# Patient Record
Sex: Female | Born: 1935 | Race: White | Hispanic: No | Marital: Married
Health system: Midwestern US, Community
[De-identification: ages and names within clinical notes are randomized; demographics above are authoritative.]

## PROBLEM LIST (undated history)

## (undated) DIAGNOSIS — T753XXA Motion sickness, initial encounter: Secondary | ICD-10-CM

## (undated) DIAGNOSIS — K219 Gastro-esophageal reflux disease without esophagitis: Secondary | ICD-10-CM

## (undated) DIAGNOSIS — F32A Depression, unspecified: Secondary | ICD-10-CM

## (undated) DIAGNOSIS — T4145XA Adverse effect of unspecified anesthetic, initial encounter: Secondary | ICD-10-CM

## (undated) DIAGNOSIS — I10 Essential (primary) hypertension: Secondary | ICD-10-CM

## (undated) DIAGNOSIS — Z8719 Personal history of other diseases of the digestive system: Secondary | ICD-10-CM

## (undated) DIAGNOSIS — K589 Irritable bowel syndrome without diarrhea: Secondary | ICD-10-CM

## (undated) DIAGNOSIS — D509 Iron deficiency anemia, unspecified: Secondary | ICD-10-CM

## (undated) DIAGNOSIS — M1990 Unspecified osteoarthritis, unspecified site: Secondary | ICD-10-CM

## (undated) DIAGNOSIS — T8859XA Other complications of anesthesia, initial encounter: Secondary | ICD-10-CM

## (undated) DIAGNOSIS — B029 Zoster without complications: Secondary | ICD-10-CM

## (undated) DIAGNOSIS — H409 Unspecified glaucoma: Secondary | ICD-10-CM

## (undated) DIAGNOSIS — M810 Age-related osteoporosis without current pathological fracture: Secondary | ICD-10-CM

## (undated) DIAGNOSIS — C801 Malignant (primary) neoplasm, unspecified: Secondary | ICD-10-CM

## (undated) DIAGNOSIS — E7800 Pure hypercholesterolemia, unspecified: Secondary | ICD-10-CM

## (undated) DIAGNOSIS — N6009 Solitary cyst of unspecified breast: Secondary | ICD-10-CM

## (undated) DIAGNOSIS — F329 Major depressive disorder, single episode, unspecified: Secondary | ICD-10-CM

## (undated) DIAGNOSIS — R519 Headache, unspecified: Secondary | ICD-10-CM

## (undated) DIAGNOSIS — Z923 Personal history of irradiation: Secondary | ICD-10-CM

## (undated) DIAGNOSIS — Z972 Presence of dental prosthetic device (complete) (partial): Secondary | ICD-10-CM

## (undated) DIAGNOSIS — M2550 Pain in unspecified joint: Secondary | ICD-10-CM

## (undated) DIAGNOSIS — R51 Headache: Secondary | ICD-10-CM

## (undated) HISTORY — PX: EXCISION VAGINAL CYST: SHX5825

## (undated) HISTORY — DX: Solitary cyst of unspecified breast: N60.09

## (undated) HISTORY — PX: CARDIAC CATHETERIZATION: SHX172

## (undated) HISTORY — DX: Zoster without complications: B02.9

## (undated) HISTORY — PX: HAND SURGERY: SHX662

## (undated) HISTORY — DX: Gastro-esophageal reflux disease without esophagitis: K21.9

## (undated) HISTORY — DX: Irritable bowel syndrome, unspecified: K58.9

## (undated) HISTORY — DX: Essential (primary) hypertension: I10

## (undated) HISTORY — PX: COLONOSCOPY: SHX174

## (undated) HISTORY — DX: Pain in unspecified joint: M25.50

## (undated) HISTORY — DX: Unspecified glaucoma: H40.9

## (undated) HISTORY — PX: CATARACT EXTRACTION W/ INTRAOCULAR LENS  IMPLANT, BILATERAL: SHX1307

## (undated) HISTORY — DX: Malignant (primary) neoplasm, unspecified: C80.1

---

## 1974-12-26 HISTORY — PX: ABDOMINAL HYSTERECTOMY: SHX81

## 2004-10-05 ENCOUNTER — Ambulatory Visit: Payer: Self-pay | Admitting: Family Medicine

## 2005-11-03 ENCOUNTER — Ambulatory Visit: Payer: Self-pay | Admitting: Family Medicine

## 2006-07-25 ENCOUNTER — Other Ambulatory Visit: Payer: Self-pay

## 2006-07-25 ENCOUNTER — Emergency Department: Payer: Self-pay | Admitting: Emergency Medicine

## 2006-11-06 ENCOUNTER — Ambulatory Visit: Payer: Self-pay | Admitting: Family Medicine

## 2007-11-13 ENCOUNTER — Ambulatory Visit: Payer: Self-pay | Admitting: Family Medicine

## 2008-12-03 ENCOUNTER — Ambulatory Visit: Payer: Self-pay | Admitting: Family Medicine

## 2008-12-15 ENCOUNTER — Ambulatory Visit: Payer: Self-pay | Admitting: Family Medicine

## 2009-01-15 ENCOUNTER — Ambulatory Visit: Payer: Self-pay | Admitting: Family Medicine

## 2009-07-31 ENCOUNTER — Ambulatory Visit: Payer: Self-pay | Admitting: Family Medicine

## 2009-11-10 ENCOUNTER — Ambulatory Visit: Payer: Self-pay | Admitting: Unknown Physician Specialty

## 2010-01-20 ENCOUNTER — Ambulatory Visit: Payer: Self-pay | Admitting: Family Medicine

## 2010-09-16 ENCOUNTER — Ambulatory Visit: Payer: Self-pay | Admitting: Family Medicine

## 2011-05-11 ENCOUNTER — Ambulatory Visit: Payer: Self-pay | Admitting: Family Medicine

## 2012-04-13 ENCOUNTER — Ambulatory Visit: Payer: Self-pay | Admitting: Family Medicine

## 2012-05-22 ENCOUNTER — Ambulatory Visit: Payer: Self-pay | Admitting: Family Medicine

## 2012-05-23 ENCOUNTER — Ambulatory Visit: Payer: Self-pay | Admitting: Family Medicine

## 2012-09-20 ENCOUNTER — Ambulatory Visit: Payer: Self-pay | Admitting: Family Medicine

## 2012-12-26 DIAGNOSIS — Z923 Personal history of irradiation: Secondary | ICD-10-CM

## 2012-12-26 DIAGNOSIS — C801 Malignant (primary) neoplasm, unspecified: Secondary | ICD-10-CM

## 2012-12-26 HISTORY — PX: BREAST LUMPECTOMY: SHX2

## 2012-12-26 HISTORY — PX: MM BREAST STEREO BX*R*R/S: HXRAD496

## 2012-12-26 HISTORY — DX: Personal history of irradiation: Z92.3

## 2012-12-26 HISTORY — DX: Malignant (primary) neoplasm, unspecified: C80.1

## 2012-12-26 HISTORY — PX: BREAST BIOPSY: SHX20

## 2013-01-23 ENCOUNTER — Ambulatory Visit: Payer: Self-pay | Admitting: Ophthalmology

## 2013-01-23 DIAGNOSIS — I10 Essential (primary) hypertension: Secondary | ICD-10-CM

## 2013-01-25 ENCOUNTER — Ambulatory Visit: Payer: Self-pay | Admitting: Ophthalmology

## 2013-03-18 ENCOUNTER — Ambulatory Visit: Payer: Self-pay | Admitting: Gastroenterology

## 2013-04-11 ENCOUNTER — Ambulatory Visit: Payer: Self-pay | Admitting: Cardiothoracic Surgery

## 2013-04-25 ENCOUNTER — Ambulatory Visit: Payer: Self-pay | Admitting: Cardiothoracic Surgery

## 2013-05-23 ENCOUNTER — Ambulatory Visit: Payer: Self-pay | Admitting: Family Medicine

## 2013-06-03 ENCOUNTER — Ambulatory Visit: Payer: Self-pay | Admitting: Family Medicine

## 2013-06-13 ENCOUNTER — Ambulatory Visit (INDEPENDENT_AMBULATORY_CARE_PROVIDER_SITE_OTHER): Payer: Medicare Other | Admitting: General Surgery

## 2013-06-13 ENCOUNTER — Other Ambulatory Visit: Payer: Self-pay

## 2013-06-13 ENCOUNTER — Encounter: Payer: Self-pay | Admitting: General Surgery

## 2013-06-13 VITALS — BP 122/62 | HR 62 | Resp 16 | Ht 63.0 in | Wt 164.0 lb

## 2013-06-13 DIAGNOSIS — N63 Unspecified lump in unspecified breast: Secondary | ICD-10-CM

## 2013-06-13 NOTE — Patient Instructions (Addendum)
The stereotactic procedure was reviewed with the patient. The potential for bleeding, infection and pain was reviewed. At this time, the benefits outweigh the risk, and the patient is amenable to proceed.  If pathology normal follow up in 6 months with right mammogram and office visit.  Patient has been scheduled for a right breast stereotactic biopsy at [facility] for 06-24-13 at 3 pm. She will check-in at the [facility] at 2:30 pm. This patient is aware of date, time, and instructions. Patient verbalizes understanding.

## 2013-06-13 NOTE — Progress Notes (Signed)
Patient ID: Abigail Shaw, female   DOB: [birth year], 78 y.o.   MRN: [id number]  Chief Complaint  Patient presents with  . Breast Problem    new patient category 4 mammogram    HPI Abigail Shaw is a 78 y.o. female who presents for a breast evaluation. The most recent mammogram was done May 23 2013 and June 03 2013 with added views .  Patient states she "feels a cyst" for many years and has not changed in the right breast. Patient does perform regular self breast checks and gets regular mammograms done. Her mother has a history of breast cancer age 74 passed at age 1. Left ankle tendonitis and wearing a support boots.   HPI  Past Medical History  Diagnosis Date  . Hypertension   . IBS (irritable bowel syndrome)   . Breast cyst   . GERD (gastroesophageal reflux disease)   . Glaucoma   . Joint pain     Past Surgical History  Procedure Laterality Date  . Abdominal hysterectomy  1976    Family History  Problem Relation Age of Onset  . Cancer Mother     age 77 breast  . Cancer Brother     prostate/lung/ liver /spleen    Social History History  Substance Use Topics  . Smoking status: Never Smoker   . Smokeless tobacco: Never Used  . Alcohol Use: No    No Known Allergies  Current Outpatient Prescriptions  Medication Sig Dispense Refill  . amLODipine (NORVASC) 2.5 MG tablet Take 2.5 mg by mouth daily.      Marland Kitchen FLUoxetine (PROZAC) 20 MG capsule Take 20 mg by mouth daily.      . fluticasone (VERAMYST) 27.5 MCG/SPRAY nasal spray Place 2 sprays into the nose daily.      . hydrochlorothiazide (MICROZIDE) 12.5 MG capsule Take 12.5 mg by mouth daily.      Marland Kitchen LORazepam (ATIVAN) 0.5 MG tablet Take 0.5 mg by mouth every 8 (eight) hours.      Marland Kitchen losartan (COZAAR) 50 MG tablet Take 50 mg by mouth daily.      . Multiple Vitamins-Minerals (BIOTECT PLUS PO) Take by mouth.      . RABEprazole (ACIPHEX) 20 MG tablet Take 20 mg by mouth daily.      . rosuvastatin (CRESTOR) 5 MG tablet  Take 5 mg by mouth daily.      Marland Kitchen tetrahydrozoline 0.05 % ophthalmic solution Place 1 drop into both eyes.       No current facility-administered medications for this visit.    Review of Systems Review of Systems  Constitutional: Negative.   Respiratory: Negative.   Cardiovascular: Negative.     Blood pressure 122/62, pulse 62, resp. rate 16, height 5\' 3"  (1.6 m), weight 164 lb (74.39 kg).  Physical Exam Physical Exam  Constitutional: She is oriented to person, place, and time. She appears well-developed and well-nourished.  Eyes: Conjunctivae are normal. No scleral icterus.  Neck: Neck supple. No thyromegaly present.  Cardiovascular: Normal rate and regular rhythm.   No murmur heard. Pulmonary/Chest: Effort normal and breath sounds normal. Right breast exhibits mass and tenderness. Right breast exhibits no inverted nipple, no nipple discharge and no skin change. Left breast exhibits tenderness. Left breast exhibits no inverted nipple, no mass, no nipple discharge and no skin change.  Lymphadenopathy:    She has no cervical adenopathy.    She has no axillary adenopathy.  Neurological: She is alert and oriented to person, place,  and time.  Skin: Skin is warm and dry.  mid upper outer quadrant right breast with 1 cm subtle thickening non focal tenderness bilateral breast  Data Reviewed Mammogram and ultrasound.  Ultrasound findings are vague.  Mammogram shows a more  apparent density in upper outer quadrant right breast.  Assessment   as noted above    Plan    Stereotatic right breast biopsy    Patient has been scheduled for a right breast stereotactic biopsy at [facility] for 06-24-13 at 3 pm. She will check-in at the [facility] at 2:30 pm. This patient is aware of date, time, and instructions. Patient verbalizes understanding.    Mansi Tokar G 06/13/2013, 7:29 PM

## 2013-06-24 ENCOUNTER — Other Ambulatory Visit: Payer: Self-pay | Admitting: General Surgery

## 2013-06-24 ENCOUNTER — Ambulatory Visit: Payer: Self-pay | Admitting: General Surgery

## 2013-06-24 DIAGNOSIS — R928 Other abnormal and inconclusive findings on diagnostic imaging of breast: Secondary | ICD-10-CM

## 2013-06-25 ENCOUNTER — Encounter: Payer: Self-pay | Admitting: General Surgery

## 2013-06-26 ENCOUNTER — Encounter: Payer: Self-pay | Admitting: General Surgery

## 2013-06-26 ENCOUNTER — Ambulatory Visit (INDEPENDENT_AMBULATORY_CARE_PROVIDER_SITE_OTHER): Payer: Medicare Other | Admitting: General Surgery

## 2013-06-26 VITALS — BP 120/74 | HR 78 | Resp 14 | Ht 63.0 in | Wt 167.0 lb

## 2013-06-26 DIAGNOSIS — C50411 Malignant neoplasm of upper-outer quadrant of right female breast: Secondary | ICD-10-CM

## 2013-06-26 DIAGNOSIS — C50419 Malignant neoplasm of upper-outer quadrant of unspecified female breast: Secondary | ICD-10-CM

## 2013-06-26 NOTE — Addendum Note (Signed)
Addended by: Kieth Brightly on: 06/26/2013 02:49 PM   Modules accepted: Orders

## 2013-06-26 NOTE — Progress Notes (Signed)
Patient ID: Abigail Shaw, female   DOB: [birth year], 78 y.o.   MRN: [id number] Pt had stereo biopsy of right breast density in UOQ 2 days ago. Path- invasive carcinoma with mucinous features. Pt here for discussion. Discussed in full roles of local treatment, mastectomy, node involvement and chemotharepy. Also advised on ER,PR and Her 2 neu. After discussion recommended lumpectomy with sentinel node biopsy. Procedure and risks explained. Possible axillary dissection also explained. Pt is agreeable.

## 2013-06-26 NOTE — Patient Instructions (Signed)
Surgery to be scheduled. Pt to come day before surgery for preop [place] to see if biopsy cavity is identifiable-if not will need wire loc with mammography

## 2013-06-27 ENCOUNTER — Encounter: Payer: Self-pay | Admitting: General Surgery

## 2013-06-27 ENCOUNTER — Ambulatory Visit: Payer: Self-pay | Admitting: General Surgery

## 2013-06-27 LAB — CBC WITH DIFFERENTIAL/PLATELET
Basophil #: 0 10*3/uL (ref 0.0–0.1)
Basophil %: 0.5 %
Eosinophil #: 0.1 10*3/uL (ref 0.0–0.7)
Eosinophil %: 1.7 %
HGB: 11.7 g/dL — ABNORMAL LOW (ref 12.0–16.0)
Lymphocyte #: 1.9 10*3/uL (ref 1.0–3.6)
MCH: 27 pg (ref 26.0–34.0)
MCHC: 34 g/dL (ref 32.0–36.0)
Monocyte %: 9 %
RBC: 4.31 10*6/uL (ref 3.80–5.20)
RDW: 15.7 % — ABNORMAL HIGH (ref 11.5–14.5)
WBC: 6.3 10*3/uL (ref 3.6–11.0)

## 2013-06-27 LAB — COMPREHENSIVE METABOLIC PANEL
Albumin: 3.5 g/dL (ref 3.4–5.0)
Alkaline Phosphatase: 84 U/L (ref 50–136)
Anion Gap: 6 — ABNORMAL LOW (ref 7–16)
Bilirubin,Total: 0.3 mg/dL (ref 0.2–1.0)
Calcium, Total: 9.1 mg/dL (ref 8.5–10.1)
Co2: 28 mmol/L (ref 21–32)
Osmolality: 264 (ref 275–301)
SGOT(AST): 22 U/L (ref 15–37)
SGPT (ALT): 27 U/L (ref 12–78)
Sodium: 132 mmol/L — ABNORMAL LOW (ref 136–145)
Total Protein: 7.2 g/dL (ref 6.4–8.2)

## 2013-07-01 ENCOUNTER — Encounter: Payer: Self-pay | Admitting: General Surgery

## 2013-07-01 ENCOUNTER — Ambulatory Visit (INDEPENDENT_AMBULATORY_CARE_PROVIDER_SITE_OTHER): Payer: Medicare Other | Admitting: General Surgery

## 2013-07-01 ENCOUNTER — Other Ambulatory Visit: Payer: Self-pay

## 2013-07-01 VITALS — BP 134/60 | HR 68 | Resp 12 | Ht 63.0 in | Wt 166.0 lb

## 2013-07-01 DIAGNOSIS — C50411 Malignant neoplasm of upper-outer quadrant of right female breast: Secondary | ICD-10-CM

## 2013-07-01 DIAGNOSIS — C50419 Malignant neoplasm of upper-outer quadrant of unspecified female breast: Secondary | ICD-10-CM

## 2013-07-01 LAB — CANCER ANTIGEN 27.29: CA 27.29: 15.6 U/mL (ref 0.0–38.6)

## 2013-07-01 NOTE — Progress Notes (Signed)
Patient ID: Abigail Shaw, female   DOB: 1935-10-18, 78 y.o.   MRN: [id number]  Chief Complaint  Patient presents with  . Other    breast ultrasound    HPI Abigail Shaw is a 78 y.o. female  here today for an pre op right  breast ultrasound. HPI  Past Medical History  Diagnosis Date  . Hypertension   . IBS (irritable bowel syndrome)   . Breast cyst   . GERD (gastroesophageal reflux disease)   . Glaucoma   . Joint pain     Past Surgical History  Procedure Laterality Date  . Abdominal hysterectomy  1976  . Mm breast stereo bx*r*r/s Right 2014    Family History  Problem Relation Age of Onset  . Cancer Mother     age 60 breast  . Cancer Brother     prostate/lung/ liver /spleen    Social History History  Substance Use Topics  . Smoking status: Never Smoker   . Smokeless tobacco: Never Used  . Alcohol Use: No    No Known Allergies  Current Outpatient Prescriptions  Medication Sig Dispense Refill  . amLODipine (NORVASC) 2.5 MG tablet Take 2.5 mg by mouth daily.      . dorzolamide-timolol (COSOPT) 22.3-6.8 MG/ML ophthalmic solution Place 1 drop into both eyes.      Marland Kitchen FLUoxetine (PROZAC) 20 MG capsule Take 40 mg by mouth daily.       . fluticasone (VERAMYST) 27.5 MCG/SPRAY nasal spray Place 2 sprays into the nose daily.      . hydrochlorothiazide (MICROZIDE) 12.5 MG capsule Take 12.5 mg by mouth daily.      Marland Kitchen LORazepam (ATIVAN) 0.5 MG tablet Take 0.5 mg by mouth every 8 (eight) hours.      Marland Kitchen losartan (COZAAR) 50 MG tablet Take 50 mg by mouth daily.      Marland Kitchen LUMIGAN 0.01 % SOLN Place 1 drop into both eyes.      . meloxicam (MOBIC) 7.5 MG tablet Take 7.5 mg by mouth daily.      . Multiple Vitamins-Minerals (BIOTECT PLUS PO) Take by mouth.      . RABEprazole (ACIPHEX) 20 MG tablet Take 20 mg by mouth daily.      . rosuvastatin (CRESTOR) 5 MG tablet Take 5 mg by mouth daily.      Marland Kitchen tetrahydrozoline 0.05 % ophthalmic solution Place 1 drop into both eyes.       No  current facility-administered medications for this visit.    Review of Systems Review of Systems  Constitutional: Negative.   Respiratory: Negative.     Blood pressure 134/60, pulse 68, resp. rate 12, height 5\' 3"  (1.6 m), weight 166 lb (75.297 kg).  Physical Exam Physical Exam  Constitutional: She is oriented to person, place, and time. She appears well-developed and well-nourished.  Neurological: She is alert and oriented to person, place, and time.  Skin: Skin is warm and dry.  Mild  Bruising UOQ right breast  Data Reviewed none  Assessment    Right breast ultrasound-biopsy cavity is well seen.    Plan    Patient to have a  right breast lumpectomy with sentinel node biopsy.Dose not need mammogram and  wire loc.        Abigail Shaw 07/01/2013, 8:21 AM

## 2013-07-01 NOTE — Patient Instructions (Addendum)
Patient to have an lumpectomy with sentinel node biopsy as planned.

## 2013-07-02 ENCOUNTER — Encounter: Payer: Self-pay | Admitting: General Surgery

## 2013-07-02 ENCOUNTER — Ambulatory Visit: Payer: Self-pay | Admitting: General Surgery

## 2013-07-02 DIAGNOSIS — C50419 Malignant neoplasm of upper-outer quadrant of unspecified female breast: Secondary | ICD-10-CM

## 2013-07-02 HISTORY — PX: BREAST SURGERY: SHX581

## 2013-07-03 ENCOUNTER — Encounter: Payer: Self-pay | Admitting: General Surgery

## 2013-07-03 LAB — PATHOLOGY REPORT

## 2013-07-04 ENCOUNTER — Encounter: Payer: Self-pay | Admitting: General Surgery

## 2013-07-05 LAB — PATHOLOGY REPORT

## 2013-07-09 ENCOUNTER — Ambulatory Visit: Payer: Medicare Other

## 2013-07-15 ENCOUNTER — Ambulatory Visit (INDEPENDENT_AMBULATORY_CARE_PROVIDER_SITE_OTHER): Payer: Medicare Other | Admitting: General Surgery

## 2013-07-15 ENCOUNTER — Encounter: Payer: Self-pay | Admitting: General Surgery

## 2013-07-15 VITALS — BP 132/78 | HR 66 | Resp 14 | Ht 63.0 in | Wt 169.0 lb

## 2013-07-15 DIAGNOSIS — C50419 Malignant neoplasm of upper-outer quadrant of unspecified female breast: Secondary | ICD-10-CM

## 2013-07-15 DIAGNOSIS — C50411 Malignant neoplasm of upper-outer quadrant of right female breast: Secondary | ICD-10-CM

## 2013-07-15 NOTE — Progress Notes (Signed)
Patient ID: Abigail Shaw, female   DOB: July 25, 1935, 78 y.o.   MRN: [id number]  Chief Complaint  Patient presents with  . Other    HPI Abigail Shaw is a 78 y.o. female here today for follow up from her right lumpectomy and sentinal biopsy .  HPI  Past Medical History  Diagnosis Date  . Hypertension   . IBS (irritable bowel syndrome)   . Breast cyst   . GERD (gastroesophageal reflux disease)   . Glaucoma   . Joint pain     Past Surgical History  Procedure Laterality Date  . Abdominal hysterectomy  1976  . Mm breast stereo bx*r*r/s Right 2014  . Breast surgery Right 2014    with sentinal biopsy    Family History  Problem Relation Age of Onset  . Cancer Mother     age 76 breast  . Cancer Brother     prostate/lung/ liver /spleen    Social History History  Substance Use Topics  . Smoking status: Never Smoker   . Smokeless tobacco: Never Used  . Alcohol Use: No    No Known Allergies  Current Outpatient Prescriptions  Medication Sig Dispense Refill  . amLODipine (NORVASC) 2.5 MG tablet Take 2.5 mg by mouth daily.      . dorzolamide-timolol (COSOPT) 22.3-6.8 MG/ML ophthalmic solution Place 1 drop into both eyes.      Marland Kitchen ESTRACE VAGINAL 0.1 MG/GM vaginal cream Place 1 Applicatorful vaginally every morning.      Marland Kitchen FLUoxetine (PROZAC) 20 MG capsule Take 40 mg by mouth daily.       . fluticasone (VERAMYST) 27.5 MCG/SPRAY nasal spray Place 2 sprays into the nose daily.      . hydrochlorothiazide (MICROZIDE) 12.5 MG capsule Take 12.5 mg by mouth daily.      Marland Kitchen LORazepam (ATIVAN) 0.5 MG tablet Take 0.5 mg by mouth every 8 (eight) hours.      Marland Kitchen losartan (COZAAR) 50 MG tablet Take 50 mg by mouth daily.      Marland Kitchen LUMIGAN 0.01 % SOLN Place 1 drop into both eyes.      . meloxicam (MOBIC) 7.5 MG tablet Take 7.5 mg by mouth daily.      . Multiple Vitamins-Minerals (BIOTECT PLUS PO) Take by mouth.      . RABEprazole (ACIPHEX) 20 MG tablet Take 20 mg by mouth daily.      .  rosuvastatin (CRESTOR) 5 MG tablet Take 5 mg by mouth daily.      Marland Kitchen tetrahydrozoline 0.05 % ophthalmic solution Place 1 drop into both eyes.      . traMADol (ULTRAM) 50 MG tablet Take 1 tablet by mouth as needed.       No current facility-administered medications for this visit.    Review of Systems Review of Systems  Constitutional: Negative.   Respiratory: Negative.   Cardiovascular: Negative.     Blood pressure 132/78, pulse 66, resp. rate 14, height 5\' 3"  (1.6 m), weight 169 lb (76.658 kg).  Physical Exam Physical Exam  Constitutional: She is oriented to person, place, and time. She appears well-developed and well-nourished.  Pulmonary/Chest: Breath sounds normal.  Neurological: She is alert and oriented to person, place, and time.  Skin: Skin is warm and dry.  Right breast lumpectomy and axillary incisions are clean.   Data Reviewed Path- invasive CA 1.5cm size. Anterior margin pos. SN neg. ER/PR pos, Her 2 neg.  Assessment    Needs excision anterior margin(skin). Discussed fully with  pt. Also will benefit with Oncotype.     Plan    Pt agreeable to plan as above.        Adalae Baysinger G 07/15/2013, 5:26 PM

## 2013-07-15 NOTE — Patient Instructions (Addendum)
Patient to return as schedule  

## 2013-07-18 ENCOUNTER — Encounter: Payer: Self-pay | Admitting: General Surgery

## 2013-07-18 ENCOUNTER — Ambulatory Visit: Payer: Self-pay | Admitting: General Surgery

## 2013-07-18 DIAGNOSIS — C50419 Malignant neoplasm of upper-outer quadrant of unspecified female breast: Secondary | ICD-10-CM

## 2013-07-18 HISTORY — PX: BREAST SURGERY: SHX581

## 2013-07-21 LAB — PATHOLOGY REPORT

## 2013-07-25 ENCOUNTER — Encounter: Payer: Self-pay | Admitting: General Surgery

## 2013-07-25 ENCOUNTER — Ambulatory Visit (INDEPENDENT_AMBULATORY_CARE_PROVIDER_SITE_OTHER): Payer: Medicare Other | Admitting: General Surgery

## 2013-07-25 VITALS — BP 162/82 | HR 84 | Resp 16 | Ht 63.0 in | Wt 167.0 lb

## 2013-07-25 DIAGNOSIS — C50411 Malignant neoplasm of upper-outer quadrant of right female breast: Secondary | ICD-10-CM

## 2013-07-25 DIAGNOSIS — C50419 Malignant neoplasm of upper-outer quadrant of unspecified female breast: Secondary | ICD-10-CM

## 2013-07-25 NOTE — Patient Instructions (Addendum)
Patient to see Dr. Rushie Chestnut at the [facility] on Monday, 07-29-13 at 1 pm.

## 2013-07-25 NOTE — Progress Notes (Signed)
Patient ID: Abigail Shaw, female   DOB: 01-04-1935, 78 y.o.   MRN: [id number]  Chief Complaint  Patient presents with  . Routine Post Op    HPI Abigail Shaw is a 78 y.o. female. Patient here today for follow up right lumpectomy rexcision done 07-18-13. Final margins are clear. She  States she had more pain yesterday in right postop area controlled with pain medication.  HPI  Past Medical History  Diagnosis Date  . Hypertension   . IBS (irritable bowel syndrome)   . Breast cyst   . GERD (gastroesophageal reflux disease)   . Glaucoma   . Joint pain     Past Surgical History  Procedure Laterality Date  . Abdominal hysterectomy  1976  . Mm breast stereo bx*r*r/s Right 2014  . Breast surgery Right 07-02-2013    with sentinal biopsy  . Breast surgery Right 07-18-13    Family History  Problem Relation Age of Onset  . Cancer Mother     age 75 breast  . Cancer Brother     prostate/lung/ liver /spleen    Social History History  Substance Use Topics  . Smoking status: Never Smoker   . Smokeless tobacco: Never Used  . Alcohol Use: No    No Known Allergies  Current Outpatient Prescriptions  Medication Sig Dispense Refill  . amLODipine (NORVASC) 2.5 MG tablet Take 2.5 mg by mouth daily.      . dorzolamide-timolol (COSOPT) 22.3-6.8 MG/ML ophthalmic solution Place 1 drop into both eyes.      Marland Kitchen FLUoxetine (PROZAC) 20 MG capsule Take 40 mg by mouth daily.       . fluticasone (FLONASE) 50 MCG/ACT nasal spray       . fluticasone (VERAMYST) 27.5 MCG/SPRAY nasal spray Place 2 sprays into the nose daily.      . hydrochlorothiazide (MICROZIDE) 12.5 MG capsule Take 12.5 mg by mouth daily.      Marland Kitchen LORazepam (ATIVAN) 0.5 MG tablet Take 0.5 mg by mouth every 8 (eight) hours.      Marland Kitchen losartan (COZAAR) 50 MG tablet Take 50 mg by mouth daily.      Marland Kitchen LUMIGAN 0.01 % SOLN Place 1 drop into both eyes.      . Multiple Vitamins-Minerals (BIOTECT PLUS PO) Take by mouth.      . RABEprazole  (ACIPHEX) 20 MG tablet Take 20 mg by mouth daily.      . rosuvastatin (CRESTOR) 5 MG tablet Take 5 mg by mouth daily.      Marland Kitchen tetrahydrozoline 0.05 % ophthalmic solution Place 1 drop into both eyes.      . traMADol (ULTRAM) 50 MG tablet Take 1 tablet by mouth as needed.       No current facility-administered medications for this visit.    Review of Systems Review of Systems  Constitutional: Negative.   Respiratory: Negative.   Cardiovascular: Negative.     Blood pressure 162/82, pulse 84, resp. rate 16, height 5\' 3"  (1.6 m), weight 167 lb (75.751 kg).  Physical Exam Physical Exam  Constitutional: She is oriented to person, place, and time. She appears well-developed and well-nourished.  Neurological: She is alert and oriented to person, place, and time.  Skin: Skin is warm and dry.   Right breast incision healing well. No signs of infection. Data Reviewed  none  Assessment    Right breast incision healing and signs to suggest infection.    Plan    Refer to Dr Rushie Chestnut for  possible mammosite radiation therapy.   Oncotype results pending.    Patient to see Dr. Rushie Chestnut at the [facility] on Monday, 07-29-13 at 1 pm. She is aware of date, time, and instructions.    SANKAR,SEEPLAPUTHUR G 07/26/2013, 11:42 AM

## 2013-07-26 ENCOUNTER — Ambulatory Visit: Payer: Self-pay | Admitting: Radiation Oncology

## 2013-07-26 ENCOUNTER — Encounter: Payer: Self-pay | Admitting: General Surgery

## 2013-07-30 ENCOUNTER — Telehealth: Payer: Self-pay | Admitting: *Deleted

## 2013-07-30 MED ORDER — CEFADROXIL 500 MG PO CAPS: 500.0000 mg | ORAL_CAPSULE | Freq: Two times a day (BID) | ORAL | Status: DC

## 2013-07-30 NOTE — Telephone Encounter (Signed)
Mammosite schedule reviewed with the patient Placement   08-01-13    at [facility] at 8:15 Scan 08-01-13 Treat 08-05-13 to 08-09-13 Aware Toniann Fail will be calling her for more details Aware of AT[facility] and directions reviewed. Aware no showers and to wear her bra while mammosite in place. Pt agrees.

## 2013-07-30 NOTE — Telephone Encounter (Signed)
Message copied by Currie Paris on Tue Jul 30, 2013  8:27 AM ------      Message from: Erich Montane      Created: Mon Jul 29, 2013  2:18 PM      Regarding: Dr.Crystal Office      Contact: [phone]       Toniann Fail From Dr.Crystal office wanted you to call her back when you get back in the office, its regarding the patient Abigail Shaw. Thanks!  ------

## 2013-07-30 NOTE — Telephone Encounter (Signed)
Mammosite arrangements

## 2013-07-31 ENCOUNTER — Ambulatory Visit: Payer: Self-pay | Admitting: Unknown Physician Specialty

## 2013-08-01 ENCOUNTER — Encounter: Payer: Self-pay | Admitting: General Surgery

## 2013-08-01 ENCOUNTER — Ambulatory Visit (INDEPENDENT_AMBULATORY_CARE_PROVIDER_SITE_OTHER): Payer: Medicare Other | Admitting: General Surgery

## 2013-08-01 VITALS — BP 124/64 | HR 60 | Resp 12 | Ht 63.0 in | Wt 168.0 lb

## 2013-08-01 DIAGNOSIS — C50419 Malignant neoplasm of upper-outer quadrant of unspecified female breast: Secondary | ICD-10-CM

## 2013-08-01 DIAGNOSIS — C50411 Malignant neoplasm of upper-outer quadrant of right female breast: Secondary | ICD-10-CM

## 2013-08-01 HISTORY — PX: BREAST MAMMOSITE: SHX5264

## 2013-08-01 MED ORDER — FLUCONAZOLE 150 MG PO TABS: 150.0000 mg | ORAL_TABLET | Freq: Once | ORAL | Status: DC

## 2013-08-01 NOTE — Progress Notes (Signed)
Patient here today for right breast mammosite placement.  EXCISIONAL BREAST BIOPSY REPORT  Name:  Abigail Shaw DOB:  [birth year]  Vital signs:BP 124/64  Pulse 60  Resp 12  Ht 5\' 3"  (1.6 m)  Wt 168 lb (76.204 kg)  BMI 29.77 kg/m2  The patient has been found to be an acceptable candidate for MammoSite radiation treatment for her recently diagnosed breast cancer of the  Right  breast after consultation with Carmina Miller, M.D. from radiation oncology.  The patient returns today for planned insertion of a treatment balloon.  Pre-procedure ultrasound has shown an adequate buffer between the skin and the underlying cavity from her wide excision.  A total of 8cc of 1% Xylocaine with 0.5% Marcaine was used for local anesthesia and was well tolerated.  The breast was then prepped with chloroprep  solution x3 and draped. Under ultrasound guidance the 4-5 centimeter   MammoSite balloon was inserted through the end of previous incision. This was inflated to a volume of 40 cc with normal saline mixed with Omnipaque.  The procedure was well tolerated.  Scant bleeding was noted.  Antibiotic cream was placed at the insertion site and a gauze pad applied.  She has an appointment with radiation oncology staff for a CT scan of the breast to confirm balloon size and skin margins in the near future.  CC:  HEDRICK,JAMES, MD CC: Carmina Miller, M.D.  SANKAR,SEEPLAPUTHUR G

## 2013-08-01 NOTE — Patient Instructions (Addendum)
Patient care kit given to patient.  Instructed no showers, sponge bath while mammosite in place, take antibiotic. Follow up with Cancer Center as arranged. Discussed wearing your bra for support at all times.  Follow up at Cancer Center as scheduled 

## 2013-08-05 ENCOUNTER — Encounter: Payer: Self-pay | Admitting: General Surgery

## 2013-08-06 LAB — ER/PR,IMMUNOHISTOCHEM,PARAFFIN: Progesterone Recp IP: 90 %

## 2013-08-06 LAB — HER-2 / NEU, FISH
Avg Num CEP17 probes/nucleus:: 1.8
Avg Num Her-2 signals/nucleus:: 2.1
Number of Observers:: 2

## 2013-08-09 LAB — COMPREHENSIVE METABOLIC PANEL
Albumin: 3.6 g/dL (ref 3.4–5.0)
Alkaline Phosphatase: 87 U/L (ref 50–136)
Anion Gap: 2 — ABNORMAL LOW (ref 7–16)
BUN: 13 mg/dL (ref 7–18)
Co2: 31 mmol/L (ref 21–32)
EGFR (African American): >60
Glucose: 99 mg/dL (ref 65–99)
Osmolality: 270 (ref 275–301)
SGOT(AST): 30 U/L (ref 15–37)

## 2013-08-09 LAB — CBC CANCER CENTER
Basophil #: 0 x10 3/mm (ref 0.0–0.1)
Eosinophil #: 0.1 x10 3/mm (ref 0.0–0.7)
HGB: 12.2 g/dL (ref 12.0–16.0)
Lymphocyte #: 1.4 x10 3/mm (ref 1.0–3.6)
MCHC: 33.5 g/dL (ref 32.0–36.0)
MCV: 81 fL (ref 80–100)
Monocyte %: 9.5 %
Platelet: 262 x10 3/mm (ref 150–440)
RDW: 15.9 % — ABNORMAL HIGH (ref 11.5–14.5)

## 2013-08-10 LAB — CANCER ANTIGEN 27.29: CA 27.29: 11 U/mL (ref 0.0–38.6)

## 2013-08-14 ENCOUNTER — Encounter: Payer: Self-pay | Admitting: General Surgery

## 2013-08-14 ENCOUNTER — Ambulatory Visit (INDEPENDENT_AMBULATORY_CARE_PROVIDER_SITE_OTHER): Payer: Medicare Other | Admitting: General Surgery

## 2013-08-14 VITALS — BP 132/80 | HR 66 | Resp 14 | Ht 63.0 in | Wt 168.0 lb

## 2013-08-14 DIAGNOSIS — C50411 Malignant neoplasm of upper-outer quadrant of right female breast: Secondary | ICD-10-CM

## 2013-08-14 DIAGNOSIS — C50419 Malignant neoplasm of upper-outer quadrant of unspecified female breast: Secondary | ICD-10-CM

## 2013-08-14 NOTE — Progress Notes (Signed)
Patient ID: Abigail Shaw, female   DOB: 09-30-1935, 78 y.o.   MRN: [id number]  Chief Complaint  Patient presents with  . Follow-up    right mammosite treatment    HPI Abigail Shaw is a 78 y.o. female. Patient here today following up from her recent mammosite treatment right breast.  States treatments went well, no new issues. She has prescription for anastrozole but has not picked it up yet. No chemotherapy needed, followed by Dr. Sherrlyn Hock.  HPI  Past Medical History  Diagnosis Date  . Hypertension   . IBS (irritable bowel syndrome)   . Breast cyst   . GERD (gastroesophageal reflux disease)   . Glaucoma   . Joint pain     Past Surgical History  Procedure Laterality Date  . Abdominal hysterectomy  1976  . Mm breast stereo bx*r*r/s Right 2014  . Breast surgery Right 07-02-2013    with sentinal biopsy  . Breast surgery Right 07-18-13  . Breast mammosite Right 08-01-13    Family History  Problem Relation Age of Onset  . Cancer Mother     age 61 breast  . Cancer Brother     prostate/lung/ liver /spleen    Social History History  Substance Use Topics  . Smoking status: Never Smoker   . Smokeless tobacco: Never Used  . Alcohol Use: No    No Known Allergies  Current Outpatient Prescriptions  Medication Sig Dispense Refill  . alendronate (FOSAMAX) 70 MG tablet Take 70 mg by mouth every 7 (seven) days.       Marland Kitchen amLODipine (NORVASC) 2.5 MG tablet Take 2.5 mg by mouth daily.      Marland Kitchen anastrozole (ARIMIDEX) 1 MG tablet Take 1 mg by mouth daily.       . dorzolamide-timolol (COSOPT) 22.3-6.8 MG/ML ophthalmic solution Place 1 drop into both eyes.      Marland Kitchen FLUoxetine (PROZAC) 20 MG capsule Take 40 mg by mouth daily.       . fluticasone (FLONASE) 50 MCG/ACT nasal spray       . fluticasone (VERAMYST) 27.5 MCG/SPRAY nasal spray Place 2 sprays into the nose daily.      . hydrochlorothiazide (MICROZIDE) 12.5 MG capsule Take 12.5 mg by mouth daily.      Marland Kitchen LORazepam (ATIVAN) 0.5 MG  tablet Take 0.5 mg by mouth every 8 (eight) hours.      Marland Kitchen losartan (COZAAR) 50 MG tablet Take 50 mg by mouth daily.      Marland Kitchen LUMIGAN 0.01 % SOLN Place 1 drop into both eyes.      . Multiple Vitamins-Minerals (BIOTECT PLUS PO) Take by mouth.      . RABEprazole (ACIPHEX) 20 MG tablet Take 20 mg by mouth daily.      . rosuvastatin (CRESTOR) 5 MG tablet Take 5 mg by mouth daily.      Marland Kitchen tetrahydrozoline 0.05 % ophthalmic solution Place 1 drop into both eyes.       No current facility-administered medications for this visit.    Review of Systems Review of Systems  Constitutional: Negative.   Respiratory: Positive for cough.   Cardiovascular: Negative.   Neurological: Positive for headaches.    Blood pressure 132/80, pulse 66, resp. rate 14, height 5\' 3"  (1.6 m), weight 168 lb (76.204 kg).  Physical Exam Physical Exam  Constitutional: She is oriented to person, place, and time. She appears well-developed and well-nourished.  Pulmonary/Chest:  Small amount skin irritation noted at previous mammosite area.  Neurological:  She is alert and oriented to person, place, and time.  Skin: Skin is warm and dry.    Data Reviewed none  Assessment    As above. Pt's oncotype was 18. This only provided a very  small benefit with chemo.     Plan    3 weeks       SANKAR,SEEPLAPUTHUR G 08/15/2013, 6:21 AM

## 2013-08-14 NOTE — Patient Instructions (Addendum)
Continue self breast exams. Call office for any new breast issues or concerns. 

## 2013-08-15 ENCOUNTER — Encounter: Payer: Self-pay | Admitting: General Surgery

## 2013-08-26 ENCOUNTER — Ambulatory Visit: Payer: Self-pay | Admitting: Radiation Oncology

## 2013-09-09 ENCOUNTER — Ambulatory Visit: Payer: Medicare Other | Admitting: General Surgery

## 2013-09-17 ENCOUNTER — Ambulatory Visit (INDEPENDENT_AMBULATORY_CARE_PROVIDER_SITE_OTHER): Payer: Self-pay | Admitting: General Surgery

## 2013-09-17 ENCOUNTER — Encounter: Payer: Self-pay | Admitting: General Surgery

## 2013-09-17 VITALS — BP 150/82 | HR 64 | Resp 12 | Ht 63.0 in | Wt 168.0 lb

## 2013-09-17 DIAGNOSIS — C50419 Malignant neoplasm of upper-outer quadrant of unspecified female breast: Secondary | ICD-10-CM

## 2013-09-17 DIAGNOSIS — C50411 Malignant neoplasm of upper-outer quadrant of right female breast: Secondary | ICD-10-CM

## 2013-09-17 NOTE — Progress Notes (Addendum)
Patient ID: Abigail Shaw, female   DOB: 1935/01/02, 78 y.o.   MRN: [id number]  Chief Complaint  Patient presents with  . Follow-up    3 week follow up mammosite    HPI Abigail Shaw is a 78 y.o. female who presents for a 3 week follow up of a right breast mammosite. She denies any new problems with her breasts at this time. She states for approximately 2-3 weeks she has an itch that is located on her neck and arms and has gradually gotten worse. She states this has started to keep her awake at night. She is not sure if this is a reaction to a medication that she is taking.   HPI  Past Medical History  Diagnosis Date  . Hypertension   . IBS (irritable bowel syndrome)   . Breast cyst   . GERD (gastroesophageal reflux disease)   . Glaucoma   . Joint pain   . Cancer 2014    right breast cancer    Past Surgical History  Procedure Laterality Date  . Abdominal hysterectomy  1976  . Mm breast stereo bx*r*r/s Right 2014  . Breast surgery Right 07-02-2013    with sentinal biopsy  . Breast surgery Right 07-18-13  . Breast mammosite Right 08-01-13    Family History  Problem Relation Age of Onset  . Cancer Mother     age 28 breast  . Cancer Brother     prostate/lung/ liver /spleen    Social History History  Substance Use Topics  . Smoking status: Never Smoker   . Smokeless tobacco: Never Used  . Alcohol Use: No    No Known Allergies  Current Outpatient Prescriptions  Medication Sig Dispense Refill  . alendronate (FOSAMAX) 70 MG tablet Take 70 mg by mouth every 7 (seven) days.       Marland Kitchen amLODipine (NORVASC) 2.5 MG tablet Take 2.5 mg by mouth daily.      Marland Kitchen anastrozole (ARIMIDEX) 1 MG tablet Take 1 mg by mouth daily.       . dorzolamide-timolol (COSOPT) 22.3-6.8 MG/ML ophthalmic solution Place 1 drop into both eyes.      . fluconazole (DIFLUCAN) 150 MG tablet Take 1 tablet by mouth daily.      Marland Kitchen FLUoxetine (PROZAC) 20 MG capsule Take 40 mg by mouth daily.       .  fluticasone (FLONASE) 50 MCG/ACT nasal spray       . fluticasone (VERAMYST) 27.5 MCG/SPRAY nasal spray Place 2 sprays into the nose daily.      . hydrochlorothiazide (MICROZIDE) 12.5 MG capsule Take 12.5 mg by mouth daily.      Marland Kitchen LORazepam (ATIVAN) 0.5 MG tablet Take 0.5 mg by mouth every 8 (eight) hours.      Marland Kitchen losartan (COZAAR) 50 MG tablet Take 50 mg by mouth daily.      Marland Kitchen LUMIGAN 0.01 % SOLN Place 1 drop into both eyes.      . Multiple Vitamins-Minerals (BIOTECT PLUS PO) Take by mouth.      . RABEprazole (ACIPHEX) 20 MG tablet Take 20 mg by mouth daily.      . rosuvastatin (CRESTOR) 5 MG tablet Take 5 mg by mouth daily.      Marland Kitchen tetrahydrozoline 0.05 % ophthalmic solution Place 1 drop into both eyes.       No current facility-administered medications for this visit.    Review of Systems Review of Systems  Constitutional: Negative.   Respiratory: Negative.  Cardiovascular: Negative.     Blood pressure 150/82, pulse 64, resp. rate 12, height 5\' 3"  (1.6 m), weight 168 lb (76.204 kg).  Physical Exam Physical Exam  Constitutional: She appears well-developed and well-nourished.  Pulmonary/Chest:  Lumpectomy site is clean with mild skin inflammation from radiation not unexpected.  No sign of seroma or infection.    Data Reviewed none  Assessment    Pt is doing well post right breast lumpectomy, SN biopsy. mammosite radiation. Currently using Anastrazole.  Symptom of irching-no rash. Not sure of cause.    Plan    Advised to get Dr. Darden Dates input on her anastrazole.-whether it may be causing itching.        SANKAR,SEEPLAPUTHUR G 09/17/2013, 1:17 PM

## 2013-09-17 NOTE — Patient Instructions (Addendum)
Patient to return in 4 months with a right breast diagnostic mammogram. Patient advised to continue self breast check and contact us if any new problems arise.

## 2013-10-30 ENCOUNTER — Ambulatory Visit: Payer: Medicare Other | Admitting: Podiatry

## 2013-11-26 ENCOUNTER — Other Ambulatory Visit: Payer: Medicare Other

## 2013-11-26 ENCOUNTER — Encounter: Payer: Self-pay | Admitting: General Surgery

## 2013-11-26 ENCOUNTER — Ambulatory Visit (INDEPENDENT_AMBULATORY_CARE_PROVIDER_SITE_OTHER): Payer: Medicare Other | Admitting: General Surgery

## 2013-11-26 VITALS — BP 126/70 | HR 74 | Resp 14 | Ht 63.0 in | Wt 166.0 lb

## 2013-11-26 DIAGNOSIS — N644 Mastodynia: Secondary | ICD-10-CM

## 2013-11-26 MED ORDER — SULFAMETHOXAZOLE-TMP DS 800-160 MG PO TABS: 1.0000 | ORAL_TABLET | Freq: Two times a day (BID) | ORAL | Status: DC

## 2013-11-26 NOTE — Progress Notes (Signed)
Patient ID: Abigail Shaw, female   DOB: 1935/12/17, 78 y.o.   MRN: [id number]  Chief Complaint  Patient presents with  . Other    Pain in righ breast    HPI Abigail Shaw is a 78 y.o. female here today for a evaluation.Patient states she is having redness and pain  in her right breast.Patient states the pain is going on for about two weeks,the redness she noticed it 11/23/13. HPI  Past Medical History  Diagnosis Date  . Hypertension   . IBS (irritable bowel syndrome)   . Breast cyst   . GERD (gastroesophageal reflux disease)   . Glaucoma   . Joint pain   . Cancer 2014    right breast cancer    Past Surgical History  Procedure Laterality Date  . Abdominal hysterectomy  1976  . Mm breast stereo bx*r*r/s Right 2014  . Breast surgery Right 07-02-2013    with sentinal biopsy  . Breast surgery Right 07-18-13  . Breast mammosite Right 08-01-13    Family History  Problem Relation Age of Onset  . Cancer Mother     age 76 breast  . Cancer Brother     prostate/lung/ liver /spleen    Social History History  Substance Use Topics  . Smoking status: Never Smoker   . Smokeless tobacco: Never Used  . Alcohol Use: No    No Known Allergies  Current Outpatient Prescriptions  Medication Sig Dispense Refill  . alendronate (FOSAMAX) 70 MG tablet Take 70 mg by mouth every 7 (seven) days.       Marland Kitchen amLODipine (NORVASC) 2.5 MG tablet Take 2.5 mg by mouth daily.      Marland Kitchen anastrozole (ARIMIDEX) 1 MG tablet Take 1 mg by mouth daily.       . dorzolamide-timolol (COSOPT) 22.3-6.8 MG/ML ophthalmic solution Place 1 drop into both eyes.      . fluconazole (DIFLUCAN) 150 MG tablet Take 1 tablet by mouth daily.      Marland Kitchen FLUoxetine (PROZAC) 20 MG capsule Take 40 mg by mouth daily.       . fluticasone (FLONASE) 50 MCG/ACT nasal spray       . fluticasone (VERAMYST) 27.5 MCG/SPRAY nasal spray Place 2 sprays into the nose daily.      . hydrochlorothiazide (MICROZIDE) 12.5 MG capsule Take 12.5 mg by  mouth daily.      Marland Kitchen LORazepam (ATIVAN) 0.5 MG tablet Take 0.5 mg by mouth every 8 (eight) hours.      Marland Kitchen losartan (COZAAR) 50 MG tablet Take 50 mg by mouth daily.      Marland Kitchen LUMIGAN 0.01 % SOLN Place 1 drop into both eyes.      . Multiple Vitamins-Minerals (BIOTECT PLUS PO) Take by mouth.      . RABEprazole (ACIPHEX) 20 MG tablet Take 20 mg by mouth daily.      . rosuvastatin (CRESTOR) 5 MG tablet Take 5 mg by mouth daily.      Marland Kitchen tetrahydrozoline 0.05 % ophthalmic solution Place 1 drop into both eyes.      Marland Kitchen triamcinolone cream (KENALOG) 0.1 % 1 application as needed.      . sulfamethoxazole-trimethoprim (BACTRIM DS) 800-160 MG per tablet Take 1 tablet by mouth 2 (two) times daily.  14 tablet  0   No current facility-administered medications for this visit.    Review of Systems Review of Systems  Constitutional: Negative.   Respiratory: Negative.   Cardiovascular: Negative.     Blood  pressure 126/70, pulse 74, resp. rate 14, height 5\' 3"  (1.6 m), weight 166 lb (75.297 kg).  Physical Exam Physical Exam  Constitutional: She is oriented to person, place, and time. She appears well-developed and well-nourished.  Pulmonary/Chest:  Right breast upper outer quadrant at lumpectomy site there is mild fullness and tenderness.Mild redness on the overlying skin.  Neurological: She is alert and oriented to person, place, and time.  Skin: Skin is warm and dry.    Data Reviewed Ultrasound showed a seroma cavity   Assessment    Seroma in lumpectomy cavity, possible infection     Plan   Performed right breast ultrasound. FNA 5ml of clear yellow fluid. Rx sent for bactrim BID. Patient to return in two weeks.         SANKAR,SEEPLAPUTHUR G 11/26/2013, 12:21 PM

## 2013-11-26 NOTE — Patient Instructions (Signed)
Patient to return in two weeks  

## 2013-11-30 ENCOUNTER — Observation Stay: Payer: Self-pay | Admitting: Specialist

## 2013-11-30 LAB — COMPREHENSIVE METABOLIC PANEL
Albumin: 3.6 g/dL (ref 3.4–5.0)
Anion Gap: 8 (ref 7–16)
Chloride: 95 mmol/L — ABNORMAL LOW (ref 98–107)
Creatinine: 0.9 mg/dL (ref 0.60–1.30)
EGFR (Non-African Amer.): >60
Glucose: 148 mg/dL — ABNORMAL HIGH (ref 65–99)
Osmolality: 254 (ref 275–301)
Potassium: 3.6 mmol/L (ref 3.5–5.1)
SGOT(AST): 33 U/L (ref 15–37)
Sodium: 125 mmol/L — ABNORMAL LOW (ref 136–145)
Total Protein: 7.6 g/dL (ref 6.4–8.2)

## 2013-11-30 LAB — URINALYSIS, COMPLETE
Glucose,UR: NEGATIVE mg/dL (ref 0–75)
Leukocyte Esterase: NEGATIVE
Nitrite: NEGATIVE
Protein: NEGATIVE
RBC,UR: 26 /HPF (ref 0–5)
Specific Gravity: 1.01 (ref 1.003–1.030)
Squamous Epithelial: <1
WBC UR: 5 /HPF (ref 0–5)

## 2013-11-30 LAB — CBC
HCT: 34.2 % — ABNORMAL LOW (ref 35.0–47.0)
HGB: 11.5 g/dL — ABNORMAL LOW (ref 12.0–16.0)
MCV: 79 fL — ABNORMAL LOW (ref 80–100)
Platelet: 279 10*3/uL (ref 150–440)
RBC: 4.31 10*6/uL (ref 3.80–5.20)
WBC: 4.9 10*3/uL (ref 3.6–11.0)

## 2013-11-30 LAB — TROPONIN I: Troponin-I: <0.02 ng/mL

## 2013-12-01 LAB — BASIC METABOLIC PANEL
Anion Gap: 9 (ref 7–16)
Co2: 22 mmol/L (ref 21–32)
Creatinine: 0.8 mg/dL (ref 0.60–1.30)
EGFR (African American): >60
EGFR (Non-African Amer.): >60
Glucose: 93 mg/dL (ref 65–99)
Potassium: 3.7 mmol/L (ref 3.5–5.1)
Sodium: 135 mmol/L — ABNORMAL LOW (ref 136–145)

## 2013-12-02 LAB — BASIC METABOLIC PANEL
BUN: 6 mg/dL — ABNORMAL LOW (ref 7–18)
Co2: 23 mmol/L (ref 21–32)
Creatinine: 0.72 mg/dL (ref 0.60–1.30)
EGFR (African American): >60
Glucose: 92 mg/dL (ref 65–99)
Potassium: 3.8 mmol/L (ref 3.5–5.1)
Sodium: 135 mmol/L — ABNORMAL LOW (ref 136–145)

## 2013-12-06 ENCOUNTER — Other Ambulatory Visit: Payer: Self-pay | Admitting: Gastroenterology

## 2013-12-09 ENCOUNTER — Ambulatory Visit: Payer: Self-pay | Admitting: Internal Medicine

## 2013-12-09 LAB — CBC CANCER CENTER
Basophil %: 0.8 %
Eosinophil #: 0.1 x10 3/mm (ref 0.0–0.7)
HGB: 11.9 g/dL — ABNORMAL LOW (ref 12.0–16.0)
Lymphocyte #: 1.4 x10 3/mm (ref 1.0–3.6)
MCH: 26.6 pg (ref 26.0–34.0)
MCHC: 32.9 g/dL (ref 32.0–36.0)
MCV: 81 fL (ref 80–100)
Monocyte #: 0.4 x10 3/mm (ref 0.2–0.9)
Neutrophil #: 3.1 x10 3/mm (ref 1.4–6.5)
Neutrophil %: 61.2 %
Platelet: 283 x10 3/mm (ref 150–440)
RBC: 4.46 10*6/uL (ref 3.80–5.20)
RDW: 15.7 % — ABNORMAL HIGH (ref 11.5–14.5)

## 2013-12-09 LAB — BASIC METABOLIC PANEL
BUN: 15 mg/dL (ref 7–18)
Chloride: 98 mmol/L (ref 98–107)
Co2: 26 mmol/L (ref 21–32)
EGFR (African American): >60
EGFR (Non-African Amer.): >60
Glucose: 113 mg/dL — ABNORMAL HIGH (ref 65–99)
Osmolality: 268 (ref 275–301)
Sodium: 133 mmol/L — ABNORMAL LOW (ref 136–145)

## 2013-12-09 LAB — HEPATIC FUNCTION PANEL A (ARMC)
Albumin: 3.6 g/dL (ref 3.4–5.0)
Bilirubin, Direct: 0.1 mg/dL (ref 0.00–0.20)
Bilirubin,Total: 0.2 mg/dL (ref 0.2–1.0)
SGPT (ALT): 42 U/L (ref 12–78)
Total Protein: 7.7 g/dL (ref 6.4–8.2)

## 2013-12-10 ENCOUNTER — Ambulatory Visit: Payer: Self-pay | Admitting: Gastroenterology

## 2013-12-10 ENCOUNTER — Ambulatory Visit: Payer: Medicare Other | Admitting: General Surgery

## 2013-12-10 LAB — CANCER ANTIGEN 27.29: CA 27.29: 10.5 U/mL (ref 0.0–38.6)

## 2013-12-24 ENCOUNTER — Encounter: Payer: Self-pay | Admitting: General Surgery

## 2013-12-24 ENCOUNTER — Ambulatory Visit (INDEPENDENT_AMBULATORY_CARE_PROVIDER_SITE_OTHER): Payer: Medicare Other | Admitting: General Surgery

## 2013-12-24 VITALS — BP 160/88 | HR 66 | Resp 12 | Ht 63.0 in | Wt 165.0 lb

## 2013-12-24 DIAGNOSIS — C50411 Malignant neoplasm of upper-outer quadrant of right female breast: Secondary | ICD-10-CM

## 2013-12-24 DIAGNOSIS — C50419 Malignant neoplasm of upper-outer quadrant of unspecified female breast: Secondary | ICD-10-CM

## 2013-12-24 NOTE — Progress Notes (Signed)
Here todfay for follow up seroma right breast. States she was having symptoms while on the Arimidex, complains of tired and nausea.  She did a trial without it for one week and she felt better. Mammogram for 01-01-14. No recurrence of seroma. No redness or induration of right breast noted. She is to return as scheduled after her right breast mammogram

## 2013-12-24 NOTE — Patient Instructions (Signed)
Mammogram as scheduled.

## 2013-12-26 ENCOUNTER — Ambulatory Visit: Payer: Self-pay | Admitting: Internal Medicine

## 2014-01-01 ENCOUNTER — Ambulatory Visit: Payer: Self-pay | Admitting: General Surgery

## 2014-01-02 ENCOUNTER — Encounter: Payer: Self-pay | Admitting: General Surgery

## 2014-01-08 ENCOUNTER — Ambulatory Visit (INDEPENDENT_AMBULATORY_CARE_PROVIDER_SITE_OTHER): Payer: Medicare Other | Admitting: General Surgery

## 2014-01-08 ENCOUNTER — Encounter: Payer: Self-pay | Admitting: General Surgery

## 2014-01-08 VITALS — BP 128/68 | HR 72 | Resp 12 | Ht 63.0 in | Wt 166.0 lb

## 2014-01-08 DIAGNOSIS — C50419 Malignant neoplasm of upper-outer quadrant of unspecified female breast: Secondary | ICD-10-CM

## 2014-01-08 NOTE — Progress Notes (Signed)
Patient ID: Abigail Shaw, female   DOB: 02-27-1935, 79 y.o.   MRN: [id number]  Chief Complaint  Patient presents with  . Follow-up    mammogram    HPI Abigail Shaw is a 79 y.o. female who presents for a breast evaluation. The most recent mammogram was done on 01/01/14 Cat 2. She reports she is still having some pain in the right breast that comes and goes. She is feeling very fatigued while taking the Armidex.      HPI  Past Medical History  Diagnosis Date  . Hypertension   . IBS (irritable bowel syndrome)   . Breast cyst   . GERD (gastroesophageal reflux disease)   . Glaucoma   . Joint pain   . Cancer 2014    right breast cancer    Past Surgical History  Procedure Laterality Date  . Abdominal hysterectomy  1976  . Mm breast stereo bx*r*r/s Right 2014  . Breast surgery Right 07-02-2013    with sentinal biopsy  . Breast surgery Right 07-18-13  . Breast mammosite Right 08-01-13    Family History  Problem Relation Age of Onset  . Cancer Mother     age 96 breast  . Cancer Brother     prostate/lung/ liver /spleen    Social History History  Substance Use Topics  . Smoking status: Never Smoker   . Smokeless tobacco: Never Used  . Alcohol Use: No    No Known Allergies  Current Outpatient Prescriptions  Medication Sig Dispense Refill  . alendronate (FOSAMAX) 70 MG tablet Take 70 mg by mouth every 7 (seven) days.       Marland Kitchen amLODipine (NORVASC) 2.5 MG tablet Take 2.5 mg by mouth daily.      Marland Kitchen anastrozole (ARIMIDEX) 1 MG tablet Take 1 mg by mouth daily.       . dorzolamide-timolol (COSOPT) 22.3-6.8 MG/ML ophthalmic solution Place 1 drop into both eyes.      Marland Kitchen FLUoxetine (PROZAC) 20 MG capsule Take 20 mg by mouth daily.       . fluticasone (FLONASE) 50 MCG/ACT nasal spray       . hydrochlorothiazide (MICROZIDE) 12.5 MG capsule Take 12.5 mg by mouth daily.      Marland Kitchen LORazepam (ATIVAN) 0.5 MG tablet Take 0.5 mg by mouth every 8 (eight) hours.      Marland Kitchen losartan (COZAAR) 50 MG  tablet Take 50 mg by mouth daily.      Marland Kitchen LUMIGAN 0.01 % SOLN Place 1 drop into both eyes.      . Multiple Vitamins-Minerals (BIOTECT PLUS PO) Take by mouth.      . RABEprazole (ACIPHEX) 20 MG tablet Take 20 mg by mouth daily.      . rosuvastatin (CRESTOR) 5 MG tablet Take 5 mg by mouth every other day.       . triamcinolone cream (KENALOG) 0.1 % 1 application as needed.      . ondansetron (ZOFRAN-ODT) 4 MG disintegrating tablet        No current facility-administered medications for this visit.    Review of Systems Review of Systems  Constitutional: Negative.   Respiratory: Negative.   Cardiovascular: Negative.     Blood pressure 128/68, pulse 72, resp. rate 12, height 5\' 3"  (1.6 m), weight 166 lb (75.297 kg).  Physical Exam Physical Exam  Constitutional: She is oriented to person, place, and time. She appears well-developed and well-nourished.  Neck: Neck supple.  Cardiovascular: Normal rate and regular rhythm.  Murmur heard.  Systolic (best heard at upper sternal area) murmur is present with a grade of 2/6  Pulmonary/Chest: Effort normal and breath sounds normal. Right breast exhibits tenderness (mild). Right breast exhibits no inverted nipple, no mass, no nipple discharge and no skin change. Left breast exhibits tenderness (mild). Left breast exhibits no inverted nipple, no mass, no nipple discharge and no skin change.  Right breast lumpectomy site is well healed, no sign of seroma or infection  Lymphadenopathy:    She has no cervical adenopathy.    She has no axillary adenopathy.  Neurological: She is alert and oriented to person, place, and time.    Data Reviewed Right mammogram- post surgical changes  Assessment    CA right breast, T1,N0,M0. Er/PR pos, Her 2 neg. Now on Arimidex.     Plan    6 mo f/u with bilateral diagnostic mammogram.        Abigail Shaw 01/08/2014, 3:17 PM

## 2014-01-08 NOTE — Patient Instructions (Addendum)
Consult with Dr Verneda Skill office regarding your heart murmur.

## 2014-01-26 ENCOUNTER — Ambulatory Visit: Payer: Self-pay | Admitting: Internal Medicine

## 2014-01-31 ENCOUNTER — Encounter: Payer: Self-pay | Admitting: Podiatry

## 2014-02-23 ENCOUNTER — Encounter: Payer: Self-pay | Admitting: Podiatry

## 2014-02-23 ENCOUNTER — Ambulatory Visit: Payer: Self-pay | Admitting: Internal Medicine

## 2014-03-26 ENCOUNTER — Encounter: Payer: Self-pay | Admitting: Podiatry

## 2014-04-25 ENCOUNTER — Encounter: Payer: Self-pay | Admitting: Podiatry

## 2014-06-16 ENCOUNTER — Ambulatory Visit (INDEPENDENT_AMBULATORY_CARE_PROVIDER_SITE_OTHER): Payer: Medicare Other | Admitting: Podiatry

## 2014-06-16 ENCOUNTER — Encounter: Payer: Self-pay | Admitting: Podiatry

## 2014-06-16 VITALS — BP 138/71 | HR 66 | Resp 16 | Ht 62.0 in | Wt 167.0 lb

## 2014-06-16 DIAGNOSIS — M7660 Achilles tendinitis, unspecified leg: Secondary | ICD-10-CM

## 2014-06-16 NOTE — Progress Notes (Signed)
Abigail Shaw presents today chief complaint of painful Achilles tendinitis insertional in nature left heel. She states that is the same thing that she's had previously. However she has stopped treatment for the Achilles and physical therapy for the Achilles secondary to breast cancer. She states that she did start physical therapy from the [facility] that only helped while she was receiving the therapy.  Objective: Vital signs are stable she is alert and oriented x3. Pulses are palpable left lower extremity. No calf pain. She has pain on direct palpation posterior aspect of the calcaneus at the calcaneal tendo Achilles insertion site. Is exquisitely painful on palpation.  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  Plan: Injected her bursitis Achilles tendinitis of the left heel again today with 2 mg of dexamethasone to the point of maximal tenderness. Encouraged her to continue the use of the night splint and the Cam Walker. She will continue the use of the navicular regular basis and I will followup with her at her request for surgical intervention. She states that she does not want to consider any out-of-pocket expense is short of surgery.

## 2014-07-02 DIAGNOSIS — H401490 Capsular glaucoma with pseudoexfoliation of lens, unspecified eye, stage unspecified: Secondary | ICD-10-CM | POA: Insufficient documentation

## 2014-07-08 ENCOUNTER — Encounter: Payer: Self-pay | Admitting: General Surgery

## 2014-07-21 ENCOUNTER — Ambulatory Visit (INDEPENDENT_AMBULATORY_CARE_PROVIDER_SITE_OTHER): Payer: Medicare Other | Admitting: General Surgery

## 2014-07-21 ENCOUNTER — Encounter: Payer: Self-pay | Admitting: General Surgery

## 2014-07-21 VITALS — BP 118/68 | HR 70 | Resp 14 | Ht 63.0 in | Wt 168.0 lb

## 2014-07-21 DIAGNOSIS — C50419 Malignant neoplasm of upper-outer quadrant of unspecified female breast: Secondary | ICD-10-CM

## 2014-07-21 DIAGNOSIS — C50411 Malignant neoplasm of upper-outer quadrant of right female breast: Secondary | ICD-10-CM

## 2014-07-21 NOTE — Patient Instructions (Signed)
Patient to return in 6 months with right breast diagnostic mammogram. Continue self breast exams. Call office for any new breast issues or concerns.

## 2014-07-21 NOTE — Progress Notes (Signed)
Patient ID: Abigail Shaw, female   DOB: 11/24/1935, 79 y.o.   MRN: [id number]  Chief Complaint  Patient presents with  . Follow-up    6 month follow up bilateral diagnostic mammogram    HPI Abigail Shaw is a 79 y.o. female who presents for a breast evaluation. The most recent mammogram was done on 07/08/14. Patient does perform regular self breast checks and gets regular mammograms done. The patient denies any problems with the breast at this time.    HPI  Past Medical History  Diagnosis Date  . Hypertension   . IBS (irritable bowel syndrome)   . Breast cyst   . GERD (gastroesophageal reflux disease)   . Glaucoma   . Joint pain   . Cancer 2014    right breast cancer    Past Surgical History  Procedure Laterality Date  . Abdominal hysterectomy  1976  . Mm breast stereo bx*r*r/s Right 2014  . Breast surgery Right 07-02-2013    with sentinal biopsy  . Breast surgery Right 07-18-13  . Breast mammosite Right 08-01-13    Family History  Problem Relation Age of Onset  . Cancer Mother     age 56 breast  . Cancer Brother     prostate/lung/ liver /spleen    Social History History  Substance Use Topics  . Smoking status: Never Smoker   . Smokeless tobacco: Never Used  . Alcohol Use: No    No Known Allergies  Current Outpatient Prescriptions  Medication Sig Dispense Refill  . alendronate (FOSAMAX) 70 MG tablet Take 70 mg by mouth every 7 (seven) days.       Marland Kitchen amLODipine (NORVASC) 2.5 MG tablet Take 2.5 mg by mouth daily.      . dorzolamide-timolol (COSOPT) 22.3-6.8 MG/ML ophthalmic solution Place 1 drop into both eyes.      Marland Kitchen exemestane (AROMASIN) 25 MG tablet Take 1 tablet by mouth daily.      Marland Kitchen FLUoxetine (PROZAC) 20 MG capsule Take 20 mg by mouth daily.       . fluticasone (FLONASE) 50 MCG/ACT nasal spray       . hydrochlorothiazide (MICROZIDE) 12.5 MG capsule Take 12.5 mg by mouth daily.      Marland Kitchen LORazepam (ATIVAN) 0.5 MG tablet Take 0.5 mg by mouth every 8  (eight) hours.      Marland Kitchen losartan (COZAAR) 50 MG tablet Take 50 mg by mouth daily.      Marland Kitchen LUMIGAN 0.01 % SOLN Place 1 drop into both eyes.      . meloxicam (MOBIC) 15 MG tablet       . Olopatadine HCl (PATADAY) 0.2 % SOLN as needed.      . RABEprazole (ACIPHEX) 20 MG tablet Take 20 mg by mouth daily.      . rizatriptan (MAXALT) 10 MG tablet Take 10 mg by mouth as needed for migraine. May repeat in 2 hours if needed      . rosuvastatin (CRESTOR) 5 MG tablet Take 5 mg by mouth every other day.       . triamcinolone cream (KENALOG) 0.1 % 1 application as needed.      . Vitamin D, Ergocalciferol, (DRISDOL) 50000 UNITS CAPS capsule Take 1 capsule by mouth once a week.       No current facility-administered medications for this visit.    Review of Systems Review of Systems  Constitutional: Negative.   Respiratory: Negative.   Cardiovascular: Negative.     Blood pressure 118/68,  pulse 70, resp. rate 14, height 5\' 3"  (1.6 m), weight 168 lb (76.204 kg).  Physical Exam Physical Exam  Constitutional: She is oriented to person, place, and time. She appears well-developed and well-nourished.  Eyes: Conjunctivae are normal. No scleral icterus.  Neck: Neck supple. No thyromegaly present.  Cardiovascular: Normal rate and regular rhythm.   Murmur heard.  Systolic murmur is present with a grade of 2/6  Unchanged murmur from before  Pulmonary/Chest: Effort normal and breath sounds normal. Right breast exhibits tenderness. Right breast exhibits no inverted nipple, no mass, no nipple discharge and no skin change. Left breast exhibits tenderness. Left breast exhibits no inverted nipple, no mass, no nipple discharge and no skin change.  Non focal breast (mild) tenderness bilaterally.   Lymphadenopathy:    She has no cervical adenopathy.    She has no axillary adenopathy.  Neurological: She is alert and oriented to person, place, and time.  Skin: Skin is warm and dry.    Data Reviewed  Mammogram  reviewed and stable.  Assessment    Stable exam. 50yr post right breast lumpectomy, sentnel node biopsy, radiation for a T1,N0 cancer. ER/PR pos, Her 2 neg. Pt is on Arimedex .Marland Kitchen     Plan    Patient to return in 6 months with right breast diagnostic mammogram.        Ebonie Westerlund G 07/23/2014, 6:23 AM

## 2014-07-23 ENCOUNTER — Encounter: Payer: Self-pay | Admitting: General Surgery

## 2014-08-04 ENCOUNTER — Telehealth: Payer: Self-pay | Admitting: *Deleted

## 2014-08-04 NOTE — Telephone Encounter (Signed)
Call  

## 2014-08-04 NOTE — Telephone Encounter (Signed)
Spoke with pt letting her know shelly would call her back to schedule her an appt to see dr Milinda Pointer

## 2014-08-07 ENCOUNTER — Telehealth: Payer: Self-pay

## 2014-08-07 ENCOUNTER — Ambulatory Visit (INDEPENDENT_AMBULATORY_CARE_PROVIDER_SITE_OTHER): Payer: Medicare Other | Admitting: Podiatry

## 2014-08-07 VITALS — BP 146/116 | HR 57 | Resp 16

## 2014-08-07 DIAGNOSIS — M7660 Achilles tendinitis, unspecified leg: Secondary | ICD-10-CM

## 2014-08-07 NOTE — Telephone Encounter (Signed)
She was not home but I was able to talk with the husband and he will let her know the details. The patient is to get more family history data and I will call her back next week.

## 2014-08-07 NOTE — Telephone Encounter (Signed)
Patient called asking about having genetic testing done and had some questions about this as well as some concerns about insurance. She would like a call back about this.

## 2014-08-08 NOTE — Progress Notes (Signed)
She presents today for followup of her tendo Achilles tendinitis to her left heel.  Objective: She has pain on palpation of the posterior aspect of the left heel at the tendo Achilles insertion. There is mild edema no erythema cellulitis drainage or odor and no warmth. DeGraff assessment: Insertional tendinitis left posterior heel.  Plan: Injected 2 mg of dexamethasone. She will continue all conservative therapies I will followup with her for surgical intervention after her daughter's wedding.

## 2014-08-15 ENCOUNTER — Ambulatory Visit: Payer: Self-pay | Admitting: Internal Medicine

## 2014-09-29 ENCOUNTER — Ambulatory Visit (INDEPENDENT_AMBULATORY_CARE_PROVIDER_SITE_OTHER): Payer: Medicare Other | Admitting: Podiatry

## 2014-09-29 VITALS — BP 150/77 | HR 58 | Resp 16

## 2014-09-29 DIAGNOSIS — M7662 Achilles tendinitis, left leg: Secondary | ICD-10-CM

## 2014-09-29 DIAGNOSIS — S86012D Strain of left Achilles tendon, subsequent encounter: Secondary | ICD-10-CM

## 2014-09-29 NOTE — Progress Notes (Signed)
She presents today for followup of her tendo Achilles tendinitis of her left heel. She states that I simply cannot bend to pain he longer. She continues to wear her Cam Walker in her night splint on a regular basis but this is affecting her ability to perform her daily activities.  Objective: Vital signs are stable she is alert and oriented x3. Pulses are palpable left foot. She has pain on palpation posterior aspect of the tendo Achilles at its insertion site. Radiographic review does demonstrate a retrocalcaneal heel spur with tendo Achilles more than likely she has a split tear in the area because of failure to heal particularly with bracing and physical therapy x2.  Assessment: Chronic Achilles tendinitis greater than a year and a half left. Probable tendo Achilles tear left.  Plan: Request an MRI of her left Achilles for surgical repair.

## 2014-10-07 ENCOUNTER — Telehealth: Payer: Self-pay | Admitting: *Deleted

## 2014-10-07 NOTE — Telephone Encounter (Signed)
Pt called and left message letting us know she r/s her MRI from 10.14.15 to 10.19.15. Stated she had an emergency to come up and it had to be r/s.

## 2014-10-13 ENCOUNTER — Ambulatory Visit: Payer: Self-pay | Admitting: Podiatry

## 2014-10-15 ENCOUNTER — Telehealth: Payer: Self-pay | Admitting: *Deleted

## 2014-10-15 NOTE — Telephone Encounter (Signed)
Spoke to patient regarding mri reread service, we will call her as soon as results come back .

## 2014-10-22 ENCOUNTER — Encounter: Payer: Self-pay | Admitting: Podiatry

## 2014-10-27 ENCOUNTER — Encounter: Payer: Self-pay | Admitting: General Surgery

## 2014-11-05 ENCOUNTER — Encounter: Payer: Self-pay | Admitting: *Deleted

## 2014-11-05 ENCOUNTER — Ambulatory Visit (INDEPENDENT_AMBULATORY_CARE_PROVIDER_SITE_OTHER): Payer: Medicare Other | Admitting: Podiatry

## 2014-11-05 VITALS — BP 154/87 | HR 63 | Resp 16

## 2014-11-05 DIAGNOSIS — M7662 Achilles tendinitis, left leg: Secondary | ICD-10-CM

## 2014-11-05 DIAGNOSIS — M7732 Calcaneal spur, left foot: Secondary | ICD-10-CM

## 2014-11-05 NOTE — Progress Notes (Signed)
Abigail Shaw presents today with her husband for her MRI results regarding her left anterior heel. She denies any changes in her past medical history medications allergies surgeries and social history. Denies any cardiac events and pulmonary issues.  Objective: Vital signs are stable she is alert and oriented 3. Mild hypertension systolic today. MRI results demonstrate longitudinal fraying of the Achilles tendon micro-intratendinous tears with chronic tendinosis. Retrocalcaneal heel spurs also noted.  Assessment: Chronic tendinosis of the Achilles left heel with retrocalcaneal heel spur.  Plan: We discussed the etiology pathology conservative versus surgical therapies. At this point this is a lifestyle limiting disability which require surgical intervention. Surgery will consist of an Achilles tendon tenolysis and retrocalcaneal heel spur excision with cast application. We discussed this in great detail today and will schedule her for surgical intervention in the near future. We went over the consent form line by line number by number giving her apple time to ask questions she saw fit regarding these procedures I answered them to the best of my ability in layman's terms. We did discuss the possible postop complications which may include but are not limited to postop pain bleeding swelling infection need for further surgery also digit loss of limb loss of life. She understands this is amenable to it and will follow up with me after she is cleared by her medical doctor.

## 2014-11-10 ENCOUNTER — Telehealth: Payer: Self-pay | Admitting: *Deleted

## 2014-11-10 NOTE — Telephone Encounter (Signed)
Called and left message letting her know dr. Barbarann Ehlers office has not yet approved her surgery.

## 2014-11-10 NOTE — Telephone Encounter (Signed)
"  I'm calling to see if you received clearance for my surgery yet?  If not I can call them and see if I can put a fire under them."  I told her I would get the [place] assistants to call her.

## 2014-12-02 ENCOUNTER — Telehealth: Payer: Self-pay | Admitting: *Deleted

## 2014-12-02 DIAGNOSIS — M25519 Pain in unspecified shoulder: Secondary | ICD-10-CM | POA: Insufficient documentation

## 2014-12-02 NOTE — Telephone Encounter (Signed)
Pt called and stated she has a pulled muscle in her shoulder and she has been taking baclofen and vicodin this week. Pt wanted to know if this will interfere with having her surgery on fri 12.11.15. Pt stated she will not be taking any of the medications the morning of surgery.

## 2014-12-02 NOTE — Telephone Encounter (Signed)
No she should be fine.

## 2014-12-03 ENCOUNTER — Other Ambulatory Visit: Payer: Self-pay | Admitting: Podiatry

## 2014-12-03 MED ORDER — CEPHALEXIN 500 MG PO CAPS: 500.0000 mg | ORAL_CAPSULE | Freq: Three times a day (TID) | ORAL | Status: DC

## 2014-12-03 MED ORDER — PROMETHAZINE HCL 25 MG PO TABS: 25.0000 mg | ORAL_TABLET | Freq: Three times a day (TID) | ORAL | Status: DC | PRN

## 2014-12-03 MED ORDER — OXYCODONE-ACETAMINOPHEN 10-325 MG PO TABS
ORAL_TABLET | ORAL | Status: DC
Start: 2014-12-03 — End: 2015-03-16

## 2014-12-03 NOTE — Telephone Encounter (Signed)
Called spoke with pt letting her know per dr Milinda Pointer she should be fine for surgery on 12.11.15. Pt understood.

## 2014-12-05 ENCOUNTER — Telehealth: Payer: Self-pay | Admitting: *Deleted

## 2014-12-05 DIAGNOSIS — M65879 Other synovitis and tenosynovitis, unspecified ankle and foot: Secondary | ICD-10-CM

## 2014-12-05 DIAGNOSIS — M7732 Calcaneal spur, left foot: Secondary | ICD-10-CM

## 2014-12-05 HISTORY — PX: FOOT SURGERY: SHX648

## 2014-12-05 MED ORDER — FLUCONAZOLE 150 MG PO TABS: 150.0000 mg | ORAL_TABLET | Freq: Once | ORAL | Status: DC

## 2014-12-05 NOTE — Telephone Encounter (Signed)
Patient called stating that she will need a refill on the diflucan he wrote for her because one pill never works, always 2.

## 2014-12-05 NOTE — Telephone Encounter (Signed)
Returned call-Sent in refill for meds to pharmacy.

## 2014-12-08 ENCOUNTER — Ambulatory Visit (INDEPENDENT_AMBULATORY_CARE_PROVIDER_SITE_OTHER): Payer: Medicare Other

## 2014-12-08 ENCOUNTER — Encounter: Payer: Self-pay | Admitting: Podiatry

## 2014-12-08 ENCOUNTER — Ambulatory Visit (INDEPENDENT_AMBULATORY_CARE_PROVIDER_SITE_OTHER): Payer: Medicare Other | Admitting: Podiatry

## 2014-12-08 VITALS — BP 135/66 | HR 62 | Temp 98.1°F | Resp 16

## 2014-12-08 DIAGNOSIS — M7732 Calcaneal spur, left foot: Secondary | ICD-10-CM

## 2014-12-08 DIAGNOSIS — Z9889 Other specified postprocedural states: Secondary | ICD-10-CM

## 2014-12-08 NOTE — Progress Notes (Signed)
She presents today 3 days status post retrocalcaneal heel spur resection and Achilles tendon lysis. She states that is still tender.  Objective: Also stable she is alert and oriented 3 cast is intact. She has tenderness on palpation of the anterior tibia. Radiographic evaluation shows well-healing surgical foot.  Assessment: 1 surgical foot.  Plan: Continue use of this cast for the next week or so and we'll remove it and remove as many staples as possible at that time before recasting her.

## 2014-12-10 ENCOUNTER — Encounter: Payer: Self-pay | Admitting: Podiatry

## 2014-12-15 ENCOUNTER — Telehealth: Payer: Self-pay | Admitting: *Deleted

## 2014-12-15 NOTE — Telephone Encounter (Signed)
Pt called and stated she was wanting a rx for baclofen a muscle relaxer. States she received the medication at the [facility] walk in clinic and was seen by dr Deloria Lair. States it would be for her whole body but specified her shoulders and knees really bother her. i told pt she would need to speak with her primary care doctor about this. Pt understood.

## 2014-12-15 NOTE — Telephone Encounter (Signed)
Pt left her name and birthdate, but the message was cut off.

## 2014-12-24 ENCOUNTER — Encounter: Payer: Self-pay | Admitting: *Deleted

## 2014-12-24 ENCOUNTER — Encounter: Payer: Self-pay | Admitting: Podiatry

## 2014-12-24 ENCOUNTER — Ambulatory Visit (INDEPENDENT_AMBULATORY_CARE_PROVIDER_SITE_OTHER): Payer: Medicare Other | Admitting: Podiatry

## 2014-12-24 VITALS — BP 138/70 | HR 68 | Temp 98.6°F | Resp 16

## 2014-12-24 DIAGNOSIS — Z9889 Other specified postprocedural states: Secondary | ICD-10-CM

## 2014-12-24 NOTE — Progress Notes (Signed)
She presents today for cast removal status post retrocalcaneal heel spur resection and Achilles tendon lysis left. She denies fever chills nausea vomiting muscle aches and pains. Continues to be nonweightbearing with her knee scooter.  Objective: Vital signs are stable she is alert and oriented 3 dry sterile dressing was intact with cast intact and does not appear to have been walked on. Cast was removed today and the dressings were dry and clean. Once removed margins appear to be well coapted the posterior aspect of the foot and ankle with staples intact area a portion of the staples were removed and the margins remain well coapted. She has good range of motion of the foot and appears to be pain-free.  Assessment: Well-healing surgical foot status post 1 week retrocalcaneal heel spur resection.  Plan: Removal of cast today and reapplication of a dry sterile compressive dressing and a new cast which we will remove in 2 weeks and we will also remove the remaining staples.

## 2015-01-07 ENCOUNTER — Ambulatory Visit (INDEPENDENT_AMBULATORY_CARE_PROVIDER_SITE_OTHER): Payer: Medicare Other | Admitting: Podiatry

## 2015-01-07 VITALS — BP 151/75 | HR 57 | Temp 97.6°F | Resp 16

## 2015-01-07 DIAGNOSIS — Z9889 Other specified postprocedural states: Secondary | ICD-10-CM

## 2015-01-07 NOTE — Progress Notes (Signed)
She presents today 4 weeks status post retrocalcaneal exostectomy with tenolysis of the Achilles left. She presents today nonweightbearing utilizing her scooter and with her husband. Plantar aspect of the cast appears to be well kept and clean. She states that the leg is mildly tender but is in no pain like it was prior to surgery.  Objective: Vital signs are stable she is alert and oriented 3 dry sterile dressing and cast were intact once removed demonstrates sutures are intact which were removed today. Margins are well coapted. She has good strength on plantarflexion and dorsiflexion.  Assessment posterior retrocalcaneal heel spur resection with Achilles tenolysis.  Plan: Cast was removed today and we placed her in a compression anklet and in her short cam walker. I will follow-up with her in 2-4 weeks. I will allow her to start washing this foot and getting it wet.

## 2015-01-12 ENCOUNTER — Encounter: Payer: Self-pay | Admitting: General Surgery

## 2015-01-19 ENCOUNTER — Ambulatory Visit (INDEPENDENT_AMBULATORY_CARE_PROVIDER_SITE_OTHER): Payer: Medicare Other | Admitting: General Surgery

## 2015-01-19 ENCOUNTER — Encounter: Payer: Self-pay | Admitting: General Surgery

## 2015-01-19 VITALS — BP 122/66 | HR 68 | Resp 12 | Ht 63.0 in | Wt 179.0 lb

## 2015-01-19 DIAGNOSIS — C50411 Malignant neoplasm of upper-outer quadrant of right female breast: Secondary | ICD-10-CM | POA: Diagnosis not present

## 2015-01-19 NOTE — Patient Instructions (Addendum)
The patient has been asked to return to the office in six months with a bilateral diagnostic mammogram.   Continue self breast exams. Call office for any new breast issues or concerns.

## 2015-01-19 NOTE — Progress Notes (Signed)
Patient ID: Abigail Shaw, female   DOB: 27-May-1935, 80 y.o.   MRN: [id number]  Chief Complaint  Patient presents with  . Follow-up    mammogram    HPI Abigail Shaw is a 80 y.o. female who presents for a breast cancer follow up. The most recent right breast  mammogram was done on 01/09/15. Patient does perform regular self breast checks and gets regular mammograms done.  Patient states she had left heel surgery on 12/05/14.   HPI  Past Medical History  Diagnosis Date  . Hypertension   . IBS (irritable bowel syndrome)   . Breast cyst   . GERD (gastroesophageal reflux disease)   . Glaucoma   . Joint pain   . Cancer 2014    right breast cancer    Past Surgical History  Procedure Laterality Date  . Abdominal hysterectomy  1976  . Mm breast stereo bx*r*r/s Right 2014  . Breast surgery Right 07-02-2013    with sentinal biopsy  . Breast surgery Right 07-18-13  . Breast mammosite Right 08-01-13  . Foot surgery Left 12/05/14    Family History  Problem Relation Age of Onset  . Cancer Mother     age 34 breast  . Cancer Brother     prostate/lung/ liver /spleen  . Breast cancer Maternal Aunt     age 14  . Colon cancer Paternal Aunt     Social History History  Substance Use Topics  . Smoking status: Never Smoker   . Smokeless tobacco: Never Used  . Alcohol Use: No    Allergies  Allergen Reactions  . Lisinopril     Other reaction(s): Cough  . Sulfamethoxazole-Trimethoprim     Other reaction(s): Other (See Comments) HYPONATREMIA    Current Outpatient Prescriptions  Medication Sig Dispense Refill  . alendronate (FOSAMAX) 70 MG tablet Take 70 mg by mouth every 7 (seven) days.     Marland Kitchen amLODipine (NORVASC) 2.5 MG tablet Take 2.5 mg by mouth daily.    . cephALEXin (KEFLEX) 500 MG capsule Take 1 capsule (500 mg total) by mouth 3 (three) times daily. 30 capsule 0  . dorzolamide-timolol (COSOPT) 22.3-6.8 MG/ML ophthalmic solution Place 1 drop into both eyes.    Marland Kitchen exemestane  (AROMASIN) 25 MG tablet Take 1 tablet by mouth daily.    . fluconazole (DIFLUCAN) 150 MG tablet Take 1 tablet (150 mg total) by mouth once. 1 tablet 1  . FLUoxetine (PROZAC) 20 MG capsule Take 20 mg by mouth daily.     . fluticasone (FLONASE) 50 MCG/ACT nasal spray     . hydrochlorothiazide (MICROZIDE) 12.5 MG capsule Take 12.5 mg by mouth daily.    Marland Kitchen LORazepam (ATIVAN) 0.5 MG tablet Take 0.5 mg by mouth every 8 (eight) hours.    Marland Kitchen losartan (COZAAR) 50 MG tablet Take 50 mg by mouth daily.    Marland Kitchen LUMIGAN 0.01 % SOLN Place 1 drop into both eyes.    . Olopatadine HCl (PATADAY) 0.2 % SOLN as needed.    Marland Kitchen oxyCODONE-acetaminophen (PERCOCET) 10-325 MG per tablet Take one to two tablets by mouth every six to eight hours as needed for pain. 60 tablet 0  . promethazine (PHENERGAN) 25 MG tablet Take 1 tablet (25 mg total) by mouth every 8 (eight) hours as needed for nausea or vomiting. 20 tablet 0  . RABEprazole (ACIPHEX) 20 MG tablet Take 20 mg by mouth daily.    . rizatriptan (MAXALT) 10 MG tablet Take 10 mg by mouth  as needed for migraine. May repeat in 2 hours if needed    . rosuvastatin (CRESTOR) 5 MG tablet Take 5 mg by mouth every other day.     . triamcinolone cream (KENALOG) 0.1 % 1 application as needed.    . Turmeric 450 MG CAPS Take by mouth 2 (two) times daily.    . Vitamin D, Ergocalciferol, (DRISDOL) 50000 UNITS CAPS capsule Take 1 capsule by mouth once a week.     No current facility-administered medications for this visit.    Review of Systems Review of Systems  Constitutional: Negative.   Respiratory: Negative.   Cardiovascular: Negative.     Blood pressure 122/66, pulse 68, resp. rate 12, height 5\' 3"  (1.6 m), weight 179 lb (81.194 kg).  Physical Exam Physical Exam  Constitutional: She is oriented to person, place, and time. She appears well-developed and well-nourished.  Eyes: Conjunctivae are normal. No scleral icterus.  Neck: Neck supple.  Cardiovascular: Normal rate,  regular rhythm and normal heart sounds.   Pulmonary/Chest: Effort normal and breath sounds normal. Right breast exhibits no inverted nipple, no mass, no nipple discharge, no skin change and no tenderness. Left breast exhibits tenderness ( ). Left breast exhibits no inverted nipple, no mass, no nipple discharge and no skin change.  Abdominal: Soft. Normal appearance and bowel sounds are normal. There is no hepatomegaly. There is no tenderness. No hernia.  Lymphadenopathy:    She has no cervical adenopathy.    She has no axillary adenopathy.  Neurological: She is alert and oriented to person, place, and time.  Skin: Skin is warm and dry.    Data Reviewed Mammogram reviewed  Assessment    Stable exam,CA right breast, T1,N0,M0. Er/PR pos, Her 2 neg. 50mos post lumpectomy, sn biopsy and radiation. Currently on antihormonal therapy    Plan   The patient has been asked to return to the office in six months with a bilateral diagnostic mammogram.       SANKAR,SEEPLAPUTHUR G 01/20/2015, 6:01 PM

## 2015-01-20 ENCOUNTER — Encounter: Payer: Self-pay | Admitting: General Surgery

## 2015-01-26 ENCOUNTER — Ambulatory Visit (INDEPENDENT_AMBULATORY_CARE_PROVIDER_SITE_OTHER): Payer: Medicare Other | Admitting: Podiatry

## 2015-01-26 ENCOUNTER — Encounter: Payer: Self-pay | Admitting: Podiatry

## 2015-01-26 ENCOUNTER — Ambulatory Visit (INDEPENDENT_AMBULATORY_CARE_PROVIDER_SITE_OTHER): Payer: Medicare Other

## 2015-01-26 VITALS — BP 126/68 | HR 62 | Resp 12

## 2015-01-26 DIAGNOSIS — Z9889 Other specified postprocedural states: Secondary | ICD-10-CM

## 2015-01-26 DIAGNOSIS — M7662 Achilles tendinitis, left leg: Secondary | ICD-10-CM | POA: Diagnosis not present

## 2015-01-26 DIAGNOSIS — M7732 Calcaneal spur, left foot: Secondary | ICD-10-CM | POA: Diagnosis not present

## 2015-01-26 DIAGNOSIS — M65879 Other synovitis and tenosynovitis, unspecified ankle and foot: Secondary | ICD-10-CM

## 2015-01-26 NOTE — Progress Notes (Signed)
She presents today for follow-up of her retrocalcaneal heel spur resection and Achilles tendon lysis. Date of surgery was 12/05/2014. She states it seems to be doing much better and feels better than it did prior to surgery. She's been ambulating with and without the Cam Walker. She denies fever chills nausea vomiting muscle aches pain.. She denies any changes in her past medical history medications allergy surgeries and social history.  Objective: Vital signs are stable alert and oriented 3. Pulses are strongly palpable bilateral. Left heel demonstrates minimal erythema and no edema cellulitis drainage or odor. She has full dorsiflexion and plantar flexion with 5 over 5 muscle strength. Radiographic evaluation demonstrates well-healing surgical foot. Minimal edema in the soft tissue spaces.  Assessment: Well-healing surgical foot left.  Plan: I placed her in a Tri-Lock brace today and she will continue to utilize her compression anklet. She will now utilize a tennis shoe for her left foot and continued I's as needed. I will follow-up with her in 1 month.

## 2015-02-02 ENCOUNTER — Telehealth: Payer: Self-pay | Admitting: *Deleted

## 2015-02-02 NOTE — Telephone Encounter (Signed)
Patient has questions - please call.

## 2015-02-16 ENCOUNTER — Ambulatory Visit (INDEPENDENT_AMBULATORY_CARE_PROVIDER_SITE_OTHER): Payer: Medicare Other

## 2015-02-16 ENCOUNTER — Ambulatory Visit (INDEPENDENT_AMBULATORY_CARE_PROVIDER_SITE_OTHER): Payer: Medicare Other | Admitting: Podiatry

## 2015-02-16 ENCOUNTER — Encounter: Payer: Self-pay | Admitting: Podiatry

## 2015-02-16 VITALS — BP 127/62 | HR 64 | Resp 12

## 2015-02-16 DIAGNOSIS — M7662 Achilles tendinitis, left leg: Secondary | ICD-10-CM | POA: Diagnosis not present

## 2015-02-16 DIAGNOSIS — Z9889 Other specified postprocedural states: Secondary | ICD-10-CM

## 2015-02-16 NOTE — Progress Notes (Signed)
She presents today for a follow-up of her retrocalcaneal heel spur resection and Achilles tendon lysis. Date of surgery was 12/05/2014. She states that she's feeling better every day but she cannot wear the brace.  Objective: Vital signs are stable she is alert and oriented 3. Pulses are palpable left foot. Mild edema about the retrocalcaneal area and of the ankle. Radiograph confirms well-healing surgical foot left. No tenderness on palpation of the retrocalcaneal site +5 over 5 plantar flexion against resistance.  Assessment: Well-healing surgical foot left.  Plan: I encouraged her to wear her brace for the next month. I will follow-up with her in 1 month for her final review. She is to notify me with any concerns.

## 2015-03-16 ENCOUNTER — Encounter: Payer: Self-pay | Admitting: Podiatry

## 2015-03-16 ENCOUNTER — Ambulatory Visit (INDEPENDENT_AMBULATORY_CARE_PROVIDER_SITE_OTHER): Payer: Medicare Other | Admitting: Podiatry

## 2015-03-16 VITALS — BP 138/68 | HR 61 | Resp 16

## 2015-03-16 DIAGNOSIS — M7662 Achilles tendinitis, left leg: Secondary | ICD-10-CM

## 2015-03-16 DIAGNOSIS — Z9889 Other specified postprocedural states: Secondary | ICD-10-CM

## 2015-03-16 DIAGNOSIS — M7732 Calcaneal spur, left foot: Secondary | ICD-10-CM

## 2015-03-16 NOTE — Progress Notes (Signed)
She presents today for her final postop visit regarding her retrocalcaneal heel spur resection and Achilles repair. She states that she has no pain whatsoever and fills 100% better.  Objective: Vital signs are stable she is alert and oriented 3 she has no pain on palpation no swelling no erythema cellulitis drainage or odor to the posterior aspect of her left heel. He has full muscle strength in making go up on her toes.  Assessment: Well-healing surgical Achilles tendon.  Plan: Discussed etiology pathology conservative versus surgical therapies she will follow up with [place] as needed.

## 2015-04-17 NOTE — Op Note (Signed)
PATIENT NAME:  Abigail Shaw, Abigail Shaw MR#:  [id number] DATE OF BIRTH:  03-26-1935  DATE OF PROCEDURE:  07/02/2013  PREOPERATIVE DIAGNOSIS: Carcinoma, right breast.   POSTOPERATIVE DIAGNOSIS: Carcinoma, right breast.   PROCEDURES PERFORMED:  1.  Ultrasound-guided wire localization of right breast mass.  2.  Right breast lumpectomy.  3.  Sentinel node biopsy, right.   SURGEON: Mckinley Jewel, M.D.   ANESTHESIA: General.   COMPLICATIONS: None.   ESTIMATED BLOOD LOSS: Minimal.   DRAINS: None.   DESCRIPTION OF PROCEDURE: The patient was brought to the operating room after she underwent nuclear contrast injection in the right breast. She was placed in the supine position on the operating table and put to sleep with a LMA. The gamma finder showed some very minimal activity in the lower portion of the medial axilla just outside the axillary tail of the breast. Given the low signal activity, 5 mL of 50% diluted methylene blue was injected in the subareolar tissue. The right breast was then prepped and draped out as a sterile field. A timeout procedure was performed. An incision was made overlying the inferior aspect of the axilla medially in a transverse orientation and dissected down to find 2 nodes adjoining each other both with the blue dye and showing some minimal activity on the gamma finder. These were removed and sent for frozen section, which subsequently showed no evidence of gross macrometastases. The surrounding axillary tissue showed no palpable or visible nodes otherwise and no other signal activity. The wound was irrigated and closed with 2-0 Vicryl in the deeper tissues and the skin with subcuticular 4-0 Vicryl. Ultrasound probe was brought up to the field. The area of the biopsy cavity was identified in the upper outer quadrant between the 10 and 11 o'clock position. After this was adequately identified, a small skin incision was made on 1 end and the Bard ultra wire was then positioned going  through the lesion to anchor this in place. Following this, a curvilinear incision was made from the medial aspect where the wire entrance was made along the upper outer quadrant towards the lateral aspect.  It was then deepened through the subcutaneous tissue and the skin and subcutaneous tissue was elevated on both sides. With the wire as a guide, a generous core of tissue was then excised out taking care to ensure that there was a palpable mass within the tissue and there was adequate margin. The closest margin appeared to be anterior and covered with some of the subcutaneous tissue, which was included in the specimen. The excised tissue was tagged for margins and sent to pathology for inking. After ensuring hemostasis, the wound was irrigated and closed with 2-0 Vicryl in the deeper tissues and the skin closed with subcuticular 4-0 Vicryl. Both incisions were covered with Dermabond. The procedure was well tolerated. She was subsequently extubated and returned to the recovery room in stable.  ____________________________ S.[name], MD sgs:sb D: 07/02/2013 13:30:25 ET T: 07/02/2013 15:06:56 ET JOB#: [id number]  cc: Synthia Innocent. Jamal Collin, MD, <Dictator> [facility] [name] MD ELECTRONICALLY SIGNED 07/02/2013 16:37

## 2015-04-17 NOTE — Op Note (Signed)
PATIENT NAME:  Abigail Shaw, Abigail Shaw MR#:  [id number] DATE OF BIRTH:  December 09, 1935  DATE OF PROCEDURE:  07/18/2013  PREOPERATIVE DIAGNOSIS: Carcinoma, right breast post lumpectomy.   OPERATION PERFORMED: Re-excision, right breast lumpectomy site for anterior margin.   SURGEON: S.G. Jamal Collin, M.D.   ANESTHESIA: General.   COMPLICATIONS: None.   ESTIMATED BLOOD LOSS: Minimal.   DRAINS: None.   DESCRIPTION OF PROCEDURE: The patient was put to sleep in the supine position on the operating table. The right breast was prepped and draped out as a sterile field. It was the anterior margin that was positive on the initial lumpectomy; therefore, an elliptical portion of skin was removed. Going around the incision on both sides from the medial to the lateral aspect, the ellipse of skin was incised and this was then cut perpendicular down to the surface of the lumpectomy cavity and then removed. The lateral end was tagged and sent to pathology. The lumpectomy cavity was sufficient enough for subsequent placement of a MammoSite. After ensuring hemostasis, the deeper tissue was closed with interrupted 2-0 Vicryl stitches and the skin with subcuticular 4-0 Vicryl covered with Dermabond. The procedure was well tolerated. She was subsequently extubated and returned to the recovery room in stable condition.   ____________________________ S.[name], MD sgs:aw D: 07/18/2013 10:04:05 ET T: 07/18/2013 10:46:00 ET JOB#: [id number]  cc: S.G. Jamal Collin, MD, <Dictator> [facility] [name] MD ELECTRONICALLY SIGNED 07/22/2013 7:08

## 2015-04-17 NOTE — Discharge Summary (Signed)
PATIENT NAME:  Abigail Shaw, Abigail Shaw MR#:  [id number] DATE OF BIRTH:  1935-04-16  DATE OF ADMISSION:  11/30/2013 DATE OF DISCHARGE:  12/02/2013  For a detailed note, please take a look at the history and physical done on admission by Dr. Valentino Nose.   DIAGNOSES AT DISCHARGE:  Acute hyponatremia. Nausea and vomiting, likely suspected to be due to underlying urinary tract infection. Urinary tract infection. Hypertension. History of breast cancer. Glaucoma. Hyperlipidemia.   DIET: The patient is being discharged on a low sodium, low fat diet.   ACTIVITY: As tolerated.   FOLLOWUP: With Dr. Jarome Lamas next 1 to 2 weeks.   DISCHARGE MEDICATIONS: Fosamax 70 mg weekly, dorzolamide/timolol eye drops 1 drop to each affected eye b.i.d., Lumigan 0.01% ophthalmic solution daily, cetirizine 10 mg daily, Flonase 1 spray to each nostril daily, lorazepam 0.5 mg at bedtime, Maxalt 10 mg daily as needed, biotin 5 mg daily, losartan 50 mg at bedtime, Crestor 5 mg at bedtime, fluoxetine 20 mg 2 caps daily, amlodipine 2.5 mg daily, AcipHex 20 mg daily, anastrozole 1 mg daily, Ceftin 250 mg b.i.d. x 3 days, Zofran 4 mg q.6 hours as needed.   HOSPITAL COURSE: This is a 80 year old female with medical problems as mentioned above, presented to the hospital with persistent nausea, vomiting and noted to be acutely hyponatremic.  1.  Acute hyponatremia. This was likely hypovolemic hyponatremia related to her nausea and vomiting. Her sodium was as low as 125. The patient received some IV fluids and her sodium has since then improved and normalized now.  2.  Nausea and vomiting. Initially, it was thought that her nausea and vomiting was related to Bactrim, which is what she was started on for a right breast cellulitis. Although the patient also complained of underlying dysuria and frequency, therefore, thought to possibly have underlying UTI although her urinalysis was negative. The patient was empirically started on IV ceftriaxone  for her UTI and has significantly improved since then. She was also placed on scheduled Zofran, placed on a clear liquid diet which seemed to have significantly improved her symptoms. Her diet was slowly advanced from a clear liquid diet to a regular diet, which she is currently tolerating well. She is being discharged on oral Ceftin for her UTI along with some Zofran as needed for nausea.  3.  Suspected urinary tract infection. Although the patient's urinalysis was not positive, she had dysuria and frequency, therefore empirically treated with IV ceftriaxone and is currently being discharged on oral Ceftin.  4.  Right breast cellulitis. This was present the patient prior to coming to the hospital as she was being treated as an outpatient on oral Bactrim. She is being discharged on oral Ceftin which should cover for her cellulitis too.  5.  Hypertension. The patient remained hemodynamically stable. She will continue her losartan, and Norvasc.  6.  Hyperlipidemia. The patient was maintained on her Crestor. She will resume that.  7.  Glaucoma. The patient was maintained on her Cosopt and Lumigan eye drops and she will also resume that.  8.  History of breast cancer. The patient was maintained on anastrozole. She will resume that upon discharge.   CODE STATUS: The patient is a full code.   TIME SPENT ON DISCHARGE: 40 minutes.   ____________________________ Belia Heman. Verdell Carmine, MD vjs:cs D: 12/02/2013 15:10:00 ET T: 12/02/2013 19:43:16 ET JOB#: [id number]  cc: Belia Heman. Verdell Carmine, MD, <Dictator> Irven Easterly. Kary Kos, MD Henreitta Leber MD ELECTRONICALLY SIGNED 12/05/2013 14:31

## 2015-04-17 NOTE — H&P (Signed)
PATIENT NAME:  Abigail Shaw, Abigail Shaw MR#:  [id number] DATE OF BIRTH:  1935/08/13  DATE OF ADMISSION:  11/30/2013  REFERRING PHYSICIAN:  Dr. Jimmye Norman.   PRIMARY CARE PHYSICIAN:  Dr. Kary Kos.   CHIEF COMPLAINT:  Nausea, vomiting.   HISTORY OF PRESENT ILLNESS:  This is a 80 year old Caucasian female with history of breast cancer status post lumpectomy, hypertension, hyperlipidemia who was recently started on Bactrim therapy for cellulitis over her right breast presenting with nausea, vomiting.  One day duration of nausea and vomiting, nonbloody, nonbilious emesis with associated by mouth intolerance and abdominal cramping, however denies any fevers, chills or recent sick contacts.  States that she has been progressively worsening as far as generalized weakness and fatigue since starting Bactrim approximately five days ago with poor by mouth intake for the entire duration.  She has improved somewhat after receiving 1 liter of normal saline in the Emergency Department, however still remains ill.  She is found to be hyponatremic at 125 currently without complaints.   REVIEW OF SYSTEMS:  CONSTITUTIONAL:  Denies fevers.  Positive for fatigue and generalized weakness.  EYES:  Denies blurry vision, double vision, eye pain.  EARS, NOSE, THROAT:  Denies tinnitus, ear pain, hearing loss.  RESPIRATORY:  Denies cough, wheeze, shortness of breath.  CARDIOVASCULAR:  Denies chest pain, palpitations, edema.  GASTROINTESTINAL:  Positive for nausea, vomiting.  Denies diarrhea, abdominal pain.  GENITOURINARY:  Denies dysuria, hematuria.  ENDOCRINE:  Denies nocturia or thyroid problems.  HEMATOLOGIC AND LYMPHATIC:  Denies easy bruising or bleeding.  SKIN:  Positive for redness over her lumpectomy incision.  No further rashes or lesions.  MUSCULOSKELETAL:  Denies pain in neck, back, shoulder, knees, hips, any arthritic symptoms.  NEUROLOGIC:  Denies paralysis, paresthesias.  PSYCHIATRIC:  Denies anxiety or depressive  symptoms.  Otherwise, full review of systems performed by me is negative.   PAST MEDICAL HISTORY:  Breast cancer status post lumpectomy, gastroesophageal reflux disease, depression, hypertension, hyperlipidemia.   SOCIAL HISTORY:  Denies alcohol, tobacco or drug usage.   FAMILY HISTORY:  Negative for cardiovascular or renal disease.   ALLERGIES:  No known drug allergies.   HOME MEDICATIONS:  Include AcipHex 20 mg by mouth daily, alendronate 70 mg by mouth weekly on Saturdays, Norvasc 2.5 mg by mouth daily, anastrozole 1 mg by mouth daily, biotin 5 mg by mouth daily, sertraline 10 mg by mouth daily, Crestor 5 mg by mouth at bedtime, dorzolamide/timolol optic solution 2/0.5% one drop to each eye 2 times daily, fluoxetine 20 mg by mouth 2 capsules daily, Flonase 50 mcg inhalation one spray daily, lorazepam 0.5 mg by mouth at bedtime, losartan 50 mg by mouth at bedtime, Lumigan 0.01% ophthalmic solution one drop to each eye at night, Maxalt 10 mg by mouth as needed for migraines.   PHYSICAL EXAMINATION: VITAL SIGNS:  Temperature 97.6, heart rate 63, respirations 18, blood pressure 167/77, saturating 99% on room air.  Weight 74.1 kg, BMI 25.6.  GENERAL:  Well-nourished, weak-appearing Caucasian female who is currently in no acute distress.  HEAD:  Normocephalic, atraumatic.  EYES:  Pupils equal, round, reactive to light.  Extraocular muscles intact.  No scleral icterus.  MOUTH:  Dry mucosal membranes.  Dentition intact.  No abscess noted.  EAR, NOSE, THROAT:  Clear without exudates.  No external lesions.  NECK:  Supple.  No thyromegaly.  No nodules.  No JVD.  PULMONARY:  Clear to auscultation bilaterally.  No wheezes, rubs or rhonchi.  No use of accessory muscles.  Good respiratory effort.  CHEST:  Nontender to palpation.  CARDIOVASCULAR:  S1, S2, regular rate and rhythm.  No murmurs, rubs or gallops.  No edema.  Pedal pulses 2+.  GASTROINTESTINAL:  Soft, nontender, nondistended.  No masses.   Positive bowel sounds.  No hepatosplenomegaly.  MUSCULOSKELETAL:  No swelling, clubbing or edema.  Range of motion full in all extremities.  NEUROLOGIC:  Cranial nerves II through XII intact.  No gross focal neurological deficits.  Sensation intact.  Reflexes are intact.  SKIN:  Area over right lumpectomy incision with surrounding erythema and warmth.  No ulcerations or other lesions.  No cyanosis.  Skin warm, dry.  Turgor is intact.  PSYCHIATRIC:  Mood and affect within normal limits.  The patient awake, alert, oriented x 3.   LABORATORY DATA:  EKG performed, normal sinus rhythm, heart rate 67.  No ST or T wave abnormalities.  Sodium 125, potassium 3.6, chloride 95, bicarb 22, BUN 13, creatinine 0.9, glucose 148.  LFTs within normal limits.  WBC 4.9, hemoglobin 11.5, platelets 279.  Urinalysis, WBCs 5, RBCs 26.  No evidence of infection.   ASSESSMENT AND PLAN:  A 80 year old Caucasian female presenting with nausea, vomiting and generalized weakness.  1.  Hyponatremia from gastrointestinal losses.  Sodium deficit of 370.5 mEq to correct to 135.  We will provide IV fluid hydration with normal saline at 60 mL an hour.  We will follow her sodium trend.  2.  Nausea.  Symptomatic treatment with Zofran.  3.  Cellulitis of the right breast.  We will change Bactrim to Keflex for remainder of her course as she appears to be intolerant to Bactrim therapy.  4.  Hypertension.  Continue with Norvasc.  5.  Anxiety and depression, not otherwise specified.  Continue with fluoxetine.  6.  VTE prophylaxis with heparin subQ.  7.  CODE STATUS:  THE PATIENT IS FULL CODE.   TIME SPENT:  45 minutes.    ____________________________ Aaron Mose. Nevea Spiewak, MD dkh:ea D: 11/30/2013 21:32:42 ET T: 11/30/2013 22:45:30 ET JOB#: [id number]  cc: Aaron Mose. Tiler Brandis, MD, <Dictator> Amelia Burgard Woodfin Ganja MD ELECTRONICALLY SIGNED 12/01/2013 3:03

## 2015-04-17 NOTE — Consult Note (Signed)
Reason for Visit: This 80 year old Female patient presents to the clinic for initial evaluation of  breast cancer .   Referred by Dr. Jamal Collin.  Diagnosis:  Chief Complaint/Diagnosis   80 year old female status was wide local excision and sentinel node biopsy for a stage I (T1 C. N0 M0) ER/PR positive HER-2/neu not overexpressed well-differentiated invasive mammary carcinoma  Pathology Report pathology report reviewed   Imaging Report mammograms ultrasound reviewed   Referral Report clinical no treated   Planned Treatment Regimen possible accelerated partial breast irradiation   HPI   patient is a 80 year old female who has a self-described fibrocystic disease of the breasts. She had a mammogram performed in May of 2014 and repeated in June of 2014 showinga new irregular mass measuring approximately 1.6 cm in the upper outer aspect of the right breast. Surgical consultation was recommended and patient underwent needle localization positive for invasive mammary carcinoma. Tumor was strongly ER/PR positive HER-2/neu not overexpressed. Underwent wide local excision and sentinel node biopsy. Tumor was 1.5 cm overall grade 1 with 2 sentinel lymph nodes negative for metastatic disease. One margin was positive for status post reexcision with negative margins. Patient has done well postoperatively although his complaining of significant right lower extremity radicular type pain does have a history of disc disease and lumbar spine. She has and Oncotype DX pending and I been asked to evaluate the patient for possibility of accelerated partial breast radiation.  Past Hx:    Breast Cancer, Right Breast:    GERD - Esophageal Reflux:    Arthritis:    Anemia:    Breast Cancer: right   Glaucoma:    Depression:    Migraines:    hypertension:    Right thumb surgery:    Bilateral Foot surgery:    Cataract Extraction:    Oophorectomy:    Hysterectomy:    Right breast lumpectomy, Wire  localization, SN biopsy: Jul 2014  Past, Family and Social History:  Past Medical History positive   Cardiovascular hypertension   Gastrointestinal GERD; irritable bowel syndrome   Neurological/Psychiatric depression; migraine   Past Surgical History bilateral foot surgery, lumpectomy, hysterectomy, oophorectomy, cataract etc. extraction, right thumb surgery   Past Medical History Comments rthritis, anemia, glaucoma   Family History positive   Family History Comments mother with premenopausal breast cancer at age 30 mother with prostate cancer   Social History noncontributory   Additional Past Medical and Surgical History accompanied by her husband today   Allergies:   No Known Allergies:   Home Meds:  Home Medications: Medication Instructions Status  alendronate 70 mg oral tablet 1 tab(s) orally once a week on Saturday Active  dorzolamide-timolol ophthalmic 2%-0.5% preservative-free ophthalmic solution 1 drop(s) to each affected eye 2 times a day Active  Lumigan 0.01% ophthalmic solution 1 drop(s) to each affected eye once a day (in the evening) Active  cetirizine 10 mg oral capsule 1 cap(s) orally once a day Active  fluticasone nasal 50 mcg/inh nasal spray 1 spray(s) nasal once a day Active  LORazepam 0.5 mg oral tablet 1 tab(s) orally once a day (at bedtime) Active  Maxalt 10 mg oral tablet 1 tab(s) orally once a day, As Needed migraine headaches Active  biotin 5 mg oral capsule 1 cap(s) orally once a day Active  losartan 50 mg oral tablet 1 tab(s) orally once a day (at bedtime) Active  Crestor 5 mg oral tablet 1 tab(s) orally every other day at bedtime Active  FLUoxetine 20 mg oral  capsule 2 cap(s) orally once a day Active  amLODIPine 2.5 mg oral tablet 1 tab(s) orally once a day (in the morning) Active  AcipHex 20 mg oral delayed release tablet 1 tab(s) orally once a day (in the morning) Active   Review of Systems:  General negative   Performance Status (ECOG) 0    Skin negative   Breast see HPI   Ophthalmologic negative   ENMT negative   Respiratory and Thorax negative   Cardiovascular negative   Gastrointestinal negative   Genitourinary negative   Hematology/Lymphatics negative   Endocrine negative   Allergic/Immunologic negative   Review of Systems   patient complains of significant right radicular pain. Also still somewhat tender in the breast area which we would expect. Otherwise according to the nurse's notesPatient denies any weight loss, fatigue, weakness, fever, chills or night sweats. Patient denies any loss of vision, blurred vision. Patient denies any ringing  of the ears or hearing loss. No irregular heartbeat. Patient denies heart murmur or history of fainting. Patient denies any chest pain or pain radiating to her upper extremities. Patient denies any shortness of breath, difficulty breathing at night, cough or hemoptysis. Patient denies any swelling in the lower legs. Patient denies any nausea vomiting, vomiting of blood, or coffee ground material in the vomitus. Patient denies any stomach pain. Patient states has had normal bowel movements no significant constipation or diarrhea. Patient denies any dysuria, hematuria or significant nocturia. Patient denies any problems walking, swelling in the joints or loss of balance. Patient denies any skin changes, loss of hair or loss of weight. Patient denies any excessive worrying or anxiety or significant depression. Patient denies any problems with insomnia. Patient denies excessive thirst, polyuria, polydipsia. Patient denies any swollen glands, patient denies easy bruising or easy bleeding. Patient denies any recent infections, allergies or URI. Patient "s visual fields have not changed significantly in recent time.  Nursing Notes:  Nursing Vital Signs and Chemo Nursing Nursing Notes: *CC Vital Signs Flowsheet:   04-Aug-14 13:20  Temp Temperature 97.8  Pulse Pulse 60  Respirations  Respirations 20  SBP SBP 145  DBP DBP 71  Pain Scale (0-10)  10  Current Weight (kg) (kg) 76.8   Physical Exam:  General/Skin/HEENT:  General normal   Skin normal   Eyes normal   ENMT normal   Head and Neck normal   Additional PE well-developed well-nourished female in NAD. He is having some moderate pain in of the right radicular type. Right breast is a wide local excision which is well-healed. No dominant mass or nodularity is noted in either breast into position examined. No axillary or supraclavicular adenopathy is appreciated. Lungs are clear to A&P cardiac examination shows regular rate and rhythm.   Breasts/Resp/CV/GI/GU:  Respiratory and Thorax normal   Cardiovascular normal   Gastrointestinal normal   Genitourinary normal   MS/Neuro/Psych/Lymph:  Musculoskeletal normal   Neurological normal   Lymphatics normal   Other Results:  Radiology Results: [place]:    09-Jun-14 15:31, US Breast Right  US Breast Right   REASON FOR EXAM:    av rt density  COMMENTS:       PROCEDURE: [place]  - US BREAST RIGHT  - Jun 03 2013  3:31PM     RESULT: Ultrasound right breast reveals a dominant 1.6 cm x 9 mm   heterogeneous mass in the upper outer aspect right breast correspond to   mammographic abnormality. Several small tiny subcentimeter nodular   densities  noted adjacent to this most likely represent benign   intramammary lymph nodes. Surgical evaluation of this mass suggested.    IMPRESSION:  1.6 heterogeneous mass in the upper outer aspect right   breast for which surgical evaluation is suggested as malignancy cannot be   excluded.  BI-RADS: Category 4 - Suspicious Abnormality - Biopsy Should Be Considered      Thank you for the oppurtunity to contribute to the care of your patient.        Verified By: Osa Craver, M.D., MD  LabUnknown:    09-Jun-14 14:06, Digital Additional Views Rt Breast [facility])  PACS Image     09-Jun-14 15:31, US Breast Right  PACS [place]:    09-Jun-14 14:06, Digital Additional Views Rt Breast (SCR)  Digital Additional Views Rt Breast (SCR)   REASON FOR EXAM:    av rt density  COMMENTS:       PROCEDURE: MAM - MAM DIG ADDVIEWS RT SCR  - Jun 03 2013  2:06PM     RESULT: Additional view mammograms reveal a large Mass in the upper outer   aspect of the right breast. Ultrasound reveals this to be a large solid   masses and surgical evaluation suggested as malignancy cannot be excluded.    IMPRESSION:  1.6cm solid mass upper outer aspect right breast. Malignancy   cannot be excluded.    BI-RADS: Category 4 - Suspicious Abnormality - Biopsy Should Be Considered    Thank you for the oppurtunity to contribute to the care of your patient.        Verified By: Osa Craver, M.D., MD   Assessment and Plan: Impression:   pathologic stage I ER/PR positive invasive memory carcinoma HER-2/neu negative status post wide local excision Patient is a candidate for accelerated partial breast radiation. Plan:   at this time I believe the patient a good candidate for accelerated partial breast irradiation. Based on her difficulty ambulating with her lower back pain I believe the 10 treatments of 340 cGy twice a day through the MammoSite catheter with high-dose rate remote afterloading would be at this excellent choice for her care. We have gone over both whole breast radiation as well as accelerated partial breast radiation the pros and cons of each and she is in favor of the accelerated partial breast irradiation. Side effects associated with the catheter placement, possible skin reaction, possible fatigue, and fibrosis in the lumpectomy cavity or explained in detail to the patient. She seems to comprehend her treatment plan well. I have contacted Dr. Angie Fava office for placement of the MammoSite catheter. Should we not be able to place a catheter or his is in close proximity to chest wall or skin we'll go ahead with whole  breast radiation as planned. Also depending Oncotype DX results recognition for chemotherapy may be made. Patient will certainly be a candidate for aromatase inhibitor after completion of radiation.  I would like to take this opportunity to thank you for allowing me to continue to participate in this patient's care.  CC Referral:  cc: Dr. Jamal Collin, Dr. [name]   Electronic Signatures: Baruch Gouty, Roda Shutters (MD)  (Signed 04-Aug-14 14:38)  Authored: HPI, Diagnosis, Past Hx, PFSH, Allergies, Home Meds, ROS, Nursing Notes, Physical Exam, Other Results, Encounter Assessment and Plan, CC Referring Physician   Last Updated: 04-Aug-14 14:38 by Armstead Peaks (MD)

## 2015-04-17 NOTE — Op Note (Signed)
PATIENT NAME:  Abigail Shaw, Abigail Shaw MR#:  [id number] DATE OF BIRTH:  07/20/1935  DATE OF PROCEDURE:  01/25/2013  PREOPERATIVE DIAGNOSIS:  Cataract, right eye.   POSTOPERATIVE DIAGNOSIS: Cataract, right eye.   PROCEDURE PERFORMED: Extracapsular cataract extraction using phacoemulsification with placement Alcon [id number], 24.5 diopter posterior chamber lens, serial #[id number].   SURGEON: Loura Back. Millena Callins, M.D.   ANESTHESIA: 4% lidocaine and 0.75% Marcaine, a 50-50 mixture of 10 units/mL of Hylenex added, given as a peribulbar.  ANESTHESIOLOGIST:  Dr. Andree Elk  COMPLICATIONS:  None.   ESTIMATED BLOOD LOSS:  Less than 1 mL  DESCRIPTION OF PROCEDURE:  The patient was brought to the operating room and given a peribulbar block.  The patient was then prepped and draped in the usual fashion.  The vertical rectus muscles were imbricated using 5-0 silk sutures.  These sutures were then clamped to the sterile drapes as bridle sutures.  A limbal peritomy was performed extending two clock hours and hemostasis was obtained with cautery.  A partial thickness scleral groove was made at the surgical limbus and dissected anteriorly in a lamellar dissection using an Alcon crescent knife.  The anterior chamber was entered superonasally with a Superblade and through the lamellar dissection with a 2.6 mm keratome.  DisCoVisc was used to replace the aqueous and a continuous tear capsulorrhexis was carried out.  Hydrodissection and hydrodelineation were carried out with balanced salt and a 27 gauge canula.  The nucleus was rotated to confirm the effectiveness of the hydrodissection.  Phacoemulsification was carried out using a divide-and-conquer technique.  Total ultrasound time was 1 minute 6 seconds with an average power of 24.2%, a CDE of 28.09.  No suture was placed.  Irrigation/aspiration was used to remove the residual cortex.  DisCoVisc was used to inflate the capsule and the internal incision was enlarged to 3  mm with the crescent knife.  The intraocular lens was folded and inserted into the capsular bag using the AcrySert delivery system.  Irrigation/aspiration was used to remove the residual DisCoVisc.  Miostat was injected into the anterior chamber through the paracentesis tract to inflate the anterior chamber and induce miosis.  A tenth of a milliliter of cefuroxime was injected under the anterior capsule through the paracentesis tract. The wound was checked for leaks and none were found. The conjunctiva was closed with cautery and the bridle sutures were removed.  Two drops of 0.3% Vigamox were placed on the eye.   An eye shield was placed on the eye.  The patient was discharged to the recovery room in good condition.   ____________________________ Loura Back Mileydi Milsap, MD sad:ce D: 01/25/2013 11:33:25 ET T: 01/25/2013 13:57:33 ET JOB#: [id number]  cc: Remo Lipps A. Carlis Blanchard, MD, <Dictator> Martie Lee MD ELECTRONICALLY SIGNED 01/28/2013 12:44

## 2015-05-06 ENCOUNTER — Encounter: Payer: Self-pay | Admitting: Podiatry

## 2015-05-06 ENCOUNTER — Ambulatory Visit (INDEPENDENT_AMBULATORY_CARE_PROVIDER_SITE_OTHER): Payer: Medicare Other | Admitting: Podiatry

## 2015-05-06 ENCOUNTER — Ambulatory Visit (INDEPENDENT_AMBULATORY_CARE_PROVIDER_SITE_OTHER): Payer: Medicare Other

## 2015-05-06 VITALS — Ht 63.0 in | Wt 163.0 lb

## 2015-05-06 DIAGNOSIS — M79671 Pain in right foot: Secondary | ICD-10-CM

## 2015-05-06 MED ORDER — METHYLPREDNISOLONE 4 MG PO TBPK: ORAL_TABLET | ORAL | Status: DC

## 2015-05-06 MED ORDER — MELOXICAM 15 MG PO TABS: 15.0000 mg | ORAL_TABLET | Freq: Every day | ORAL | Status: DC

## 2015-05-07 NOTE — Progress Notes (Signed)
She presents today with a several month history of pain to the bilateral heels. The surgical site to the left heel is doing wonderfully well however she does has pain on the plantar aspect of the right foot greater than the left. She states that it started in the heel and has not let up. She denies any trauma to the bilateral foot and states that they both started approximately the same time.  Objective: Vital signs are stable she is alert and oriented 3. Pulses are strongly palpable bilateral. Neurologic sensorium is intact bilateral. She has no pain on palpation of the Achilles tendon bilaterally. She does have severe pain on palpation medial calcaneal tubercles bilateral. Radiographic evaluation does demonstrate a plantar distally oriented calcaneal spur soft tissue increase in density at the plantar fascial calcaneal insertion sites. Consistent with plantar fasciitis are these findings.  Assessment: Plantar fasciitis bilateral.  Plan: Discussed etiology pathology conservative versus surgical therapies. Started her on a Medrol Dosepak to be followed by meloxicam. Injected the bilateral heels today with Kenalog and local anesthetic. Placed her in plantar fascial braces bilateral. Discussed appropriate shoe gear stretching sizes ice therapy and shoe gear modifications. Follow up with her in approximately 1 month.

## 2015-06-08 ENCOUNTER — Ambulatory Visit (INDEPENDENT_AMBULATORY_CARE_PROVIDER_SITE_OTHER): Payer: Medicare Other | Admitting: Podiatry

## 2015-06-08 ENCOUNTER — Encounter: Payer: Self-pay | Admitting: Podiatry

## 2015-06-08 VITALS — BP 96/69 | HR 65 | Resp 16

## 2015-06-08 DIAGNOSIS — M722 Plantar fascial fibromatosis: Secondary | ICD-10-CM

## 2015-06-08 NOTE — Progress Notes (Signed)
She presents today with her chief complaint of resolving plantar fasciitis bilateral.  Objective: Vital signs are stable she is alert and oriented 3. Pulses are palpable bilateral. She has moderate tenderness on palpation medial calcaneal tubercle bilateral. Pain.  Assessment: Resolving plantar fasciitis bilateral.  Plan: Injected the bilateral heels again today with Kenalog and local and aesthetic. Continue all conservative therapies and follow up with her 1 month if necessary.

## 2015-07-08 ENCOUNTER — Encounter: Payer: Self-pay | Admitting: General Surgery

## 2015-07-13 ENCOUNTER — Ambulatory Visit: Payer: Medicare Other | Admitting: Podiatry

## 2015-07-20 ENCOUNTER — Ambulatory Visit: Payer: Medicare Other | Admitting: Podiatry

## 2015-07-21 ENCOUNTER — Encounter: Payer: Self-pay | Admitting: General Surgery

## 2015-07-21 ENCOUNTER — Ambulatory Visit (INDEPENDENT_AMBULATORY_CARE_PROVIDER_SITE_OTHER): Payer: Medicare Other | Admitting: General Surgery

## 2015-07-21 VITALS — BP 140/78 | HR 70 | Resp 12 | Ht 63.0 in | Wt 166.0 lb

## 2015-07-21 DIAGNOSIS — C50411 Malignant neoplasm of upper-outer quadrant of right female breast: Secondary | ICD-10-CM

## 2015-07-21 NOTE — Patient Instructions (Addendum)
Continue monthly self breast exam. Call for any changes 

## 2015-07-21 NOTE — Progress Notes (Signed)
Patient ID: Abigail Shaw, female   DOB: 09/10/1935, 80 y.o.   MRN: [id number]  Chief Complaint  Patient presents with  . Follow-up    mammogram    HPI Abigail Shaw is a 80 y.o. female.who presents for a breast cancer follow up.The most recent mammogram was done on 07/07/15.  Patient does perform regular self breast checks and gets regular mammograms done.    HPI  Past Medical History  Diagnosis Date  . Hypertension   . IBS (irritable bowel syndrome)   . Breast cyst   . GERD (gastroesophageal reflux disease)   . Glaucoma   . Joint pain   . Cancer 2014    right breast cancer    Past Surgical History  Procedure Laterality Date  . Abdominal hysterectomy  1976  . Mm breast stereo bx*r*r/s Right 2014  . Breast surgery Right 07-02-2013    with sentinal biopsy  . Breast surgery Right 07-18-13  . Breast mammosite Right 08-01-13  . Foot surgery Left 12/05/14  . Colonoscopy      Dr Candace Cruise    Family History  Problem Relation Age of Onset  . Cancer Mother     age 23 breast  . Cancer Brother     prostate/lung/ liver /spleen  . Breast cancer Maternal Aunt     age 49  . Colon cancer Paternal Aunt     Social History History  Substance Use Topics  . Smoking status: Never Smoker   . Smokeless tobacco: Never Used  . Alcohol Use: No    Allergies  Allergen Reactions  . Lisinopril     Other reaction(s): Cough  . Sulfamethoxazole-Trimethoprim     Other reaction(s): Other (See Comments) HYPONATREMIA    Current Outpatient Prescriptions  Medication Sig Dispense Refill  . alendronate (FOSAMAX) 70 MG tablet Take 70 mg by mouth every 7 (seven) days.     Marland Kitchen amLODipine (NORVASC) 2.5 MG tablet Take 2.5 mg by mouth daily.    . Biotin 1000 MCG tablet Take 1,000 mcg by mouth daily.    . cetirizine (ZYRTEC) 10 MG tablet Take 10 mg by mouth at bedtime.    . dorzolamide-timolol (COSOPT) 22.3-6.8 MG/ML ophthalmic solution Place 1 drop into both eyes.    Marland Kitchen exemestane (AROMASIN) 25 MG  tablet Take 1 tablet by mouth daily.    Marland Kitchen FLUoxetine (PROZAC) 20 MG capsule Take 20 mg by mouth daily.     . fluticasone (FLONASE) 50 MCG/ACT nasal spray     . hydrochlorothiazide (MICROZIDE) 12.5 MG capsule Take 12.5 mg by mouth daily.    . IRON CR PO Take 25 mg by mouth once a week.    Marland Kitchen LORazepam (ATIVAN) 0.5 MG tablet Take 0.5 mg by mouth every 8 (eight) hours.    Marland Kitchen losartan (COZAAR) 50 MG tablet Take 50 mg by mouth daily.    Marland Kitchen LUMIGAN 0.01 % SOLN Place 1 drop into both eyes.    . meloxicam (MOBIC) 15 MG tablet Take 1 tablet (15 mg total) by mouth daily. 30 tablet 3  . Olopatadine HCl (PATADAY) 0.2 % SOLN as needed.    . RABEprazole (ACIPHEX) 20 MG tablet Take 20 mg by mouth daily.    . rizatriptan (MAXALT) 10 MG tablet Take 10 mg by mouth as needed for migraine. May repeat in 2 hours if needed    . rosuvastatin (CRESTOR) 5 MG tablet Take 5 mg by mouth every other day.     . triamcinolone cream (  KENALOG) 0.1 % 1 application as needed.    . Vitamin D, Ergocalciferol, (DRISDOL) 50000 UNITS CAPS capsule Take 1 capsule by mouth once a week.     No current facility-administered medications for this visit.    Review of Systems Review of Systems  Constitutional: Negative.   Respiratory: Negative.   Cardiovascular: Negative.     Blood pressure 140/78, pulse 70, resp. rate 12, height 5\' 3"  (1.6 m), weight 166 lb (75.297 kg).  Physical Exam Physical Exam  Constitutional: She is oriented to person, place, and time. She appears well-developed and well-nourished.  Eyes: Conjunctivae are normal. No scleral icterus.  Cardiovascular: Normal rate, regular rhythm and normal heart sounds.   Pulmonary/Chest: Effort normal. Right breast exhibits tenderness ( mild non  focal tenderness ). Right breast exhibits no inverted nipple, no mass, no nipple discharge and no skin change. Left breast exhibits tenderness ( mild non focal tenderness ). Left breast exhibits no inverted nipple, no nipple discharge  and no skin change.  Abdominal: Soft. Bowel sounds are normal. There is no tenderness.  Lymphadenopathy:    She has no cervical adenopathy.    She has no axillary adenopathy.  Neurological: She is alert and oriented to person, place, and time.  Skin: Skin is warm and dry.    Data Reviewed Mammogram reviewed  Assessment    Stable exam,CA right breast, T1,N0,M0. Er/PR pos, Her 2 neg. 17yrs post lumpectomy, sn biopsy and radiation. Currently on antihormonal therapy    Plan    The patient has been asked to return to the office in one year with a bilateral diagnostic mammogram.    PCP:  Dannial Monarch 07/21/2015, 8:30 PM

## 2015-07-23 ENCOUNTER — Ambulatory Visit: Payer: Medicare Other | Admitting: General Surgery

## 2015-08-06 ENCOUNTER — Telehealth: Payer: Self-pay | Admitting: *Deleted

## 2015-08-06 NOTE — Telephone Encounter (Signed)
I called patient and spoke with husband who will have her return my call

## 2015-08-06 NOTE — Telephone Encounter (Signed)
This patient has not been seen since January 2015 and has no FU appt scheduled. She no showed for her 06/03/14 appt with you. She was seen by Dr Jamal Collin in July 2016.  Per Dr Ma Hillock, check with patient to see if she will schedule a fu and give her a 30 day supply, or she can see if Dr Jamal Collin will order if she chooses not to return here

## 2015-08-07 MED ORDER — EXEMESTANE 25 MG PO TABS: 25.0000 mg | ORAL_TABLET | Freq: Every day | ORAL | Status: DC

## 2015-08-07 NOTE — Telephone Encounter (Signed)
Spoke with pt who has scheduled an appt for next week, # 30 exemestane tabs e scribed per VO Dr Ma Hillock

## 2015-08-14 ENCOUNTER — Other Ambulatory Visit: Payer: Self-pay

## 2015-08-14 ENCOUNTER — Inpatient Hospital Stay: Payer: Medicare Other | Attending: Internal Medicine | Admitting: Internal Medicine

## 2015-08-14 VITALS — BP 151/99 | HR 56 | Temp 97.1°F | Resp 16 | Wt 162.5 lb

## 2015-08-14 DIAGNOSIS — I10 Essential (primary) hypertension: Secondary | ICD-10-CM

## 2015-08-14 DIAGNOSIS — Z170 Estrogen receptor positive status [ER+]: Secondary | ICD-10-CM | POA: Insufficient documentation

## 2015-08-14 DIAGNOSIS — Z923 Personal history of irradiation: Secondary | ICD-10-CM | POA: Diagnosis not present

## 2015-08-14 DIAGNOSIS — E785 Hyperlipidemia, unspecified: Secondary | ICD-10-CM | POA: Diagnosis not present

## 2015-08-14 DIAGNOSIS — F329 Major depressive disorder, single episode, unspecified: Secondary | ICD-10-CM | POA: Insufficient documentation

## 2015-08-14 DIAGNOSIS — Z803 Family history of malignant neoplasm of breast: Secondary | ICD-10-CM | POA: Insufficient documentation

## 2015-08-14 DIAGNOSIS — D649 Anemia, unspecified: Secondary | ICD-10-CM | POA: Insufficient documentation

## 2015-08-14 DIAGNOSIS — Z79811 Long term (current) use of aromatase inhibitors: Secondary | ICD-10-CM | POA: Insufficient documentation

## 2015-08-14 DIAGNOSIS — Z79899 Other long term (current) drug therapy: Secondary | ICD-10-CM | POA: Diagnosis not present

## 2015-08-14 DIAGNOSIS — M810 Age-related osteoporosis without current pathological fracture: Secondary | ICD-10-CM | POA: Insufficient documentation

## 2015-08-14 DIAGNOSIS — C50911 Malignant neoplasm of unspecified site of right female breast: Secondary | ICD-10-CM | POA: Diagnosis present

## 2015-08-14 DIAGNOSIS — K219 Gastro-esophageal reflux disease without esophagitis: Secondary | ICD-10-CM

## 2015-08-14 DIAGNOSIS — C50411 Malignant neoplasm of upper-outer quadrant of right female breast: Secondary | ICD-10-CM

## 2015-08-14 DIAGNOSIS — K589 Irritable bowel syndrome without diarrhea: Secondary | ICD-10-CM | POA: Insufficient documentation

## 2015-08-14 DIAGNOSIS — H409 Unspecified glaucoma: Secondary | ICD-10-CM | POA: Insufficient documentation

## 2015-08-14 DIAGNOSIS — R232 Flushing: Secondary | ICD-10-CM | POA: Insufficient documentation

## 2015-08-14 DIAGNOSIS — M1990 Unspecified osteoarthritis, unspecified site: Secondary | ICD-10-CM | POA: Insufficient documentation

## 2015-08-14 DIAGNOSIS — F32A Depression, unspecified: Secondary | ICD-10-CM | POA: Insufficient documentation

## 2015-08-14 MED ORDER — EXEMESTANE 25 MG PO TABS: 25.0000 mg | ORAL_TABLET | Freq: Every day | ORAL | Status: DC

## 2015-08-14 NOTE — Progress Notes (Signed)
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 ER positive (80%), PR positive (90%).  HER-2/neu negative by FISH (Her2/CEP17 ratio 1.15). Oncotype DX test reported score of 18.  Started adjuvant hormonal therapy with Anastrazole in Aug 2014, changed to exemestane January 2015.    HISTORY OF PRESENT ILLNESS:  Patient returns for continued oncology evaluation, she was last seen in January 2015 after which she missed follow-up appointment. She was changed to exemestane last visit here, states that she continues to take this on a regular basis. She has hot flashes but states that it does not extremely bothersome. She denies any new joint pains or bone pains. She does not remember getting DEXA scan in the last few years but takes Fosamax, calcium plus D. She continues to follow with Dr. Jamal Collin, states that recent mammogram in July was unremarkable and that he did breast exam and was told it was unremarkable also. Denies new mood disturbances. Denies feeling any breast masses on self exam..   REVIEW OF SYSTEMS:   ROS As in HPI above. In addition, no fever, chills or sweats. No new headaches or focal weakness.  No sore throat or dysphagia. Denies new cough, shortness of breath, sputum, hemoptysis or chest pain. No abdominal pain, constipation, diarrhea, dysuria or hematuria. No new skin rash or bleeding symptoms. No new paresthesias in extremities.   PAST  MEDICAL HISTORY: Reviewed. Past Medical History  Diagnosis Date  . Hypertension   . IBS (irritable bowel syndrome)   . Breast cyst   . GERD (gastroesophageal reflux disease)   . Glaucoma   . Joint pain   . Cancer 2014    right breast cancer          Hypertension  Hyperlipidemia  Arthritis  Osteoporosis   GERD  Self-described fibrocystic disease of the breasts  Stage I carcinoma of the right breast s/p lumpectomy and sentinel nodes study on 07/02/13, followed by MammoSite treatment. ER/PR positive, HER-2/neu negative.  PAST SURGICAL HISTORY: Reviewed. Past Surgical History  Procedure Laterality Date  . Abdominal hysterectomy  1976  . Mm breast stereo bx*r*r/s Right 2014  . Breast surgery Right 07-02-2013    with sentinal biopsy  . Breast surgery Right 07-18-13  . Breast mammosite Right 08-01-13  . Foot surgery Left 12/05/14  . Colonoscopy      Dr Candace Cruise    FAMILY HISTORY: Reviewed. Family History  Problem Relation Age of Onset  . Cancer Mother     age 70 breast  . Cancer Brother     prostate/lung/ liver /spleen  . Breast cancer Maternal Aunt     age 10  . Colon cancer Paternal Aunt     SOCIAL HISTORY: Reviewed. Social History  Substance Use Topics  . Smoking status: Never Smoker   . Smokeless tobacco: Never Used  . Alcohol Use: No    Allergies  Allergen Reactions  . Lisinopril     Other reaction(s): Cough  . Sulfamethoxazole-Trimethoprim     Other reaction(s):  Other (See Comments) HYPONATREMIA    Current Outpatient Prescriptions  Medication Sig Dispense Refill  . alendronate (FOSAMAX) 70 MG tablet Take 70 mg by mouth every 7 (seven) days.     Marland Kitchen amLODipine (NORVASC) 2.5 MG tablet Take 2.5 mg by mouth daily.    . Biotin 1000 MCG tablet Take 1,000 mcg by mouth daily.    . cetirizine (ZYRTEC) 10 MG tablet Take 10 mg by mouth at bedtime.    . dorzolamide-timolol (COSOPT) 22.3-6.8 MG/ML ophthalmic solution Place 1 drop into both eyes.    Marland Kitchen exemestane (AROMASIN) 25  MG tablet Take 1 tablet (25 mg total) by mouth daily. 90 tablet 3  . FLUoxetine (PROZAC) 20 MG capsule Take 20 mg by mouth daily.     . fluticasone (FLONASE) 50 MCG/ACT nasal spray     . hydrochlorothiazide (MICROZIDE) 12.5 MG capsule Take 12.5 mg by mouth daily.    . IRON CR PO Take 25 mg by mouth once a week.    Marland Kitchen LORazepam (ATIVAN) 0.5 MG tablet Take 0.5 mg by mouth every 8 (eight) hours.    Marland Kitchen losartan (COZAAR) 50 MG tablet Take 50 mg by mouth daily.    Marland Kitchen LUMIGAN 0.01 % SOLN Place 1 drop into both eyes.    . meloxicam (MOBIC) 15 MG tablet Take 1 tablet (15 mg total) by mouth daily. 30 tablet 3  . Olopatadine HCl (PATADAY) 0.2 % SOLN as needed.    . RABEprazole (ACIPHEX) 20 MG tablet TAKE 1 TABLET (20 MG TOTAL) BY MOUTH ONCE DAILY.    . rizatriptan (MAXALT) 10 MG tablet Take 10 mg by mouth as needed for migraine. May repeat in 2 hours if needed    . rosuvastatin (CRESTOR) 5 MG tablet Take 5 mg by mouth every other day.     . triamcinolone cream (KENALOG) 0.1 % 1 application as needed.    . Vitamin D, Ergocalciferol, (DRISDOL) 50000 UNITS CAPS capsule Take 1 capsule by mouth once a week.     No current facility-administered medications for this visit.    PHYSICAL EXAM: Filed Vitals:   08/14/15 0904  BP: 151/99  Pulse: 56  Temp: 97.1 F (36.2 C)  Resp: 16     Body mass index is 28.79 kg/(m^2).    ECOG FS:0 - Asymptomatic  GENERAL: Patient is alert and oriented, in no acute distress. There is no icterus. HEENT: EOMs intact. Oral exam negative for thrush or lesions. No cervical lymphadenopathy. CVS: S1S2, regular LUNGS: Bilaterally clear to auscultation, no rhonchi. ABDOMEN: Soft, nontender. No hepatomegaly clinically.  EXTREMITIES: No pedal edema. BREASTS: Patient refused (states that exam done by Dr. Jamal Collin 2 weeks ago was unremarkable)  LAB RESULTS:  Refused lab draw today (states that she gets labs at primary physician visit next week).     Component Value Date/Time   NA  133* 12/09/2013 1024   K 4.0 12/09/2013 1024   CL 98 12/09/2013 1024   CO2 26 12/09/2013 1024   GLUCOSE 113* 12/09/2013 1024   BUN 15 12/09/2013 1024   CREATININE 0.83 12/09/2013 1024   CALCIUM 9.3 12/09/2013 1024   PROT 7.7 12/09/2013 1024   ALBUMIN 3.6 12/09/2013 1024   AST 26 12/09/2013 1024   ALT 42 12/09/2013 1024   ALKPHOS 91 12/09/2013 1024   BILITOT 0.2 12/09/2013 1024   GFRNONAA >60 12/09/2013 1024   GFRAA >60 12/09/2013 1024    Lab Results  Component Value Date   WBC 5.1 12/09/2013  NEUTROABS 3.1 12/09/2013   HGB 11.9* 12/09/2013   HCT 36.0 12/09/2013   MCV 81 12/09/2013   PLT 283 12/09/2013    STUDIES: 12/09/13 - CA 27.29 level normal at 10.5.   07/07/15 - Bilateral Mammogram (at [facility]): ASSESSMENT: BIRADS CATEGORY 2: BENIGN.   ASSESSMENT / PLAN:   1. 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 Tumor size 1.5 cm, grade 1, 2 sentinel nodes negative for metastasis. ER positive (80%), PR positive (90%).  HER-2/neu negative by FISH (Her2/CEP17 ratio 1.15). Oncotype DX test reportedly had a score of 18. Started adjuvant hormonal therapy with Anastrazole in Aug 2014 -   Reviewed recent mammogram report and d/w patient. Clinically no evidence to suggest recurrent breast cancer, she refused breast exam today as mentioned above. She is tolerating hormonal therapy with Exemestane better, continue this 25 mg by mouth daily. Have explained that total of 5 years hormonal therapy is planned, she is agreeable to this. Will see her back in 1 year with labs for continued monitoring and treatment planning. 2. History of mild osteoporosis - continues on Fosamax and calcium + vitamin D. Will need to monitor the osteoporosis once every 18 to 24 months. Per her request, will send request to her PMD to obtain next DEXA scan.  3. In between visits, she  was advised to call in case of any new side effects from exemestane, new breast masses felt on self exam, unintentional weight loss or other new  symptoms and will be evaluated sooner. She is agreeable to this plan.   Leia Alf, MD   08/14/2015 2:41 PM

## 2015-10-14 ENCOUNTER — Other Ambulatory Visit: Payer: Self-pay | Admitting: Podiatry

## 2015-10-20 ENCOUNTER — Other Ambulatory Visit: Payer: Self-pay | Admitting: Podiatry

## 2015-10-22 ENCOUNTER — Telehealth: Payer: Self-pay | Admitting: *Deleted

## 2015-10-22 NOTE — Telephone Encounter (Signed)
Pt must be reevaluated prior to future refills.

## 2015-10-22 NOTE — Telephone Encounter (Signed)
Pt states she's having trouble getting the refill for the Meloxicam.  Left message with female answering the phone that pt has refills of the Meloxicam and if continues to have problems needs an appt.

## 2016-01-17 ENCOUNTER — Other Ambulatory Visit: Payer: Self-pay | Admitting: Podiatry

## 2016-06-08 ENCOUNTER — Other Ambulatory Visit: Payer: Self-pay | Admitting: *Deleted

## 2016-06-08 DIAGNOSIS — C50411 Malignant neoplasm of upper-outer quadrant of right female breast: Secondary | ICD-10-CM

## 2016-06-08 MED ORDER — EXEMESTANE 25 MG PO TABS: 25.0000 mg | ORAL_TABLET | Freq: Every day | ORAL | Status: DC

## 2016-06-13 ENCOUNTER — Other Ambulatory Visit: Payer: Self-pay | Admitting: Neurology

## 2016-06-13 DIAGNOSIS — R410 Disorientation, unspecified: Secondary | ICD-10-CM

## 2016-07-08 ENCOUNTER — Ambulatory Visit
Admission: RE | Admit: 2016-07-08 | Discharge: 2016-07-08 | Disposition: A | Discharge status: Home / Self Care | Payer: Medicare Other | Source: Ambulatory Visit | Attending: Neurology | Admitting: Neurology

## 2016-07-08 DIAGNOSIS — R410 Disorientation, unspecified: Secondary | ICD-10-CM | POA: Diagnosis not present

## 2016-07-14 ENCOUNTER — Ambulatory Visit: Payer: BC Managed Care – PPO | Admitting: General Surgery

## 2016-07-15 ENCOUNTER — Encounter: Payer: Self-pay | Admitting: General Surgery

## 2016-07-20 ENCOUNTER — Ambulatory Visit (INDEPENDENT_AMBULATORY_CARE_PROVIDER_SITE_OTHER): Payer: BC Managed Care – PPO | Admitting: General Surgery

## 2016-07-20 ENCOUNTER — Encounter: Payer: Self-pay | Admitting: General Surgery

## 2016-07-20 VITALS — BP 122/68 | HR 60 | Resp 14 | Ht 63.0 in | Wt 165.0 lb

## 2016-07-20 DIAGNOSIS — C50411 Malignant neoplasm of upper-outer quadrant of right female breast: Secondary | ICD-10-CM | POA: Diagnosis not present

## 2016-07-20 NOTE — Progress Notes (Signed)
Patient ID: Abigail Shaw, female   DOB: 1935/08/02, 81 y.o.   MRN: [id number]  Chief Complaint  Patient presents with  . Follow-up    HPI Abigail Shaw is a 81 y.o. female who presents for a breast evaluation. The most recent mammogram was done on 07-15-16.  Tolerating AROMASIN with mild hot flashes. Patient does perform regular self breast checks and gets regular mammograms done.  No new breast issues. She has no new complaints. I have reviewed the history of present illness with the patient.  HPI  Past Medical History:  Diagnosis Date  . Breast cyst   . Cancer ([place]) 2014   right breast cancer  . GERD (gastroesophageal reflux disease)   . Glaucoma   . Hypertension   . IBS (irritable bowel syndrome)   . Joint pain     Past Surgical History:  Procedure Laterality Date  . ABDOMINAL HYSTERECTOMY  1976  . BREAST MAMMOSITE Right 08-01-13  . BREAST SURGERY Right 07-02-2013   with sentinal biopsy  . BREAST SURGERY Right 07-18-13  . COLONOSCOPY     Dr Candace Cruise  . FOOT SURGERY Left 12/05/14  . MM BREAST STEREO BX*R*R/S Right 2014    Family History  Problem Relation Age of Onset  . Cancer Mother     age 2 breast  . Cancer Brother     prostate/lung/ liver /spleen  . Breast cancer Maternal Aunt     age 4  . Colon cancer Paternal Aunt     Social History Social History  Substance Use Topics  . Smoking status: Never Smoker  . Smokeless tobacco: Never Used  . Alcohol use No    Allergies  Allergen Reactions  . Lisinopril     Other reaction(s): Cough  . Sulfamethoxazole-Trimethoprim     Other reaction(s): Other (See Comments) HYPONATREMIA    Current Outpatient Prescriptions  Medication Sig Dispense Refill  . alendronate (FOSAMAX) 70 MG tablet Take 70 mg by mouth every 7 (seven) days.     Marland Kitchen amLODipine (NORVASC) 2.5 MG tablet Take 2.5 mg by mouth daily.    Marland Kitchen aspirin 81 MG tablet Take 81 mg by mouth daily.    . Biotin 1000 MCG tablet Take 1,000 mcg by mouth daily.    .  cetirizine (ZYRTEC) 10 MG tablet Take 10 mg by mouth at bedtime.    . dorzolamide-timolol (COSOPT) 22.3-6.8 MG/ML ophthalmic solution Place 1 drop into both eyes.    Marland Kitchen exemestane (AROMASIN) 25 MG tablet Take 1 tablet (25 mg total) by mouth daily. 90 tablet 0  . FLUoxetine (PROZAC) 20 MG capsule Take 20 mg by mouth daily.     . fluticasone (FLONASE) 50 MCG/ACT nasal spray     . hydrochlorothiazide (MICROZIDE) 12.5 MG capsule Take 12.5 mg by mouth daily.    . IRON CR PO Take 25 mg by mouth once a week.    Marland Kitchen LORazepam (ATIVAN) 0.5 MG tablet Take 0.5 mg by mouth every 8 (eight) hours.    Marland Kitchen losartan (COZAAR) 50 MG tablet Take 50 mg by mouth daily.    Marland Kitchen LUMIGAN 0.01 % SOLN Place 1 drop into both eyes.    . meloxicam (MOBIC) 15 MG tablet TAKE 1 TABLET (15 MG TOTAL) BY MOUTH DAILY. 30 tablet 5  . Olopatadine HCl (PATADAY) 0.2 % SOLN as needed.    . RABEprazole (ACIPHEX) 20 MG tablet TAKE 1 TABLET (20 MG TOTAL) BY MOUTH ONCE DAILY.    . rizatriptan (MAXALT) 10 MG  tablet Take 10 mg by mouth as needed for migraine. May repeat in 2 hours if needed    . rosuvastatin (CRESTOR) 5 MG tablet Take 5 mg by mouth every other day.     . triamcinolone cream (KENALOG) 0.1 % 1 application as needed.    . Vitamin D, Ergocalciferol, (DRISDOL) 50000 UNITS CAPS capsule Take 1 capsule by mouth once a week.     No current facility-administered medications for this visit.     Review of Systems Review of Systems  Constitutional: Negative.   Respiratory: Negative.   Cardiovascular: Negative.     Blood pressure 122/68, pulse 60, resp. rate 14, height 5\' 3"  (1.6 m), weight 165 lb (74.8 kg).  Physical Exam Physical Exam  Constitutional: She is oriented to person, place, and time. She appears well-developed and well-nourished.  HENT:  Mouth/Throat: Oropharynx is clear and moist.  Eyes: Conjunctivae are normal. No scleral icterus.  Neck: Neck supple.  Cardiovascular: Normal rate, regular rhythm and normal heart  sounds.   Pulmonary/Chest: Effort normal and breath sounds normal. Right breast exhibits tenderness. Right breast exhibits no inverted nipple, no mass, no nipple discharge and no skin change. Left breast exhibits tenderness. Left breast exhibits no inverted nipple, no mass, no nipple discharge and no skin change.  mild upper outer bilateral breast tenderness.  Abdominal: Soft. Normal appearance and bowel sounds are normal. There is no hepatomegaly. There is no tenderness.  Lymphadenopathy:    She has no cervical adenopathy.    She has no axillary adenopathy.  Neurological: She is alert and oriented to person, place, and time.  Skin: Skin is warm and dry.  Psychiatric: Her behavior is normal.    Data Reviewed Prior notes, mmmogram reviewed  Assessment    Stable exam 2014 CA right breast, T1,N0,M0. Er/PR pos, Her 2 neg. 3 yrs post lumpectomy, sn biopsy and radiation.  Currently on antihormonal therapy, tolerating it well    Plan    The patient has been asked to return to the office in one year with a bilateral diagnostic mammogram.          This information has been scribed by Karie Fetch RN, BSN,BC.   Kallen Mccrystal G 07/20/2016, 3:24 PM

## 2016-07-20 NOTE — Patient Instructions (Addendum)
The patient is aware to call back for any questions or concerns. The patient has been asked to return to the office in one year with a bilateral diagnostic mammogram. 

## 2016-08-12 ENCOUNTER — Other Ambulatory Visit: Payer: BC Managed Care – PPO

## 2016-08-12 ENCOUNTER — Ambulatory Visit: Payer: BC Managed Care – PPO | Admitting: Internal Medicine

## 2016-08-17 ENCOUNTER — Encounter: Payer: Self-pay | Admitting: *Deleted

## 2016-08-17 NOTE — Discharge Instructions (Signed)

## 2016-08-22 ENCOUNTER — Encounter
Admission: RE | Disposition: A | Discharge status: Home / Self Care | Payer: Self-pay | Source: Ambulatory Visit | Attending: Ophthalmology

## 2016-08-22 ENCOUNTER — Ambulatory Visit
Admission: RE | Admit: 2016-08-22 | Discharge: 2016-08-22 | Disposition: A | Discharge status: Home / Self Care | Payer: Medicare Other | Source: Ambulatory Visit | Attending: Ophthalmology | Admitting: Ophthalmology

## 2016-08-22 ENCOUNTER — Ambulatory Visit: Payer: Medicare Other | Admitting: Anesthesiology

## 2016-08-22 DIAGNOSIS — E7800 Pure hypercholesterolemia, unspecified: Secondary | ICD-10-CM | POA: Insufficient documentation

## 2016-08-22 DIAGNOSIS — D649 Anemia, unspecified: Secondary | ICD-10-CM | POA: Diagnosis not present

## 2016-08-22 DIAGNOSIS — H4010X1 Unspecified open-angle glaucoma, mild stage: Secondary | ICD-10-CM | POA: Insufficient documentation

## 2016-08-22 DIAGNOSIS — K589 Irritable bowel syndrome without diarrhea: Secondary | ICD-10-CM | POA: Diagnosis not present

## 2016-08-22 DIAGNOSIS — Z90710 Acquired absence of both cervix and uterus: Secondary | ICD-10-CM | POA: Diagnosis not present

## 2016-08-22 DIAGNOSIS — Z853 Personal history of malignant neoplasm of breast: Secondary | ICD-10-CM | POA: Diagnosis not present

## 2016-08-22 DIAGNOSIS — Z888 Allergy status to other drugs, medicaments and biological substances status: Secondary | ICD-10-CM | POA: Insufficient documentation

## 2016-08-22 DIAGNOSIS — Z881 Allergy status to other antibiotic agents status: Secondary | ICD-10-CM | POA: Insufficient documentation

## 2016-08-22 DIAGNOSIS — G43909 Migraine, unspecified, not intractable, without status migrainosus: Secondary | ICD-10-CM | POA: Insufficient documentation

## 2016-08-22 DIAGNOSIS — F329 Major depressive disorder, single episode, unspecified: Secondary | ICD-10-CM | POA: Diagnosis not present

## 2016-08-22 DIAGNOSIS — I10 Essential (primary) hypertension: Secondary | ICD-10-CM | POA: Insufficient documentation

## 2016-08-22 DIAGNOSIS — I499 Cardiac arrhythmia, unspecified: Secondary | ICD-10-CM | POA: Diagnosis not present

## 2016-08-22 DIAGNOSIS — Z9849 Cataract extraction status, unspecified eye: Secondary | ICD-10-CM | POA: Insufficient documentation

## 2016-08-22 HISTORY — PX: PHOTOCOAGULATION WITH LASER: SHX6027

## 2016-08-22 HISTORY — DX: Headache, unspecified: R51.9

## 2016-08-22 HISTORY — DX: Presence of dental prosthetic device (complete) (partial): Z97.2

## 2016-08-22 HISTORY — DX: Pure hypercholesterolemia, unspecified: E78.00

## 2016-08-22 HISTORY — DX: Headache: R51

## 2016-08-22 HISTORY — DX: Major depressive disorder, single episode, unspecified: F32.9

## 2016-08-22 HISTORY — DX: Motion sickness, initial encounter: T75.3XXA

## 2016-08-22 HISTORY — DX: Depression, unspecified: F32.A

## 2016-08-22 SURGERY — PHOTOCOAGULATION, EYE, USING LASER
Anesthesia: Monitor Anesthesia Care | Laterality: Left | Wound class: Clean Contaminated

## 2016-08-22 MED ORDER — TETRACAINE HCL 0.5 % OP SOLN
OPHTHALMIC | Status: DC | PRN
  Administered 2016-08-22: 2 [drp] via OPHTHALMIC

## 2016-08-22 MED ORDER — ALFENTANIL 500 MCG/ML IJ INJ
INJECTION | INTRAMUSCULAR | Status: DC | PRN
  Administered 2016-08-22: 700 ug via INTRAVENOUS

## 2016-08-22 MED ORDER — LACTATED RINGERS IV SOLN: INTRAVENOUS | Status: DC

## 2016-08-22 MED ORDER — PROPOFOL 10 MG/ML IV BOLUS
INTRAVENOUS | Status: DC | PRN
  Administered 2016-08-22: 20 mg via INTRAVENOUS

## 2016-08-22 SURGICAL SUPPLY — 11 items
BANDAGE EYE OVAL (MISCELLANEOUS) ×6 IMPLANT
DEVICE G-PROBE SGL USE (Laser) ×1 IMPLANT
DEVICE MICRO PULS P3 SGL USE (Laser) IMPLANT
G-PROBE SGL USE (Laser) ×3
GAUZE SPONGE 4X4 12PLY STRL (GAUZE/BANDAGES/DRESSINGS) ×3 IMPLANT
NDL RETROBULBAR .5 NSTRL (NEEDLE) ×3 IMPLANT
NEEDLE FILTER BLUNT 18X 1/2SAF (NEEDLE) ×2
NEEDLE FILTER BLUNT 18X1 1/2 (NEEDLE) ×1 IMPLANT
SYRINGE 10CC LL (SYRINGE) ×3 IMPLANT
WATER STERILE IRR 250ML POUR (IV SOLUTION) ×3 IMPLANT
WATER STERILE IRR 500ML POUR (IV SOLUTION) IMPLANT

## 2016-08-22 NOTE — Op Note (Signed)
DATE OF SURGERY: 08/22/2016  PREOPERATIVE DIAGNOSES: Mild stage pseudoexfoliation open angle glaucoma, left eye  POSTOPERATIVE DIAGNOSES: Same  PROCEDURES PERFORMED: Transscleral diode cyclophotocoagulation, left eye  SURGEON: Almon Hercules, M.D.  ANESTHESIA: MAC  COMPLICATIONS: None.  INDICATIONS FOR PROCEDURE: Abigail Shaw is a 81 y.o. year-old female with uncontrolled open angle glaucoma. The risks and benefits of glaucoma surgery were discussed with the patient, and she consented for a diode laser surgery.  PROCEDURE IN DETAIL: The eye for surgery was verified during the time-out procedure in the operating room.Monitored anesthesia care was initiated. A micropulse probe was applied to each hemilimbus with the following settings: 2040mW, 31.3% duty cycle, 80 seconds x 3. The patient tolerated the procedure well and was transferred to the Post-operative Care Unit in stable condition.

## 2016-08-22 NOTE — H&P (Signed)
H+P reviewed and is up to date, please see paper chart.  

## 2016-08-22 NOTE — Anesthesia Preprocedure Evaluation (Signed)
Anesthesia Evaluation  Patient identified by MRN, date of birth, ID band Patient awake    Reviewed: Allergy & Precautions, H&P , NPO status , Patient's Chart, lab work & pertinent test results  History of Anesthesia Complications Negative for: history of anesthetic complications  Airway Mallampati: II  TM Distance: >3 FB Neck ROM: full    Dental no notable dental hx.    Pulmonary neg pulmonary ROS,    Pulmonary exam normal        Cardiovascular hypertension, On Medications Normal cardiovascular exam     Neuro/Psych  Headaches,    GI/Hepatic Neg liver ROS, Medicated,  Endo/Other  negative endocrine ROS  Renal/GU negative Renal ROS     Musculoskeletal   Abdominal   Peds  Hematology  (+) anemia ,   Anesthesia Other Findings   Reproductive/Obstetrics                             Anesthesia Physical Anesthesia Plan  ASA: II  Anesthesia Plan: MAC   Post-op Pain Management:    Induction:   Airway Management Planned:   Additional Equipment:   Intra-op Plan:   Post-operative Plan:   Informed Consent: I have reviewed the patients History and Physical, chart, labs and discussed the procedure including the risks, benefits and alternatives for the proposed anesthesia with the patient or authorized representative who has indicated his/her understanding and acceptance.     Plan Discussed with:   Anesthesia Plan Comments:         Anesthesia Quick Evaluation

## 2016-08-22 NOTE — Anesthesia Procedure Notes (Signed)
Procedure Name: MAC Performed by: Cartrell Bentsen Pre-anesthesia Checklist: Patient identified, Emergency Drugs available, Suction available, Timeout performed and Patient being monitored Patient Re-evaluated:Patient Re-evaluated prior to inductionOxygen Delivery Method: Nasal cannula Placement Confirmation: positive ETCO2     

## 2016-08-22 NOTE — Anesthesia Postprocedure Evaluation (Signed)
Anesthesia Post Note  Patient: Abigail Shaw  Procedure(s) Performed: Procedure(s) (LRB): PHOTOCOAGULATION WITH LASER (Left)  Patient location during evaluation: PACU Anesthesia Type: MAC Level of consciousness: awake Pain management: pain level controlled Vital Signs Assessment: post-procedure vital signs reviewed and stable Respiratory status: spontaneous breathing Cardiovascular status: stable Postop Assessment: no headache Anesthetic complications: no    Jaci Standard, III,  Wyn Nettle D

## 2016-08-22 NOTE — OR Nursing (Signed)
A micropulse probe was applied to each hemilimbus with the following settings: 2062mW, 31.3% duty cycle, 80 seconds x 3.  The patient only received tetracaine during this procedure.

## 2016-08-22 NOTE — Transfer of Care (Signed)
Immediate Anesthesia Transfer of Care Note  Patient: [name]  Procedure(s) Performed: Procedure(s) with comments: PHOTOCOAGULATION WITH LASER (Left) - KEEP PT 2ND  Patient Location: PACU  Anesthesia Type: MAC  Level of Consciousness: awake, alert  and patient cooperative  Airway and Oxygen Therapy: Patient Spontanous Breathing and Patient connected to supplemental oxygen  Post-op Assessment: Post-op Vital signs reviewed, Patient's Cardiovascular Status Stable, Respiratory Function Stable, Patent Airway and No signs of Nausea or vomiting  Post-op Vital Signs: Reviewed and stable  Complications: No apparent anesthesia complications

## 2016-08-23 ENCOUNTER — Encounter: Payer: Self-pay | Admitting: Ophthalmology

## 2016-09-06 ENCOUNTER — Ambulatory Visit: Payer: BC Managed Care – PPO | Admitting: Diagnostic Neuroimaging

## 2016-09-09 ENCOUNTER — Emergency Department
Admission: EM | Admit: 2016-09-09 | Discharge: 2016-09-09 | Disposition: A | Discharge status: Home / Self Care | Payer: Medicare Other | Attending: Emergency Medicine | Admitting: Emergency Medicine

## 2016-09-09 ENCOUNTER — Emergency Department: Payer: Medicare Other

## 2016-09-09 ENCOUNTER — Encounter: Payer: Self-pay | Admitting: Emergency Medicine

## 2016-09-09 DIAGNOSIS — R1011 Right upper quadrant pain: Secondary | ICD-10-CM | POA: Diagnosis present

## 2016-09-09 DIAGNOSIS — Z79899 Other long term (current) drug therapy: Secondary | ICD-10-CM | POA: Insufficient documentation

## 2016-09-09 DIAGNOSIS — Z853 Personal history of malignant neoplasm of breast: Secondary | ICD-10-CM | POA: Diagnosis not present

## 2016-09-09 DIAGNOSIS — Z7982 Long term (current) use of aspirin: Secondary | ICD-10-CM | POA: Diagnosis not present

## 2016-09-09 DIAGNOSIS — I10 Essential (primary) hypertension: Secondary | ICD-10-CM | POA: Insufficient documentation

## 2016-09-09 DIAGNOSIS — R109 Unspecified abdominal pain: Secondary | ICD-10-CM

## 2016-09-09 LAB — URINALYSIS COMPLETE WITH MICROSCOPIC (ARMC ONLY)
BILIRUBIN URINE: NEGATIVE
Bacteria, UA: NONE SEEN
Glucose, UA: NEGATIVE mg/dL
Hgb urine dipstick: NEGATIVE
KETONES UR: NEGATIVE mg/dL
Leukocytes, UA: NEGATIVE
Nitrite: NEGATIVE
PROTEIN: NEGATIVE mg/dL
Specific Gravity, Urine: 1.011 (ref 1.005–1.030)
Squamous Epithelial / LPF: NONE SEEN
pH: 6 (ref 5.0–8.0)

## 2016-09-09 LAB — COMPREHENSIVE METABOLIC PANEL
ALBUMIN: 3.9 g/dL (ref 3.5–5.0)
ALK PHOS: 79 U/L (ref 38–126)
ALT: 18 U/L (ref 14–54)
ANION GAP: 6 (ref 5–15)
AST: 18 U/L (ref 15–41)
BILIRUBIN TOTAL: 0.6 mg/dL (ref 0.3–1.2)
BUN: 14 mg/dL (ref 6–20)
CALCIUM: 9.5 mg/dL (ref 8.9–10.3)
CO2: 28 mmol/L (ref 22–32)
CREATININE: 0.74 mg/dL (ref 0.44–1.00)
Chloride: 98 mmol/L — ABNORMAL LOW (ref 101–111)
GFR calc non Af Amer: >60 mL/min (ref >60)
GLUCOSE: 97 mg/dL (ref 65–99)
POTASSIUM: 4 mmol/L (ref 3.5–5.1)
SODIUM: 132 mmol/L — AB (ref 135–145)
Total Protein: 6.9 g/dL (ref 6.5–8.1)

## 2016-09-09 LAB — CBC
HCT: 33.5 % — ABNORMAL LOW (ref 35.0–47.0)
Hemoglobin: 11.2 g/dL — ABNORMAL LOW (ref 12.0–16.0)
MCH: 25.8 pg — AB (ref 26.0–34.0)
MCHC: 33.5 g/dL (ref 32.0–36.0)
MCV: 76.9 fL — ABNORMAL LOW (ref 80.0–100.0)
PLATELETS: 268 10*3/uL (ref 150–440)
RBC: 4.36 MIL/uL (ref 3.80–5.20)
RDW: 16.7 % — AB (ref 11.5–14.5)
WBC: 6 10*3/uL (ref 3.6–11.0)

## 2016-09-09 LAB — TROPONIN I: Troponin I: <0.03 ng/mL (ref <0.03)

## 2016-09-09 LAB — LIPASE, BLOOD: Lipase: 40 U/L (ref 11–51)

## 2016-09-09 MED ORDER — IOPAMIDOL (ISOVUE-300) INJECTION 61%
100.0000 mL | Freq: Once | INTRAVENOUS | Status: AC | PRN
  Administered 2016-09-09: 100 mL via INTRAVENOUS

## 2016-09-09 MED ORDER — ACETAMINOPHEN 325 MG PO TABS
650.0000 mg | ORAL_TABLET | Freq: Once | ORAL | Status: AC
  Administered 2016-09-09: 650 mg via ORAL
  Filled 2016-09-09: qty 2

## 2016-09-09 MED ORDER — IOPAMIDOL (ISOVUE-300) INJECTION 61%
30.0000 mL | Freq: Once | INTRAVENOUS | Status: AC | PRN
  Administered 2016-09-09: 30 mL via ORAL

## 2016-09-09 MED ORDER — ONDANSETRON HCL 4 MG/2ML IJ SOLN
4.0000 mg | Freq: Once | INTRAMUSCULAR | Status: AC
  Administered 2016-09-09: 4 mg via INTRAVENOUS

## 2016-09-09 MED ORDER — ONDANSETRON HCL 4 MG/2ML IJ SOLN
INTRAMUSCULAR | Status: AC
  Administered 2016-09-09: 4 mg via INTRAVENOUS
  Filled 2016-09-09: qty 2

## 2016-09-09 MED ORDER — ONDANSETRON 4 MG PO TBDP
4.0000 mg | ORAL_TABLET | Freq: Once | ORAL | Status: AC
  Administered 2016-09-09: 4 mg via ORAL
  Filled 2016-09-09: qty 1

## 2016-09-09 NOTE — ED Provider Notes (Signed)
[facility] Emergency Department Provider Note ____________________________________________   I have reviewed the triage vital signs and the triage nursing note.  HISTORY  Chief Complaint Abdominal Pain (RUQ)   Historian Patient  HPI Abigail Shaw is a 81 y.o. female here for evaluation of right lower posterior chest discomfort over the past approximately 3 weeks, this morning wrapping around to right upper quadrant.  Initially she thought might be due to muscle strain, but as weeks passed, she does not think this is the cause.  Had shingles in this location several years ago -- no new rash.  No hx of gallstones.  Some nausea at times, including today.  No diarrhea or constipation. No fever, cough or trouble breathing.  Worse with movement of trunk.  It's been waking her up at night recently. Symptoms are moderate.    Past Medical History:  Diagnosis Date  . Breast cyst   . Cancer ([place]) 2014   right breast cancer  . Depression   . GERD (gastroesophageal reflux disease)   . Glaucoma   . Headache    migraines  . Hypercholesteremia   . Hypertension   . IBS (irritable bowel syndrome)   . Joint pain   . Motion sickness    ferry, cars  . Wears dentures    partial upper (1 tooth)    Patient Active Problem List   Diagnosis Date Noted  . Absolute anemia 08/14/2015  . Arthritis 08/14/2015  . Clinical depression 08/14/2015  . Glaucoma 08/14/2015  . HLD (hyperlipidemia) 08/14/2015  . BP (high blood pressure) 08/14/2015  . Pain in shoulder 12/02/2014  . 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 Malignant neoplasm of upper-outer quadrant of female breast, T1,N0,M0. ER/PR pos, Her 2 neg 06/26/2013    Past Surgical History:  Procedure Laterality Date  . ABDOMINAL HYSTERECTOMY  1976  . BREAST MAMMOSITE Right 08-01-13  . BREAST SURGERY Right 07-02-2013   with sentinal biopsy  . BREAST SURGERY Right 07-18-13  . CATARACT EXTRACTION W/ INTRAOCULAR LENS   IMPLANT, BILATERAL    . COLONOSCOPY     Dr Candace Cruise  . FOOT SURGERY Left 12/05/14  . MM BREAST STEREO BX*R*R/S Right 2014  . PHOTOCOAGULATION WITH LASER Left 08/22/2016   Procedure: PHOTOCOAGULATION WITH LASER;  Surgeon: Ronnell Freshwater, MD;  Location: [place];  Service: Ophthalmology;  Laterality: Left;  KEEP PT 2ND    Prior to Admission medications   Medication Sig Start Date End Date Taking? Authorizing Provider  alendronate (FOSAMAX) 70 MG tablet Take 70 mg by mouth every 7 (seven) days.  08/09/13  Yes Historical Provider, MD  amLODipine (NORVASC) 2.5 MG tablet Take 2.5 mg by mouth daily.   Yes Historical Provider, MD  aspirin 81 MG tablet Take 81 mg by mouth every evening.    Yes Historical Provider, MD  Biotin 1000 MCG tablet Take 1,000 mcg by mouth daily.   Yes Historical Provider, MD  cetirizine (ZYRTEC) 10 MG tablet Take 10 mg by mouth every evening.    Yes Historical Provider, MD  dorzolamide-timolol (COSOPT) 22.3-6.8 MG/ML ophthalmic solution Place 1 drop into both eyes 2 (two) times daily.  06/29/13  Yes Historical Provider, MD  exemestane (AROMASIN) 25 MG tablet Take 1 tablet (25 mg total) by mouth daily. 06/08/16  Yes Cammie Sickle, MD  FLUoxetine (PROZAC) 20 MG capsule Take 20 mg by mouth daily.    Yes Historical Provider, MD  fluticasone (FLONASE) 50 MCG/ACT nasal spray Place 2 sprays into both  nostrils daily.  07/15/13  Yes Historical Provider, MD  LORazepam (ATIVAN) 0.5 MG tablet Take 0.5 mg by mouth at bedtime.    Yes Historical Provider, MD  losartan (COZAAR) 50 MG tablet Take 50 mg by mouth every evening.    Yes Historical Provider, MD  Olopatadine HCl (PATADAY) 0.2 % SOLN Place 1 drop into both eyes as needed.  06/12/14  Yes Historical Provider, MD  RABEprazole (ACIPHEX) 20 MG tablet TAKE 1 TABLET (20 MG TOTAL) BY MOUTH ONCE DAILY. 08/06/15  Yes Historical Provider, MD  rosuvastatin (CRESTOR) 5 MG tablet Take 5 mg by mouth every other day.    Yes Historical  Provider, MD  Travoprost, BAK Free, (TRAVATAN) 0.004 % SOLN ophthalmic solution Place 1 drop into both eyes at bedtime.   Yes Historical Provider, MD  Vitamin D, Ergocalciferol, (DRISDOL) 50000 UNITS CAPS capsule Take 1 capsule by mouth once a week. 06/14/14  Yes Historical Provider, MD  meloxicam (MOBIC) 15 MG tablet TAKE 1 TABLET (15 MG TOTAL) BY MOUTH DAILY. 10/22/15   Max T Hyatt, DPM  rizatriptan (MAXALT) 10 MG tablet Take 10 mg by mouth as needed for migraine. May repeat in 2 hours if needed    Historical Provider, MD    Allergies  Allergen Reactions  . Lisinopril Cough       . Simbrinza [Brinzolamide-Brimonidine]     lethargy  . Sulfamethoxazole-Trimethoprim     Other reaction(s): Other (See Comments) HYPONATREMIA    Family History  Problem Relation Age of Onset  . Cancer Mother     age 91 breast  . Cancer Brother     prostate/lung/ liver /spleen  . Breast cancer Maternal Aunt     age 11  . Colon cancer Paternal Aunt     Social History Social History  Substance Use Topics  . Smoking status: Never Smoker  . Smokeless tobacco: Never Used  . Alcohol use No    Review of Systems  Constitutional: Negative for fever. Eyes: Negative for visual changes. ENT: Negative for sore throat. Cardiovascular: Negative for chest pain. Respiratory: Negative for shortness of breath. Gastrointestinal:no black or bloody stools Genitourinary: Negative for dysuria. Musculoskeletal: Right flank pain as per hpi Skin: Negative for rash. Neurological: Negative for headache. 10 point Review of Systems otherwise negative ____________________________________________   PHYSICAL EXAM:  VITAL SIGNS: ED Triage Vitals  Enc Vitals Group     BP 09/09/16 1022 135/66     Pulse Rate 09/09/16 1022 (!) 50     Resp 09/09/16 1022 16     Temp 09/09/16 1022 98 F (36.7 C)     Temp Source 09/09/16 1022 Oral     SpO2 09/09/16 1022 100 %     Weight 09/09/16 1023 166 lb (75.3 kg)     Height  09/09/16 1023 5\' 3"  (1.6 m)     Head Circumference --      Peak Flow --      Pain Score 09/09/16 1026 8     Pain Loc --      Pain Edu? --      Excl. in [place]? --      Constitutional: Alert and oriented. Well appearing and in no distress. HEENT   Head: Normocephalic and atraumatic.      Eyes: Conjunctivae are normal. PERRL. Normal extraocular movements.      Ears:         Nose: No congestion/rhinnorhea.   Mouth/Throat: Mucous membranes are moist.   Neck: No stridor. Cardiovascular/Chest:  Normal rate, regular rhythm.  No murmurs, rubs, or gallops.  Respiratory: Normal respiratory effort without tachypnea nor retractions. Breath sounds are clear and equal bilaterally. No wheezes/rales/rhonchi. Some tenderness at right flank/lower chest posteriorly. Gastrointestinal: Soft. No distention, no guarding, no rebound. Nontender.   Genitourinary/rectal:Deferred Musculoskeletal: Nontender with normal range of motion in all extremities. No joint effusions.  No lower extremity tenderness.  No edema. Neurologic:  Normal speech and language. No gross or focal neurologic deficits are appreciated. Skin:  Skin is warm, dry and intact. No rash noted. Psychiatric: Mood and affect are normal. Speech and behavior are normal. Patient exhibits appropriate insight and judgment.   ____________________________________________  LABS (pertinent positives/negatives)  Labs Reviewed  COMPREHENSIVE METABOLIC PANEL - Abnormal; Notable for the following:       Result Value   Sodium 132 (*)    Chloride 98 (*)    All other components within normal limits  CBC - Abnormal; Notable for the following:    Hemoglobin 11.2 (*)    HCT 33.5 (*)    MCV 76.9 (*)    MCH 25.8 (*)    RDW 16.7 (*)    All other components within normal limits  URINALYSIS COMPLETEWITH MICROSCOPIC ([facility] ONLY) - Abnormal; Notable for the following:    Color, Urine YELLOW (*)    APPearance CLEAR (*)    All other components within  normal limits  LIPASE, BLOOD  TROPONIN I    ____________________________________________    EKG I, Lisa Roca, MD, the attending physician have personally viewed and interpreted all ECGs.  52 bpm. Normal sinus rhythm.  Normal axis. Normal ST and T-wave ____________________________________________  RADIOLOGY All Xrays were viewed by me. Imaging interpreted by Radiologist.  Chest 2 view xray:  IMPRESSION: No active cardiopulmonary disease.  Stable chest.  [place] ruq: IMPRESSION: 1. No gallstones or sonographic evidence of acute cholecystitis. 2. Stable subcentimeter hyperechoic focus in the right hepatic lobe compatible with a hemangioma.  CT abdomen and pelvis with contrast:  No evidence of acute abnormalities. Abdominal aortic atherosclerosis. __________________________________________  PROCEDURES  Procedure(s) performed: None  Critical Care performed: None  ____________________________________________   ED COURSE / ASSESSMENT AND PLAN  Pertinent labs & imaging results that were available during my care of the patient were reviewed by me and considered in my medical decision making (see chart for details).   Ms. Burkle is here for right flank pain is worse with movement. My initial thought was this may be related to musculoskeletal, however with some nausea and abdominal discomfort, I am going to check an x-ray and an ultrasound.   Xray unremarkable and u/s without gallstones.  I spoke with this patient and she was still quite concerned about something else intra-abdominal going on. I discussed obtaining CT and they would like to proceed with this.  CT shows no acute findings.  I discussed with them that although no certain cause was found, examine evaluation are reassuring. I discussed the possibility of a post-shingles neuropathic pain, potentially persistent musculoskeletal or muscle spasm type pain, pinched nerve/bulging disc pain, or even potentially  GI/indigestion or other irritable bowel pains for which she can follow with her primary care physician and/or specialist as recommended by primary care.    CONSULTATIONS:   None   Patient / Family / Caregiver informed of clinical course, medical decision-making process, and agree with plan.   I discussed return precautions, follow-up instructions, and discharge instructions with patient and/or family.   ___________________________________________   FINAL CLINICAL IMPRESSION(S) /  ED DIAGNOSES   Final diagnoses:  RUQ pain  Right flank pain              Note: This dictation was prepared with Dragon dictation. Any transcriptional errors that result from this process are unintentional    Lisa Roca, MD 09/09/16 1825

## 2016-09-09 NOTE — Discharge Instructions (Signed)
You were evaluated for right-sided back pain wrapping around to the abdomen for several weeks now. As we discussed, your exam and evaluation are reassuring in the emergency department today. Additionally, we discussed several things that may not show up on emergency department testing including muscle strain, inflammation of musculoskeletal tissues or even pinched nerve, or possible post shingles nerve pain. In any case, I am recommending discharge home tonight, and follow up with your primary care physician.  Return to the emergency room for any worsening condition including fever, black or bloody stool, dizziness or passing out, worsening pain, or any other symptoms concerning to you.

## 2016-09-09 NOTE — ED Notes (Signed)
AAOx3.  Skin warm and dry.  Ambulates with easy and steady gait. NAD 

## 2016-09-09 NOTE — ED Triage Notes (Signed)
Pt with RUQ for one mth and radiates around to epigastric area with nausea.

## 2016-09-13 ENCOUNTER — Other Ambulatory Visit: Payer: Self-pay | Admitting: Adult Health

## 2016-09-13 DIAGNOSIS — R109 Unspecified abdominal pain: Secondary | ICD-10-CM

## 2016-09-21 ENCOUNTER — Ambulatory Visit: Payer: BC Managed Care – PPO

## 2016-09-21 ENCOUNTER — Other Ambulatory Visit: Payer: BC Managed Care – PPO

## 2016-09-28 ENCOUNTER — Other Ambulatory Visit: Payer: BC Managed Care – PPO

## 2016-09-28 ENCOUNTER — Ambulatory Visit: Payer: BC Managed Care – PPO

## 2016-09-29 ENCOUNTER — Encounter
Admission: RE | Admit: 2016-09-29 | Discharge: 2016-09-29 | Disposition: A | Discharge status: Home / Self Care | Payer: Medicare Other | Source: Ambulatory Visit | Attending: Adult Health | Admitting: Adult Health

## 2016-09-29 DIAGNOSIS — R109 Unspecified abdominal pain: Secondary | ICD-10-CM | POA: Insufficient documentation

## 2016-09-29 MED ORDER — TECHNETIUM TC 99M MEBROFENIN IV KIT
5.1200 | PACK | Freq: Once | INTRAVENOUS | Status: AC | PRN
  Administered 2016-09-29: 5.12 via INTRAVENOUS

## 2016-10-05 ENCOUNTER — Ambulatory Visit (INDEPENDENT_AMBULATORY_CARE_PROVIDER_SITE_OTHER): Payer: Medicare Other | Admitting: Diagnostic Neuroimaging

## 2016-10-05 ENCOUNTER — Ambulatory Visit: Payer: BC Managed Care – PPO

## 2016-10-05 ENCOUNTER — Other Ambulatory Visit: Payer: BC Managed Care – PPO

## 2016-10-05 ENCOUNTER — Encounter: Payer: Self-pay | Admitting: Diagnostic Neuroimaging

## 2016-10-05 VITALS — BP 138/68 | HR 54 | Ht 63.0 in | Wt 163.6 lb

## 2016-10-05 DIAGNOSIS — F430 Acute stress reaction: Secondary | ICD-10-CM | POA: Diagnosis not present

## 2016-10-05 DIAGNOSIS — G3184 Mild cognitive impairment, so stated: Secondary | ICD-10-CM

## 2016-10-05 DIAGNOSIS — R410 Disorientation, unspecified: Secondary | ICD-10-CM

## 2016-10-05 NOTE — Patient Instructions (Addendum)

## 2016-10-05 NOTE — Progress Notes (Signed)
[facility]  PATIENT: Abigail Shaw DOB: Apr 27, 1935  REFERRING CLINICIAN: Irish Shaw HISTORY FROM: patient and husband  REASON FOR VISIT: new consult    HISTORICAL  CHIEF COMPLAINT:  Chief Complaint  Patient presents with  . New Patient (Initial Visit)    Rm 6 [place] (husband). Paper referral from Abigail Abigail Shaw for memory.     HISTORY OF PRESENT ILLNESS:   81 year old right-handed female here for evaluation of memory problems and confusion.  November 2016 patient was at the grocery store with her husband, went into the bathroom, when she came out she suddenly became confused and didn't realize where she was. She had difficulty recognizing that she was in a grocery store or how she got there. She started to walk around the grocery store and finally realized where she was. She was able to find her husband who is in a different part of the store. The total episode lasted 5-15 minutes. Later that day she had no further problems.  Patient had another episode where she was at the drugstore and left her wallet in the store and left. Fortunately the store clerk was able to retrieve and deliver the wall to her when she realized that she had misplaced it.  Patient has had another episode where she had confusion about how to operate her coffee maker at home.  Patient has been under significant stress over the past 2 years. This includes multiple medical issues, hospitalizations, surgery and complications. Patient's husband thinks that his wife's medical stresses over the past 2 years have contributed to these episodes of forgetfulness and confusion.  Patient does have history of depression and anxiety, starting in her 60s when she had a "bad marriage" with her first husband and even had a suicide attempt. Patient is now happily married in her second marriage. Patient has been on Prozac for the past 15 years to help with management of depression anxiety. Patient feels that  her anxiety symptoms are slightly increased lately.  Patient saw a local neurologist who ordered MRI brain and carotid ultrasound which were unremarkable. Patient requested her PCP for a second opinion.   REVIEW OF SYSTEMS: Full 14 system review of systems performed and negative with exception of: Memory loss confusion snoring restless legs anemia joint pain aching muscles.  ALLERGIES: Allergies  Allergen Reactions  . Lisinopril Cough       . Simbrinza [Brinzolamide-Brimonidine]     lethargy  . Sulfamethoxazole-Trimethoprim     Other reaction(s): Other (See Comments) HYPONATREMIA    HOME MEDICATIONS: Outpatient Medications Prior to Visit  Medication Sig Dispense Refill  . alendronate (FOSAMAX) 70 MG tablet Take 70 mg by mouth every 7 (seven) days.     Marland Kitchen amLODipine (NORVASC) 2.5 MG tablet Take 2.5 mg by mouth daily.    Marland Kitchen aspirin 81 MG tablet Take 81 mg by mouth every evening.     . Biotin 1000 MCG tablet Take 1,000 mcg by mouth daily.    . cetirizine (ZYRTEC) 10 MG tablet Take 10 mg by mouth every evening.     . dorzolamide-timolol (COSOPT) 22.3-6.8 MG/ML ophthalmic solution Place 1 drop into both eyes 2 (two) times daily.     Marland Kitchen exemestane (AROMASIN) 25 MG tablet Take 1 tablet (25 mg total) by mouth daily. 90 tablet 0  . FLUoxetine (PROZAC) 20 MG capsule Take 20 mg by mouth daily.     . fluticasone (FLONASE) 50 MCG/ACT nasal spray Place 2 sprays into both nostrils daily.     Marland Kitchen  LORazepam (ATIVAN) 0.5 MG tablet Take 0.5 mg by mouth at bedtime.     Marland Kitchen losartan (COZAAR) 50 MG tablet Take 50 mg by mouth every evening.     . meloxicam (MOBIC) 15 MG tablet TAKE 1 TABLET (15 MG TOTAL) BY MOUTH DAILY. 30 tablet 5  . Olopatadine HCl (PATADAY) 0.2 % SOLN Place 1 drop into both eyes as needed.     . RABEprazole (ACIPHEX) 20 MG tablet TAKE 1 TABLET (20 MG TOTAL) BY MOUTH ONCE DAILY.    . rizatriptan (MAXALT) 10 MG tablet Take 10 mg by mouth as needed for migraine. May repeat in 2 hours if needed     . rosuvastatin (CRESTOR) 5 MG tablet Take 5 mg by mouth every other day.     . Travoprost, BAK Free, (TRAVATAN) 0.004 % SOLN ophthalmic solution Place 1 drop into both eyes at bedtime.    . Vitamin D, Ergocalciferol, (DRISDOL) 50000 UNITS CAPS capsule Take 1 capsule by mouth once a week.     No facility-administered medications prior to visit.     PAST MEDICAL HISTORY: Past Medical History:  Diagnosis Date  . Breast cyst   . Cancer ([place]) 2014   right breast cancer  . Depression   . GERD (gastroesophageal reflux disease)   . Glaucoma   . Headache    migraines  . Hypercholesteremia   . Hypertension   . IBS (irritable bowel syndrome)   . Joint pain   . Motion sickness    [place], cars  . Wears dentures    partial upper (1 tooth)    PAST SURGICAL HISTORY: Past Surgical History:  Procedure Laterality Date  . ABDOMINAL HYSTERECTOMY  1976  . BREAST MAMMOSITE Right 08-01-13  . BREAST SURGERY Right 07-02-2013   with sentinal biopsy  . BREAST SURGERY Right 07-18-13  . CATARACT EXTRACTION W/ INTRAOCULAR LENS  IMPLANT, BILATERAL    . COLONOSCOPY     Abigail Shaw  . FOOT SURGERY Left 12/05/14  . MM BREAST STEREO BX*R*R/S Right 2014  . PHOTOCOAGULATION WITH LASER Left 08/22/2016   Procedure: PHOTOCOAGULATION WITH LASER;  Surgeon: Abigail Freshwater, MD;  Location: [place];  Service: Ophthalmology;  Laterality: Left;  KEEP PT 2ND    FAMILY HISTORY: Family History  Problem Relation Age of Onset  . Cancer Mother     age 48 breast  . Cancer Brother     prostate/lung/ liver /spleen  . Breast cancer Maternal Aunt     age 82  . Colon cancer Paternal Aunt     SOCIAL HISTORY:  Social History   Social History  . Marital status: Married    Spouse name: N/A  . Number of children: 5  . Years of education: 12   Occupational History  . Retired    Social History Main Topics  . Smoking status: Never Smoker  . Smokeless tobacco: Never Used  . Alcohol use No  . Drug  use: No  . Sexual activity: Not on file   Other Topics Concern  . Not on file   Social History Narrative   Lives with husband   Caffeine use:  none     PHYSICAL EXAM  GENERAL EXAM/CONSTITUTIONAL: Vitals:  Vitals:   10/05/16 1111  BP: 138/68  Pulse: (!) 54  Weight: 163 lb 9.6 oz (74.2 kg)  Height: 5\' 3"  (1.6 m)     Body mass index is 28.98 kg/m.  No exam data present  Patient is in no distress;  well developed, nourished and groomed; neck is supple  CARDIOVASCULAR:  Examination of carotid arteries is normal; no carotid bruits  Regular rate and rhythm, no murmurs  Examination of peripheral vascular system by observation and palpation is normal  EYES:  Ophthalmoscopic exam of optic discs and posterior segments is normal; no papilledema or hemorrhages  MUSCULOSKELETAL:  Gait, strength, tone, movements noted in Neurologic exam below  NEUROLOGIC: MENTAL STATUS:  MMSE - Mini Mental State Exam 10/05/2016  Orientation to time 5  Orientation to Place 5  Registration 3  Attention/ Calculation 5  Recall 2  Language- name 2 objects 2  Language- repeat 1  Language- follow 3 step command 3  Language- read & follow direction 1  Write a sentence 1  Copy design 1  Total score 29    awake, alert, oriented to person, place and time  recent and remote memory intact  normal attention and concentration  language fluent, comprehension intact, naming intact,   fund of knowledge appropriate  CRANIAL NERVE:   2nd - no papilledema on fundoscopic exam  2nd, 3rd, 4th, 6th - pupils equal and reactive to light, visual fields full to confrontation, extraocular muscles intact, no nystagmus  5th - facial sensation symmetric  7th - facial strength symmetric  8th - hearing intact  9th - palate elevates symmetrically, uvula midline  11th - shoulder shrug symmetric  12th - tongue protrusion midline  MOTOR:   normal bulk and tone, full strength in the BUE,  BLE  SENSORY:   normal and symmetric to light touch, temperature, vibration  COORDINATION:   finger-nose-finger, fine finger movements normal  REFLEXES:   deep tendon reflexes present and symmetric  GAIT/STATION:   narrow based gait; able to tandem; romberg is negative    DIAGNOSTIC DATA (LABS, IMAGING, TESTING) - I reviewed patient records, labs, notes, testing and imaging myself where available.  Lab Results  Component Value Date   WBC 6.0 09/09/2016   HGB 11.2 (L) 09/09/2016   HCT 33.5 (L) 09/09/2016   MCV 76.9 (L) 09/09/2016   PLT 268 09/09/2016      Component Value Date/Time   NA 132 (L) 09/09/2016 1028   NA 133 (L) 12/09/2013 1024   K 4.0 09/09/2016 1028   K 4.0 12/09/2013 1024   CL 98 (L) 09/09/2016 1028   CL 98 12/09/2013 1024   CO2 28 09/09/2016 1028   CO2 26 12/09/2013 1024   GLUCOSE 97 09/09/2016 1028   GLUCOSE 113 (H) 12/09/2013 1024   BUN 14 09/09/2016 1028   BUN 15 12/09/2013 1024   CREATININE 0.74 09/09/2016 1028   CREATININE 0.83 12/09/2013 1024   CALCIUM 9.5 09/09/2016 1028   CALCIUM 9.3 12/09/2013 1024   PROT 6.9 09/09/2016 1028   PROT 7.7 12/09/2013 1024   ALBUMIN 3.9 09/09/2016 1028   ALBUMIN 3.6 12/09/2013 1024   AST 18 09/09/2016 1028   AST 26 12/09/2013 1024   ALT 18 09/09/2016 1028   ALT 42 12/09/2013 1024   ALKPHOS 79 09/09/2016 1028   ALKPHOS 91 12/09/2013 1024   BILITOT 0.6 09/09/2016 1028   BILITOT 0.2 12/09/2013 1024   GFRNONAA >60 09/09/2016 1028   GFRNONAA >60 12/09/2013 1024   GFRAA >60 09/09/2016 1028   GFRAA >60 12/09/2013 1024   No results found for: CHOL, HDL, LDLCALC, LDLDIRECT, TRIG, CHOLHDL No results found for: HGBA1C No results found for: VITAMINB12 No results found for: TSH  07/08/16 MRI brain [I reviewed images myself and agree with  interpretation. -VRP]  - Normal for age noncontrast MRI appearance of the brain.  06/29/16 carotid ultrasound - no focal carotid stenosis     ASSESSMENT AND PLAN  81  y.o. year old female here with several episodes of transient confusion, attention and focusing difficulties, with no change in her activities of daily living, normal neurologic examination, normal cognitive testing, normal MRI brain and normal carotid ultrasound. Would favor that this represents a type of stress reaction and decreased attention rather than underlying neurologic process.   Ddx: stress reaction (medical issues, surgeries, depression, anxiety), MCI, metabolic  1. Transient confusion   2. Impaired orientation   3. MCI (mild cognitive impairment)   4. Stress reaction      PLAN: I spent 45 minutes of face to face time with patient. Greater than 50% of time was spent in counseling and coordination of care with patient. In summary we discussed:  - monitor symptoms; if worsening then consider sleep study and neuropsych testing - prior testing, diagnosis and treatment options reviewed - brain healthy activities reviewed - consider therapist/counselor for anxiety/depression treatments  Return in about 6 months (around 04/05/2017).    Penni Bombard, MD [id number], [id number] PM Certified in Neurology, Neurophysiology and Neuroimaging  [facility] [address], [place], [place] [address] [phone]

## 2016-10-07 ENCOUNTER — Ambulatory Visit: Payer: BC Managed Care – PPO

## 2016-10-07 ENCOUNTER — Other Ambulatory Visit: Payer: BC Managed Care – PPO

## 2016-10-19 ENCOUNTER — Inpatient Hospital Stay: Payer: Medicare Other | Attending: Internal Medicine

## 2016-10-19 ENCOUNTER — Inpatient Hospital Stay (HOSPITAL_BASED_OUTPATIENT_CLINIC_OR_DEPARTMENT_OTHER): Payer: Medicare Other | Admitting: Internal Medicine

## 2016-10-19 ENCOUNTER — Other Ambulatory Visit: Payer: Self-pay

## 2016-10-19 VITALS — BP 120/78 | HR 56 | Temp 97.4°F | Ht 63.0 in | Wt 161.8 lb

## 2016-10-19 DIAGNOSIS — D509 Iron deficiency anemia, unspecified: Secondary | ICD-10-CM | POA: Insufficient documentation

## 2016-10-19 DIAGNOSIS — Z7982 Long term (current) use of aspirin: Secondary | ICD-10-CM | POA: Insufficient documentation

## 2016-10-19 DIAGNOSIS — K589 Irritable bowel syndrome without diarrhea: Secondary | ICD-10-CM

## 2016-10-19 DIAGNOSIS — Z8042 Family history of malignant neoplasm of prostate: Secondary | ICD-10-CM | POA: Insufficient documentation

## 2016-10-19 DIAGNOSIS — Z801 Family history of malignant neoplasm of trachea, bronchus and lung: Secondary | ICD-10-CM | POA: Diagnosis not present

## 2016-10-19 DIAGNOSIS — M810 Age-related osteoporosis without current pathological fracture: Secondary | ICD-10-CM | POA: Diagnosis not present

## 2016-10-19 DIAGNOSIS — Z923 Personal history of irradiation: Secondary | ICD-10-CM | POA: Diagnosis not present

## 2016-10-19 DIAGNOSIS — Z79899 Other long term (current) drug therapy: Secondary | ICD-10-CM | POA: Diagnosis not present

## 2016-10-19 DIAGNOSIS — Z79811 Long term (current) use of aromatase inhibitors: Secondary | ICD-10-CM

## 2016-10-19 DIAGNOSIS — E7800 Pure hypercholesterolemia, unspecified: Secondary | ICD-10-CM | POA: Diagnosis not present

## 2016-10-19 DIAGNOSIS — Z803 Family history of malignant neoplasm of breast: Secondary | ICD-10-CM | POA: Insufficient documentation

## 2016-10-19 DIAGNOSIS — K219 Gastro-esophageal reflux disease without esophagitis: Secondary | ICD-10-CM | POA: Insufficient documentation

## 2016-10-19 DIAGNOSIS — Z170 Estrogen receptor positive status [ER+]: Secondary | ICD-10-CM | POA: Insufficient documentation

## 2016-10-19 DIAGNOSIS — C50911 Malignant neoplasm of unspecified site of right female breast: Secondary | ICD-10-CM

## 2016-10-19 DIAGNOSIS — I10 Essential (primary) hypertension: Secondary | ICD-10-CM

## 2016-10-19 DIAGNOSIS — D649 Anemia, unspecified: Secondary | ICD-10-CM

## 2016-10-19 DIAGNOSIS — Z800 Family history of malignant neoplasm of digestive organs: Secondary | ICD-10-CM | POA: Diagnosis not present

## 2016-10-19 DIAGNOSIS — C50411 Malignant neoplasm of upper-outer quadrant of right female breast: Secondary | ICD-10-CM

## 2016-10-19 LAB — COMPREHENSIVE METABOLIC PANEL
ALBUMIN: 4.1 g/dL (ref 3.5–5.0)
ALT: 18 U/L (ref 14–54)
ANION GAP: 6 (ref 5–15)
AST: 26 U/L (ref 15–41)
Alkaline Phosphatase: 87 U/L (ref 38–126)
BILIRUBIN TOTAL: 0.3 mg/dL (ref 0.3–1.2)
BUN: 11 mg/dL (ref 6–20)
CO2: 26 mmol/L (ref 22–32)
Calcium: 9.9 mg/dL (ref 8.9–10.3)
Chloride: 104 mmol/L (ref 101–111)
Creatinine, Ser: 0.73 mg/dL (ref 0.44–1.00)
GFR calc non Af Amer: >60 mL/min (ref >60)
GLUCOSE: 99 mg/dL (ref 65–99)
POTASSIUM: 3.9 mmol/L (ref 3.5–5.1)
SODIUM: 136 mmol/L (ref 135–145)
TOTAL PROTEIN: 7.3 g/dL (ref 6.5–8.1)

## 2016-10-19 LAB — CBC WITH DIFFERENTIAL/PLATELET
BASOS PCT: 1 %
Basophils Absolute: 0 10*3/uL (ref 0–0.1)
EOS ABS: 0.1 10*3/uL (ref 0–0.7)
Eosinophils Relative: 2 %
HCT: 34.1 % — ABNORMAL LOW (ref 35.0–47.0)
Hemoglobin: 11.2 g/dL — ABNORMAL LOW (ref 12.0–16.0)
Lymphocytes Relative: 28 %
Lymphs Abs: 1.1 10*3/uL (ref 1.0–3.6)
MCH: 25.7 pg — AB (ref 26.0–34.0)
MCHC: 33 g/dL (ref 32.0–36.0)
MCV: 77.8 fL — ABNORMAL LOW (ref 80.0–100.0)
MONO ABS: 0.4 10*3/uL (ref 0.2–0.9)
MONOS PCT: 10 %
Neutro Abs: 2.2 10*3/uL (ref 1.4–6.5)
Neutrophils Relative %: 59 %
Platelets: 211 10*3/uL (ref 150–440)
RBC: 4.38 MIL/uL (ref 3.80–5.20)
RDW: 16.6 % — AB (ref 11.5–14.5)
WBC: 3.8 10*3/uL (ref 3.6–11.0)

## 2016-10-19 LAB — FERRITIN: Ferritin: 19 ng/mL (ref 11–307)

## 2016-10-19 LAB — IRON AND TIBC
IRON: 47 ug/dL (ref 28–170)
SATURATION RATIOS: 11 % (ref 10.4–31.8)
TIBC: 439 ug/dL (ref 250–450)
UIBC: 392 ug/dL

## 2016-10-19 LAB — VITAMIN B12: Vitamin B-12: 526 pg/mL (ref 180–914)

## 2016-10-19 NOTE — Progress Notes (Signed)
Patient here for breast cancer follow up. Reports no changes since last appointment.

## 2016-10-19 NOTE — Progress Notes (Signed)
[place]  [phone] [phone]) [phone]  ID: Abigail Shaw OB: 10-21-1935  MR#: [id number]  XAJ#:[id number]  Patient Care Team: Abigail Pink, MD as PCP - General (Family Medicine) Abigail [name], MD (General Surgery) Abigail Pink, MD as Referring Physician (Family Medicine)  CHIEF COMPLAINT: Breast Cancer    HPI:  Last seen by Abigail Alf, MD on  08/14/2015  1. 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 Tumor size 1.5 cm, grade 1, 2 sentinel nodes negative for metastasis. ER positive (80%), PR positive (90%).  HER-2/neu negative by FISH (Her2/CEP17 ratio 1.15). Oncotype DX test reportedly had a score of 18. Started adjuvant hormonal therapy with Anastrazole in Aug 2014 changed to exemestane January 2015.  2. History of mild osteoporosis -on Fosamax and calcium + vitamin D.   iNTERVAL HISTORY: No falls,no new breast lumps, no new bone pains. Has been noted top have IDA recnelty and is set up to see GI Denies heratburn, no melena, change in Bms, no nausea, vomiting, no weight loss. Copntinues aromasin no sidefefcts.   REVIEW OF SYSTEMS:   ROS  As per HPI. Otherwise, a complete review of systems is negative.  PAST MEDICAL HISTORY: Past Medical History:  Diagnosis Date  . Breast cyst   . Cancer ([place]) 2014   right breast cancer  . Depression   . GERD (gastroesophageal reflux disease)   . Glaucoma   . Headache    migraines  . Hypercholesteremia   . Hypertension   . IBS (irritable bowel syndrome)   . Joint pain   . Motion sickness    ferry, cars  . Wears dentures    partial upper (1 tooth)    PAST SURGICAL HISTORY: Past Surgical History:  Procedure Laterality Date  . ABDOMINAL HYSTERECTOMY  1976  . BREAST MAMMOSITE Right 08-01-13  . BREAST SURGERY Right 07-02-2013   with sentinal  biopsy  . BREAST SURGERY Right 07-18-13  . CATARACT EXTRACTION W/ INTRAOCULAR LENS  IMPLANT, BILATERAL    . COLONOSCOPY     Dr Abigail Shaw  . FOOT SURGERY Left 12/05/14  . MM BREAST STEREO BX*R*R/S Right 2014  . PHOTOCOAGULATION WITH LASER Left 08/22/2016   Procedure: PHOTOCOAGULATION WITH LASER;  Surgeon: Abigail Freshwater, MD;  Location: [facility];  Service: Ophthalmology;  Laterality: Left;  KEEP PT 2ND    FAMILY HISTORY: Family History  Problem Relation Age of Onset  . Cancer Mother     age 19 breast  . Cancer Brother     prostate/lung/ liver /spleen  . Breast cancer Maternal Aunt     age 8  . Colon cancer Paternal Aunt     ADVANCED DIRECTIVES (Y/N):  N  HEALTH MAINTENANCE: Social History  Substance Use Topics  . Smoking status: Never Smoker  . Smokeless tobacco: Never Used  . Alcohol use No     Colonoscopy:  PAP:  Bone density:  Lipid panel:  Allergies  Allergen Reactions  . Lisinopril Cough       . Simbrinza [Brinzolamide-Brimonidine]     lethargy  . Sulfamethoxazole-Trimethoprim     Other reaction(s): Other (See Comments) HYPONATREMIA    Current Outpatient Prescriptions  Medication Sig Dispense Refill  . amLODipine (NORVASC) 2.5 MG tablet Take 2.5 mg by mouth daily.    Marland Kitchen aspirin 81 MG tablet Take 81 mg by mouth every evening.     Marland Kitchen  atropine 1 % ophthalmic solution Apply to eye.    . Biotin 1000 MCG tablet Take 1,000 mcg by mouth daily.    . cetirizine (ZYRTEC) 10 MG tablet Take 10 mg by mouth every evening.     . Difluprednate 0.05 % EMUL Apply to eye.    . dorzolamide-timolol (COSOPT) 22.3-6.8 MG/ML ophthalmic solution Place 1 drop into both eyes 2 (two) times daily.     Marland Kitchen exemestane (AROMASIN) 25 MG tablet Take 1 tablet (25 mg total) by mouth daily. 90 tablet 0  . FLUoxetine (PROZAC) 20 MG capsule Take 20 mg by mouth daily.     . fluticasone (FLONASE) 50 MCG/ACT nasal spray Place 2 sprays into both nostrils daily.     Marland Kitchen LORazepam (ATIVAN)  0.5 MG tablet Take 0.5 mg by mouth at bedtime.     Marland Kitchen losartan (COZAAR) 50 MG tablet Take 50 mg by mouth every evening.     . Olopatadine HCl (PATADAY) 0.2 % SOLN Place 1 drop into both eyes as needed.     . RABEprazole (ACIPHEX) 20 MG tablet TAKE 1 TABLET (20 MG TOTAL) BY MOUTH ONCE DAILY.    . rizatriptan (MAXALT) 10 MG tablet Take 10 mg by mouth as needed for migraine. May repeat in 2 hours if needed    . rosuvastatin (CRESTOR) 5 MG tablet Take 5 mg by mouth every other day.     . Travoprost, BAK Free, (TRAVATAN) 0.004 % SOLN ophthalmic solution Place 1 drop into both eyes at bedtime.    . Vitamin D, Ergocalciferol, (DRISDOL) 50000 UNITS CAPS capsule Take 1 capsule by mouth once a week.     No current facility-administered medications for this visit.     OBJECTIVE: Vitals:   10/19/16 0951  BP: 120/78  Pulse: (!) 56  Temp: 97.4 F (36.3 C)     Body mass index is 28.66 kg/m.   1.81 meters squared  ECOG FS:0 - Asymptomatic  Physical Exam  Breasts: Left breast appears normal,  Right breast lumpectomy scar well healed, no suspicious masses, no skin or nipple changes or axillary nodes.   LAB RESULTS:  Lab Results  Component Value Date   NA 136 10/19/2016   K 3.9 10/19/2016   CL 104 10/19/2016   CO2 26 10/19/2016   GLUCOSE 99 10/19/2016   BUN 11 10/19/2016   CREATININE 0.73 10/19/2016   CALCIUM 9.9 10/19/2016   PROT 7.3 10/19/2016   ALBUMIN 4.1 10/19/2016   AST 26 10/19/2016   ALT 18 10/19/2016   ALKPHOS 87 10/19/2016   BILITOT 0.3 10/19/2016   GFRNONAA >60 10/19/2016   GFRAA >60 10/19/2016    Lab Results  Component Value Date   WBC 3.8 10/19/2016   NEUTROABS 2.2 10/19/2016   HGB 11.2 (L) 10/19/2016   HCT 34.1 (L) 10/19/2016   MCV 77.8 (L) 10/19/2016   PLT 211 10/19/2016     STUDIES: No results found.  ASSESSMENT:   No problem-specific Assessment & Plan notes found for this encounter.    PLAN:     IDA- HAS gI APPOTNMEN nOV 7- follow up on this STOP  ALENDRONATE- has been on it for many years and with IDA, she could have esophagitis, consider prolia if osteoproris perissts, order a DEXA SCAN -  Last mammo 06/2016- reviewed, benign. Next mammo ORDEED BY SURGEON FOR 06/2017  OV 3 MOTNHS CONTINUE AROMASIN   Patient expressed understanding and was in agreement with this plan. She also understands that She can call  clinic at any time with any questions, concerns, or complaints.  Orders Placed This Encounter  Procedures  . DG Bone Density    Standing Status:   Future    Standing Expiration Date:   10/19/2017    Order Specific Question:   Reason for Exam (SYMPTOM  OR DIAGNOSIS REQUIRED)    Answer:   h/o osteoporosis and on high risk med    Order Specific Question:   Preferred imaging location?    Answer:   [facility]    Return in about 6 months (around 04/19/2017). No matching staging information was found for the patient.   This note was generated in part with voice recognition software and I apologize for any typographical errors that were not detected and corrected.    Creola Corn, MD   11/04/2016 10:21 AM

## 2016-10-30 ENCOUNTER — Encounter: Payer: Self-pay | Admitting: Emergency Medicine

## 2016-10-30 ENCOUNTER — Emergency Department
Admission: EM | Admit: 2016-10-30 | Discharge: 2016-10-30 | Disposition: A | Discharge status: Home / Self Care | Payer: Medicare Other | Attending: Emergency Medicine | Admitting: Emergency Medicine

## 2016-10-30 DIAGNOSIS — Z79899 Other long term (current) drug therapy: Secondary | ICD-10-CM | POA: Diagnosis not present

## 2016-10-30 DIAGNOSIS — I10 Essential (primary) hypertension: Secondary | ICD-10-CM | POA: Diagnosis not present

## 2016-10-30 DIAGNOSIS — Z853 Personal history of malignant neoplasm of breast: Secondary | ICD-10-CM | POA: Diagnosis not present

## 2016-10-30 DIAGNOSIS — M79672 Pain in left foot: Secondary | ICD-10-CM

## 2016-10-30 DIAGNOSIS — M25511 Pain in right shoulder: Secondary | ICD-10-CM | POA: Insufficient documentation

## 2016-10-30 DIAGNOSIS — Z7982 Long term (current) use of aspirin: Secondary | ICD-10-CM | POA: Insufficient documentation

## 2016-10-30 NOTE — ED Provider Notes (Signed)
[facility] Emergency Department Provider Note  ____________________________________________  Time seen: Approximately 4:48 PM  I have reviewed the triage vital signs and the nursing notes.   HISTORY  Chief Complaint Fall and Shoulder Pain    HPI Abigail Shaw is a 81 y.o. female who requests referral to orthopedics and placement in [facility] [place] [facility].  She currently lives in [facility] [place] independent living. Last night she was in another part of the state when she had a fall, sustaining a right humerus fracture and left navicular bone fracture. At that time dictated reported that she was comfortable with the available assistance of her family, and the fractures seems suitable to outpatient follow-up according to their notes, so the patient was discharged home. However, they've now requested from [facility] [place] to be upgraded for the time being until her condition improves. They suggested she come to the ED today for doctor's orders.  She has a primary care doctor. She prefers [facility].     Past Medical History:  Diagnosis Date  . Breast cyst   . Cancer ([place]) 2014   right breast cancer  . Depression   . GERD (gastroesophageal reflux disease)   . Glaucoma   . Headache    migraines  . Hypercholesteremia   . Hypertension   . IBS (irritable bowel syndrome)   . Joint pain   . Motion sickness    fer[place], cars  . Wears dentures    partial upper (1 tooth)     Patient Active Problem List   Diagnosis Date Noted  . Absolute anemia 08/14/2015  . Arthritis 08/14/2015  . Clinical depression 08/14/2015  . Glaucoma 08/14/2015  . HLD (hyperlipidemia) 08/14/2015  . BP (high blood pressure) 08/14/2015  . Pain in shoulder 12/02/2014  . 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 Malignant neoplasm of upper-outer quadrant of female breast, T1,N0,M0. ER/PR pos, Her 2 neg 06/26/2013     Past Surgical History:  Procedure Laterality Date  .  ABDOMINAL HYSTERECTOMY  1976  . BREAST MAMMOSITE Right 08-01-13  . BREAST SURGERY Right 07-02-2013   with sentinal biopsy  . BREAST SURGERY Right 07-18-13  . CATARACT EXTRACTION W/ INTRAOCULAR LENS  IMPLANT, BILATERAL    . COLONOSCOPY     Dr Candace Cruise  . FOOT SURGERY Left 12/05/14  . MM BREAST STEREO BX*R*R/S Right 2014  . PHOTOCOAGULATION WITH LASER Left 08/22/2016   Procedure: PHOTOCOAGULATION WITH LASER;  Surgeon: Ronnell Freshwater, MD;  Location: [facility];  Service: Ophthalmology;  Laterality: Left;  KEEP PT 2ND     Prior to Admission medications   Medication Sig Start Date End Date Taking? Authorizing Provider  amLODipine (NORVASC) 2.5 MG tablet Take 2.5 mg by mouth daily.    Historical Provider, MD  aspirin 81 MG tablet Take 81 mg by mouth every evening.     Historical Provider, MD  atropine 1 % ophthalmic solution Apply to eye.    Historical Provider, MD  Biotin 1000 MCG tablet Take 1,000 mcg by mouth daily.    Historical Provider, MD  cetirizine (ZYRTEC) 10 MG tablet Take 10 mg by mouth every evening.     Historical Provider, MD  Difluprednate 0.05 % EMUL Apply to eye.    Historical Provider, MD  dorzolamide-timolol (COSOPT) 22.3-6.8 MG/ML ophthalmic solution Place 1 drop into both eyes 2 (two) times daily.  06/29/13   Historical Provider, MD  exemestane (AROMASIN) 25 MG tablet Take 1 tablet (25 mg total) by mouth daily.  06/08/16   Cammie Sickle, MD  FLUoxetine (PROZAC) 20 MG capsule Take 20 mg by mouth daily.     Historical Provider, MD  fluticasone (FLONASE) 50 MCG/ACT nasal spray Place 2 sprays into both nostrils daily.  07/15/13   Historical Provider, MD  LORazepam (ATIVAN) 0.5 MG tablet Take 0.5 mg by mouth at bedtime.     Historical Provider, MD  losartan (COZAAR) 50 MG tablet Take 50 mg by mouth every evening.     Historical Provider, MD  Olopatadine HCl (PATADAY) 0.2 % SOLN Place 1 drop into both eyes as needed.  06/12/14   Historical Provider, MD  RABEprazole  (ACIPHEX) 20 MG tablet TAKE 1 TABLET (20 MG TOTAL) BY MOUTH ONCE DAILY. 08/06/15   Historical Provider, MD  rizatriptan (MAXALT) 10 MG tablet Take 10 mg by mouth as needed for migraine. May repeat in 2 hours if needed    Historical Provider, MD  rosuvastatin (CRESTOR) 5 MG tablet Take 5 mg by mouth every other day.     Historical Provider, MD  Travoprost, BAK Free, (TRAVATAN) 0.004 % SOLN ophthalmic solution Place 1 drop into both eyes at bedtime.    Historical Provider, MD  Vitamin D, Ergocalciferol, (DRISDOL) 50000 UNITS CAPS capsule Take 1 capsule by mouth once a week. 06/14/14   Historical Provider, MD     Allergies Lisinopril; Simbrinza [brinzolamide-brimonidine]; and Sulfamethoxazole-trimethoprim   Family History  Problem Relation Age of Onset  . Cancer Mother     age 70 breast  . Cancer Brother     prostate/lung/ liver /spleen  . Breast cancer Maternal Aunt     age 23  . Colon cancer Paternal Aunt     Social History Social History  Substance Use Topics  . Smoking status: Never Smoker  . Smokeless tobacco: Never Used  . Alcohol use No    Review of Systems  Constitutional:   No fever or chills.  Cardiovascular:   No chest pain. Respiratory:   No dyspnea or cough.  Genitourinary:   Negative for dysuria or difficulty urinating. Musculoskeletal:   Right shoulder and left foot pain Neurological:   Negative for headaches 10-point ROS otherwise negative.  ____________________________________________   PHYSICAL EXAM:  VITAL SIGNS: ED Triage Vitals  Enc Vitals Group     BP 10/30/16 1435 (!) 168/63     Pulse Rate 10/30/16 1435 74     Resp 10/30/16 1435 18     Temp 10/30/16 1435 98.2 F (36.8 C)     Temp Source 10/30/16 1435 Oral     SpO2 10/30/16 1435 100 %     Weight 10/30/16 1439 160 lb (72.6 kg)     Height 10/30/16 1439 5' 2.75" (1.594 m)     Head Circumference --      Peak Flow --      Pain Score 10/30/16 1439 8     Pain Loc --      Pain Edu? --       Excl. in [place]? --     Vital signs reviewed, nursing assessments reviewed.   Constitutional:   Alert and oriented. Well appearing and in no distress. Eyes:   No scleral icterus. No conjunctival pallor. PERRL. EOMI.  No nystagmus. ENT   Head:   Normocephalic With ecchymosis under the left eye. No lacerations.   Nose:   No congestion/rhinnorhea. No septal hematoma. Small abrasion on the tip of the nose   Mouth/Throat:   MMM, no pharyngeal erythema. No peritonsillar mass.  Neck:   No stridor. No SubQ emphysema. No meningismus. Hematological/Lymphatic/Immunilogical:   No cervical lymphadenopathy. Cardiovascular:   RRR. Symmetric bilateral radial and DP pulses.  No murmurs.  Respiratory:   Normal respiratory effort without tachypnea nor retractions. Breath sounds are clear and equal bilaterally. No wheezes/rales/rhonchi. Gastrointestinal:   Soft and nontender. Non distended. There is no CVA tenderness.  No rebound, rigidity, or guarding. Genitourinary:   deferred Musculoskeletal:   Right arm in a sling. Pain with movement of the humerus. Compartments soft. Distal perfusion and sensation normal. Left leg in a well-padded splint, Ace wrapped in place. Good distal perfusion and motor function, Neurologic:   Normal speech and language.  CN 2-10 normal. Motor grossly intact. No gross focal neurologic deficits are appreciated.  Skin:    Skin is warm, dry and intact. No rash noted.  No petechiae, purpura, or bullae.  ____________________________________________    LABS (pertinent positives/negatives) (all labs ordered are listed, but only abnormal results are displayed) Labs Reviewed - No data to display ____________________________________________   EKG    ____________________________________________    RADIOLOGY  Radiology reports from yesterday  reviewed  ____________________________________________   PROCEDURES Procedures  ____________________________________________   INITIAL IMPRESSION / ASSESSMENT AND PLAN / ED COURSE  Pertinent labs & imaging results that were available during my care of the patient were reviewed by me and considered in my medical decision making (see chart for details).  Patient well appearing no acute distress. Is requesting coordination of care services from the emergency department. Clinical social work met with the family and patient to discuss their needs and figure out how we might help today. She is given follow-up contact information for her orthopedic clinic in [place] as well as her podiatrist Dr. Milinda Pointer who can follow up on the navicular bone fracture. Patient was advised that typically [facility] [place] can place her in a higher level of assistance over there just on request with a phone call from them. They have respite care. They'll call her primary care doctor tomorrow. Patient has nausea medicine and pain medicine as prescribed from the hospital yesterday. All questions and concerns were addressed to the patient and family's satisfaction. No acute complaints, no evidence of compartment syndrome or vascular injury or change in mental status.       Clinical Course    ____________________________________________   FINAL CLINICAL IMPRESSION(S) / ED DIAGNOSES  Final diagnoses:  Acute pain of right shoulder  Left foot pain       Portions of this note were generated with dragon dictation software. Dictation errors may occur despite best attempts at proofreading.    Carrie Mew, MD 10/30/16 [phone]

## 2016-10-30 NOTE — ED Triage Notes (Signed)
Pt fell last night tripped on a curb at [facility] last night in [place], [place]. Pt seen in ED there last night and diagnosed with right shoulder fracture. Pt has bruises noted to face. Pt with right knee and left foot pain as well. Pt was told she would need surgery on shoulder. Pt husband states that they would like to use [facility]. Pt is a resident of [facility]. Pt husband states nurse at [facility] recommended that she come here for evaluation. Pt brought records from hospital in [place] with her.

## 2016-10-30 NOTE — Clinical Social Work Note (Signed)
Clinical Social Work Assessment  Patient Details  Name: Abigail Shaw MRN: [id number] Date of Birth: 1935-07-31  Date of referral:  10/30/16               Reason for consult:  Discharge Planning                Permission sought to share information with:  Family Supports, Customer service manager Permission granted to share information::  Yes, Verbal Permission Granted  Name::     Abigail Shaw [phone]  Agency::  [facility]  Relationship::     Contact Information:  Husband Abigail Shaw [phone]  Housing/Transportation Living arrangements for the past 2 months:  [place], [place] of Information:  Patient, Spouse Patient Interpreter Needed:  None Criminal Activity/Legal Involvement Pertinent to Current Situation/Hospitalization:  No - Comment as needed Significant Relationships:  Adult Children Lives with:  Spouse Do you feel safe going back to the place where you live?  Yes Need for family participation in patient care:  Yes (Comment)  Care giving concerns:  Husband wishes to have [facility] do his wife's Corporate treasurer / plan:  LCSW introduced myself and spoke to family ( husband  Abigail Shaw) and Abigail Shaw daughter) They are independent residesents of [place] and are requesting their 3 day stay at [facility]. Left a detailed message for Abigail Shaw at [facility]. Patient is coveredBlue cross blue shield Medicare. LCSW collected information to have completed Fl2  Employment status:  Retired Forensic scientist:  Chief Operating Officer) PT Recommendations:  [place] / Referral to community resources:  [place]  Patient/Family's Response to care:  They want her placed pre-surgery care NOW  Patient/Family's Understanding of and Emotional Response to Diagnosis, Current Treatment, and Prognosis:  They were explained of the process and we are attempting to have them placed at  [facility]   Emotional Assessment Appearance:  Appears stated age Attitude/Demeanor/Rapport:   (Polite, calm and oriented x4) Affect (typically observed):  Appropriate, Adaptable, Accepting Orientation:  Oriented to Self, Oriented to Place, Oriented to  Time, Oriented to Situation Alcohol / Substance use:    Psych involvement (Current and /or in the community):  No (Comment)  Discharge Needs  Concerns to be addressed:  Discharge Planning Concerns, Care Coordination Readmission within the last 30 days:    Current discharge risk:  Dependent with Mobility Barriers to Discharge:  Continued Medical Work up   Abigail Reamer, LCSW 10/30/2016, 4:13 PM

## 2016-10-30 NOTE — NC FL2 (Signed)
[place] LEVEL OF CARE SCREENING TOOL     IDENTIFICATION  Patient Name: GENIECE CUPPETT Birthdate: [birth year] Sex: female Admission Date (Current Location): 10/30/2016  [place] and [place] Number:  Engineering geologist and Address:  [facility], [address], [place], [place] [address]      Provider Number: [id number]  Attending Physician Name and Address:  Carrie Mew, MD  Relative Name and Phone Number:       Current Level of Care: Hospital Recommended Level of Care: [place] Prior Approval Number:    Date Approved/Denied:   PASRR Number:  [id number]  Discharge Plan: SNF    Current Diagnoses: Patient Active Problem List   Diagnosis Date Noted  . Absolute anemia 08/14/2015  . Arthritis 08/14/2015  . Clinical depression 08/14/2015  . Glaucoma 08/14/2015  . HLD (hyperlipidemia) 08/14/2015  . BP (high blood pressure) 08/14/2015  . Pain in shoulder 12/02/2014  . 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 Malignant neoplasm of upper-outer quadrant of female breast, T1,N0,M0. ER/PR pos, Her 2 neg 06/26/2013    Orientation RESPIRATION BLADDER Height & Weight     Self, Time, Situation, Place  Normal Incontinent Weight: 160 lb (72.6 kg) Height:  5' 2.75" (159.4 cm)  BEHAVIORAL SYMPTOMS/MOOD NEUROLOGICAL BOWEL NUTRITION STATUS      Incontinent Diet (Normal)  AMBULATORY STATUS COMMUNICATION OF NEEDS Skin   Limited Assist Verbally Normal                       Personal Care Assistance Level of Assistance  Bathing, Feeding, Dressing, Total care Bathing Assistance: Limited assistance Feeding assistance: Limited assistance Dressing Assistance: Limited assistance Total Care Assistance: Limited assistance   Functional Limitations Info  Sight, Hearing, Speech Sight Info: Adequate Hearing Info: Adequate Speech Info: Adequate    SPECIAL CARE FACTORS FREQUENCY                        Contractures      Additional Factors Info                  Current Medications (10/30/2016):  This is the current hospital active medication list No current facility-administered medications for this encounter.    Current Outpatient Prescriptions  Medication Sig Dispense Refill  . amLODipine (NORVASC) 2.5 MG tablet Take 2.5 mg by mouth daily.    Marland Kitchen aspirin 81 MG tablet Take 81 mg by mouth every evening.     Marland Kitchen atropine 1 % ophthalmic solution Apply to eye.    . Biotin 1000 MCG tablet Take 1,000 mcg by mouth daily.    . cetirizine (ZYRTEC) 10 MG tablet Take 10 mg by mouth every evening.     . Difluprednate 0.05 % EMUL Apply to eye.    . dorzolamide-timolol (COSOPT) 22.3-6.8 MG/ML ophthalmic solution Place 1 drop into both eyes 2 (two) times daily.     Marland Kitchen exemestane (AROMASIN) 25 MG tablet Take 1 tablet (25 mg total) by mouth daily. 90 tablet 0  . FLUoxetine (PROZAC) 20 MG capsule Take 20 mg by mouth daily.     . fluticasone (FLONASE) 50 MCG/ACT nasal spray Place 2 sprays into both nostrils daily.     Marland Kitchen LORazepam (ATIVAN) 0.5 MG tablet Take 0.5 mg by mouth at bedtime.     Marland Kitchen losartan (COZAAR) 50 MG tablet Take 50 mg by mouth every evening.     . Olopatadine HCl (PATADAY) 0.2 % SOLN  Place 1 drop into both eyes as needed.     . RABEprazole (ACIPHEX) 20 MG tablet TAKE 1 TABLET (20 MG TOTAL) BY MOUTH ONCE DAILY.    . rizatriptan (MAXALT) 10 MG tablet Take 10 mg by mouth as needed for migraine. May repeat in 2 hours if needed    . rosuvastatin (CRESTOR) 5 MG tablet Take 5 mg by mouth every other day.     . Travoprost, BAK Free, (TRAVATAN) 0.004 % SOLN ophthalmic solution Place 1 drop into both eyes at bedtime.    . Vitamin D, Ergocalciferol, (DRISDOL) 50000 UNITS CAPS capsule Take 1 capsule by mouth once a week.       Discharge Medications: Please see discharge summary for a list of discharge medications.  Relevant Imaging Results:  Relevant Lab Results:   Additional  Information SSN [id number]  Joana Reamer, [place]

## 2016-10-30 NOTE — Progress Notes (Signed)
LCSW spoke with Family and [facility]- They are awaiting Ms Narmeen.s arrival. LCSW completed Fl2 and obtained a Passr Number with was forwarded to the family. They will follow up with [facility] for surgery and she will be in a SNF until her surgery takes place. Info was send through the [place] to [facility]  No further needs [employer] LCSW [phone]

## 2016-11-03 DIAGNOSIS — F332 Major depressive disorder, recurrent severe without psychotic features: Secondary | ICD-10-CM

## 2016-11-03 DIAGNOSIS — I10 Essential (primary) hypertension: Secondary | ICD-10-CM | POA: Diagnosis not present

## 2016-11-03 DIAGNOSIS — S92355A Nondisplaced fracture of fifth metatarsal bone, left foot, initial encounter for closed fracture: Secondary | ICD-10-CM | POA: Diagnosis not present

## 2016-11-03 DIAGNOSIS — C50919 Malignant neoplasm of unspecified site of unspecified female breast: Secondary | ICD-10-CM | POA: Diagnosis not present

## 2016-11-03 DIAGNOSIS — S42201A Unspecified fracture of upper end of right humerus, initial encounter for closed fracture: Secondary | ICD-10-CM | POA: Diagnosis not present

## 2016-11-30 ENCOUNTER — Ambulatory Visit: Payer: Medicare Other

## 2016-12-22 ENCOUNTER — Other Ambulatory Visit: Payer: Self-pay | Admitting: Internal Medicine

## 2016-12-22 DIAGNOSIS — C50411 Malignant neoplasm of upper-outer quadrant of right female breast: Secondary | ICD-10-CM

## 2016-12-29 DIAGNOSIS — M75111 Incomplete rotator cuff tear or rupture of right shoulder, not specified as traumatic: Secondary | ICD-10-CM | POA: Diagnosis not present

## 2016-12-29 DIAGNOSIS — S42221D 2-part displaced fracture of surgical neck of right humerus, subsequent encounter for fracture with routine healing: Secondary | ICD-10-CM | POA: Diagnosis not present

## 2017-01-02 DIAGNOSIS — M25511 Pain in right shoulder: Secondary | ICD-10-CM | POA: Diagnosis not present

## 2017-01-02 DIAGNOSIS — M6281 Muscle weakness (generalized): Secondary | ICD-10-CM | POA: Diagnosis not present

## 2017-01-04 DIAGNOSIS — M6281 Muscle weakness (generalized): Secondary | ICD-10-CM | POA: Diagnosis not present

## 2017-01-04 DIAGNOSIS — M25511 Pain in right shoulder: Secondary | ICD-10-CM | POA: Diagnosis not present

## 2017-01-06 DIAGNOSIS — M25511 Pain in right shoulder: Secondary | ICD-10-CM | POA: Diagnosis not present

## 2017-01-06 DIAGNOSIS — M6281 Muscle weakness (generalized): Secondary | ICD-10-CM | POA: Diagnosis not present

## 2017-01-09 DIAGNOSIS — M6281 Muscle weakness (generalized): Secondary | ICD-10-CM | POA: Diagnosis not present

## 2017-01-09 DIAGNOSIS — M25511 Pain in right shoulder: Secondary | ICD-10-CM | POA: Diagnosis not present

## 2017-01-10 DIAGNOSIS — M6281 Muscle weakness (generalized): Secondary | ICD-10-CM | POA: Diagnosis not present

## 2017-01-10 DIAGNOSIS — M25511 Pain in right shoulder: Secondary | ICD-10-CM | POA: Diagnosis not present

## 2017-01-13 DIAGNOSIS — M6281 Muscle weakness (generalized): Secondary | ICD-10-CM | POA: Diagnosis not present

## 2017-01-13 DIAGNOSIS — M25511 Pain in right shoulder: Secondary | ICD-10-CM | POA: Diagnosis not present

## 2017-01-16 DIAGNOSIS — M25511 Pain in right shoulder: Secondary | ICD-10-CM | POA: Diagnosis not present

## 2017-01-16 DIAGNOSIS — M6281 Muscle weakness (generalized): Secondary | ICD-10-CM | POA: Diagnosis not present

## 2017-01-17 DIAGNOSIS — M6281 Muscle weakness (generalized): Secondary | ICD-10-CM | POA: Diagnosis not present

## 2017-01-17 DIAGNOSIS — M25511 Pain in right shoulder: Secondary | ICD-10-CM | POA: Diagnosis not present

## 2017-01-19 DIAGNOSIS — M25511 Pain in right shoulder: Secondary | ICD-10-CM | POA: Diagnosis not present

## 2017-01-19 DIAGNOSIS — M6281 Muscle weakness (generalized): Secondary | ICD-10-CM | POA: Diagnosis not present

## 2017-01-19 DIAGNOSIS — H43813 Vitreous degeneration, bilateral: Secondary | ICD-10-CM | POA: Diagnosis not present

## 2017-01-23 DIAGNOSIS — M6281 Muscle weakness (generalized): Secondary | ICD-10-CM | POA: Diagnosis not present

## 2017-01-23 DIAGNOSIS — S42221D 2-part displaced fracture of surgical neck of right humerus, subsequent encounter for fracture with routine healing: Secondary | ICD-10-CM | POA: Diagnosis not present

## 2017-01-23 DIAGNOSIS — M25511 Pain in right shoulder: Secondary | ICD-10-CM | POA: Diagnosis not present

## 2017-01-24 DIAGNOSIS — M6281 Muscle weakness (generalized): Secondary | ICD-10-CM | POA: Diagnosis not present

## 2017-01-24 DIAGNOSIS — M25511 Pain in right shoulder: Secondary | ICD-10-CM | POA: Diagnosis not present

## 2017-01-26 DIAGNOSIS — M25511 Pain in right shoulder: Secondary | ICD-10-CM | POA: Diagnosis not present

## 2017-01-26 DIAGNOSIS — M6281 Muscle weakness (generalized): Secondary | ICD-10-CM | POA: Diagnosis not present

## 2017-01-30 DIAGNOSIS — M6281 Muscle weakness (generalized): Secondary | ICD-10-CM | POA: Diagnosis not present

## 2017-01-30 DIAGNOSIS — M25511 Pain in right shoulder: Secondary | ICD-10-CM | POA: Diagnosis not present

## 2017-02-01 DIAGNOSIS — M25511 Pain in right shoulder: Secondary | ICD-10-CM | POA: Diagnosis not present

## 2017-02-01 DIAGNOSIS — M6281 Muscle weakness (generalized): Secondary | ICD-10-CM | POA: Diagnosis not present

## 2017-02-03 DIAGNOSIS — M6281 Muscle weakness (generalized): Secondary | ICD-10-CM | POA: Diagnosis not present

## 2017-02-03 DIAGNOSIS — M25511 Pain in right shoulder: Secondary | ICD-10-CM | POA: Diagnosis not present

## 2017-02-06 DIAGNOSIS — M25511 Pain in right shoulder: Secondary | ICD-10-CM | POA: Diagnosis not present

## 2017-02-06 DIAGNOSIS — M6281 Muscle weakness (generalized): Secondary | ICD-10-CM | POA: Diagnosis not present

## 2017-02-08 DIAGNOSIS — M6281 Muscle weakness (generalized): Secondary | ICD-10-CM | POA: Diagnosis not present

## 2017-02-08 DIAGNOSIS — M25511 Pain in right shoulder: Secondary | ICD-10-CM | POA: Diagnosis not present

## 2017-02-10 DIAGNOSIS — M6281 Muscle weakness (generalized): Secondary | ICD-10-CM | POA: Diagnosis not present

## 2017-02-10 DIAGNOSIS — M25511 Pain in right shoulder: Secondary | ICD-10-CM | POA: Diagnosis not present

## 2017-02-13 DIAGNOSIS — M25511 Pain in right shoulder: Secondary | ICD-10-CM | POA: Diagnosis not present

## 2017-02-13 DIAGNOSIS — M6281 Muscle weakness (generalized): Secondary | ICD-10-CM | POA: Diagnosis not present

## 2017-02-13 DIAGNOSIS — S42221D 2-part displaced fracture of surgical neck of right humerus, subsequent encounter for fracture with routine healing: Secondary | ICD-10-CM | POA: Diagnosis not present

## 2017-02-15 DIAGNOSIS — M25511 Pain in right shoulder: Secondary | ICD-10-CM | POA: Diagnosis not present

## 2017-02-15 DIAGNOSIS — M6281 Muscle weakness (generalized): Secondary | ICD-10-CM | POA: Diagnosis not present

## 2017-02-17 DIAGNOSIS — M25511 Pain in right shoulder: Secondary | ICD-10-CM | POA: Diagnosis not present

## 2017-02-17 DIAGNOSIS — M6281 Muscle weakness (generalized): Secondary | ICD-10-CM | POA: Diagnosis not present

## 2017-02-21 DIAGNOSIS — M25511 Pain in right shoulder: Secondary | ICD-10-CM | POA: Diagnosis not present

## 2017-02-21 DIAGNOSIS — M6281 Muscle weakness (generalized): Secondary | ICD-10-CM | POA: Diagnosis not present

## 2017-02-22 DIAGNOSIS — M6281 Muscle weakness (generalized): Secondary | ICD-10-CM | POA: Diagnosis not present

## 2017-02-22 DIAGNOSIS — M25511 Pain in right shoulder: Secondary | ICD-10-CM | POA: Diagnosis not present

## 2017-02-24 DIAGNOSIS — M75111 Incomplete rotator cuff tear or rupture of right shoulder, not specified as traumatic: Secondary | ICD-10-CM | POA: Diagnosis not present

## 2017-02-24 DIAGNOSIS — M6281 Muscle weakness (generalized): Secondary | ICD-10-CM | POA: Diagnosis not present

## 2017-02-24 DIAGNOSIS — M25511 Pain in right shoulder: Secondary | ICD-10-CM | POA: Diagnosis not present

## 2017-02-27 DIAGNOSIS — M25511 Pain in right shoulder: Secondary | ICD-10-CM | POA: Diagnosis not present

## 2017-02-27 DIAGNOSIS — M6281 Muscle weakness (generalized): Secondary | ICD-10-CM | POA: Diagnosis not present

## 2017-02-27 DIAGNOSIS — M75111 Incomplete rotator cuff tear or rupture of right shoulder, not specified as traumatic: Secondary | ICD-10-CM | POA: Diagnosis not present

## 2017-02-28 DIAGNOSIS — H401491 Capsular glaucoma with pseudoexfoliation of lens, unspecified eye, mild stage: Secondary | ICD-10-CM | POA: Diagnosis not present

## 2017-03-01 DIAGNOSIS — M25511 Pain in right shoulder: Secondary | ICD-10-CM | POA: Diagnosis not present

## 2017-03-01 DIAGNOSIS — M6281 Muscle weakness (generalized): Secondary | ICD-10-CM | POA: Diagnosis not present

## 2017-03-01 DIAGNOSIS — M75111 Incomplete rotator cuff tear or rupture of right shoulder, not specified as traumatic: Secondary | ICD-10-CM | POA: Diagnosis not present

## 2017-03-03 DIAGNOSIS — M6281 Muscle weakness (generalized): Secondary | ICD-10-CM | POA: Diagnosis not present

## 2017-03-03 DIAGNOSIS — M25531 Pain in right wrist: Secondary | ICD-10-CM | POA: Diagnosis not present

## 2017-03-03 DIAGNOSIS — M25511 Pain in right shoulder: Secondary | ICD-10-CM | POA: Diagnosis not present

## 2017-03-03 DIAGNOSIS — M65321 Trigger finger, right index finger: Secondary | ICD-10-CM | POA: Diagnosis not present

## 2017-03-03 DIAGNOSIS — M75111 Incomplete rotator cuff tear or rupture of right shoulder, not specified as traumatic: Secondary | ICD-10-CM | POA: Diagnosis not present

## 2017-03-03 DIAGNOSIS — M24821 Other specific joint derangements of right elbow, not elsewhere classified: Secondary | ICD-10-CM | POA: Diagnosis not present

## 2017-03-06 DIAGNOSIS — M75111 Incomplete rotator cuff tear or rupture of right shoulder, not specified as traumatic: Secondary | ICD-10-CM | POA: Diagnosis not present

## 2017-03-06 DIAGNOSIS — M6281 Muscle weakness (generalized): Secondary | ICD-10-CM | POA: Diagnosis not present

## 2017-03-06 DIAGNOSIS — M25511 Pain in right shoulder: Secondary | ICD-10-CM | POA: Diagnosis not present

## 2017-03-08 DIAGNOSIS — M75111 Incomplete rotator cuff tear or rupture of right shoulder, not specified as traumatic: Secondary | ICD-10-CM | POA: Diagnosis not present

## 2017-03-08 DIAGNOSIS — M6281 Muscle weakness (generalized): Secondary | ICD-10-CM | POA: Diagnosis not present

## 2017-03-08 DIAGNOSIS — M25511 Pain in right shoulder: Secondary | ICD-10-CM | POA: Diagnosis not present

## 2017-03-09 ENCOUNTER — Ambulatory Visit
Admission: RE | Admit: 2017-03-09 | Discharge: 2017-03-09 | Disposition: A | Discharge status: Home / Self Care | Payer: Medicare Other | Source: Ambulatory Visit | Attending: Internal Medicine | Admitting: Internal Medicine

## 2017-03-09 DIAGNOSIS — M810 Age-related osteoporosis without current pathological fracture: Secondary | ICD-10-CM | POA: Diagnosis not present

## 2017-03-09 DIAGNOSIS — C50411 Malignant neoplasm of upper-outer quadrant of right female breast: Secondary | ICD-10-CM | POA: Diagnosis not present

## 2017-03-09 DIAGNOSIS — Z170 Estrogen receptor positive status [ER+]: Secondary | ICD-10-CM | POA: Diagnosis not present

## 2017-03-10 DIAGNOSIS — M6281 Muscle weakness (generalized): Secondary | ICD-10-CM | POA: Diagnosis not present

## 2017-03-10 DIAGNOSIS — M75111 Incomplete rotator cuff tear or rupture of right shoulder, not specified as traumatic: Secondary | ICD-10-CM | POA: Diagnosis not present

## 2017-03-10 DIAGNOSIS — M25511 Pain in right shoulder: Secondary | ICD-10-CM | POA: Diagnosis not present

## 2017-03-14 DIAGNOSIS — M75111 Incomplete rotator cuff tear or rupture of right shoulder, not specified as traumatic: Secondary | ICD-10-CM | POA: Diagnosis not present

## 2017-03-14 DIAGNOSIS — M25511 Pain in right shoulder: Secondary | ICD-10-CM | POA: Diagnosis not present

## 2017-03-14 DIAGNOSIS — M6281 Muscle weakness (generalized): Secondary | ICD-10-CM | POA: Diagnosis not present

## 2017-03-15 DIAGNOSIS — M25511 Pain in right shoulder: Secondary | ICD-10-CM | POA: Diagnosis not present

## 2017-03-15 DIAGNOSIS — M75111 Incomplete rotator cuff tear or rupture of right shoulder, not specified as traumatic: Secondary | ICD-10-CM | POA: Diagnosis not present

## 2017-03-15 DIAGNOSIS — M6281 Muscle weakness (generalized): Secondary | ICD-10-CM | POA: Diagnosis not present

## 2017-03-16 ENCOUNTER — Other Ambulatory Visit: Payer: Self-pay | Admitting: Hematology and Oncology

## 2017-03-16 DIAGNOSIS — C50411 Malignant neoplasm of upper-outer quadrant of right female breast: Secondary | ICD-10-CM

## 2017-03-17 DIAGNOSIS — M6281 Muscle weakness (generalized): Secondary | ICD-10-CM | POA: Diagnosis not present

## 2017-03-17 DIAGNOSIS — M75111 Incomplete rotator cuff tear or rupture of right shoulder, not specified as traumatic: Secondary | ICD-10-CM | POA: Diagnosis not present

## 2017-03-17 DIAGNOSIS — M25511 Pain in right shoulder: Secondary | ICD-10-CM | POA: Diagnosis not present

## 2017-03-20 ENCOUNTER — Inpatient Hospital Stay
Admit: 2017-03-20 | Discharge: 2017-03-20 | Disposition: A | Discharge status: Home / Self Care | Payer: MEDICARE | Attending: Emergency Medicine

## 2017-03-20 DIAGNOSIS — R197 Diarrhea, unspecified: Secondary | ICD-10-CM | POA: Diagnosis not present

## 2017-03-20 DIAGNOSIS — F419 Anxiety disorder, unspecified: Secondary | ICD-10-CM | POA: Diagnosis not present

## 2017-03-20 DIAGNOSIS — R1084 Generalized abdominal pain: Secondary | ICD-10-CM | POA: Diagnosis not present

## 2017-03-20 DIAGNOSIS — I10 Essential (primary) hypertension: Secondary | ICD-10-CM | POA: Diagnosis not present

## 2017-03-20 DIAGNOSIS — R109 Unspecified abdominal pain: Secondary | ICD-10-CM | POA: Diagnosis not present

## 2017-03-20 DIAGNOSIS — Z853 Personal history of malignant neoplasm of breast: Secondary | ICD-10-CM | POA: Diagnosis not present

## 2017-03-20 DIAGNOSIS — R112 Nausea with vomiting, unspecified: Secondary | ICD-10-CM | POA: Diagnosis not present

## 2017-03-20 DIAGNOSIS — F329 Major depressive disorder, single episode, unspecified: Secondary | ICD-10-CM | POA: Diagnosis not present

## 2017-03-20 DIAGNOSIS — E872 Acidosis: Secondary | ICD-10-CM | POA: Diagnosis not present

## 2017-03-20 DIAGNOSIS — Z882 Allergy status to sulfonamides status: Secondary | ICD-10-CM | POA: Diagnosis not present

## 2017-03-20 LAB — CBC WITH AUTOMATED DIFF
BASOPHILS: 0.6 % (ref 0–3)
EOSINOPHILS: 0.1 % (ref 0–5)
HCT: 39.6 % (ref 37.0–50.0)
HGB: 12.5 gm/dl — ABNORMAL LOW (ref 13.0–17.2)
IMMATURE GRANULOCYTES: 0.5 % (ref 0.0–3.0)
LYMPHOCYTES: 4.7 % — ABNORMAL LOW (ref 28–48)
MCH: 24.4 pg — ABNORMAL LOW (ref 25.4–34.6)
MCHC: 31.6 gm/dl (ref 30.0–36.0)
MCV: 77.2 fL — ABNORMAL LOW (ref 80.0–98.0)
MONOCYTES: 3.6 % (ref 1–13)
MPV: 10.2 fL — ABNORMAL HIGH (ref 6.0–10.0)
NEUTROPHILS: 90.5 % — ABNORMAL HIGH (ref 34–64)
NRBC: 0 (ref 0–0)
PLATELET: 297 10*3/uL (ref 140–450)
RBC: 5.13 M/uL (ref 3.60–5.20)
RDW-SD: 46.8 — ABNORMAL HIGH (ref 36.4–46.3)
WBC: 13.9 10*3/uL — ABNORMAL HIGH (ref 4.0–11.0)

## 2017-03-20 LAB — METABOLIC PANEL, COMPREHENSIVE
ALT (SGPT): 25 U/L (ref 12–78)
AST (SGOT): 12 U/L — ABNORMAL LOW (ref 15–37)
Albumin: 3.8 gm/dl (ref 3.4–5.0)
Alk. phosphatase: 110 U/L (ref 45–117)
BUN: 21 mg/dl (ref 7–25)
Bilirubin, total: 0.4 mg/dl (ref 0.2–1.0)
CO2: 24 mEq/L (ref 21–32)
Calcium: 9.3 mg/dl (ref 8.5–10.1)
Chloride: 102 mEq/L (ref 98–107)
Creatinine: 0.9 mg/dl (ref 0.6–1.3)
GFR est AA: >60
GFR est non-AA: >60
Glucose: 121 mg/dl — ABNORMAL HIGH (ref 74–106)
Potassium: 4.2 mEq/L (ref 3.5–5.1)
Protein, total: 7.5 gm/dl (ref 6.4–8.2)
Sodium: 135 mEq/L — ABNORMAL LOW (ref 136–145)

## 2017-03-20 LAB — LIPASE: Lipase: 364 U/L (ref 73–393)

## 2017-03-20 LAB — LACTIC ACID
Lactic Acid: 3.8 mmol/L — ABNORMAL HIGH (ref 0.4–2.0)
Lactic Acid: 2.8 mmol/L — ABNORMAL HIGH (ref 0.4–2.0)

## 2017-03-20 LAB — POC FECAL OCCULT BLOOD: Occult blood, stool: NEGATIVE — AB

## 2017-03-20 MED ORDER — ONDANSETRON (PF) 4 MG/2 ML INJECTION
4 mg/2 mL | Freq: Once | INTRAMUSCULAR | Status: AC
Start: 2017-03-20 — End: 2017-03-20
  Administered 2017-03-20: 07:00:00 via INTRAVENOUS

## 2017-03-20 MED ORDER — LOPERAMIDE 2 MG CAP
2 mg | ORAL | Status: AC
Start: 2017-03-20 — End: 2017-03-20
  Administered 2017-03-20: 11:00:00 via ORAL

## 2017-03-20 MED ORDER — LORAZEPAM 2 MG/ML IJ SOLN
2 mg/mL | Freq: Once | INTRAMUSCULAR | Status: AC
Start: 2017-03-20 — End: 2017-03-20
  Administered 2017-03-20: 07:00:00 via INTRAVENOUS

## 2017-03-20 MED ORDER — ACETAMINOPHEN 325 MG TABLET
325 mg | ORAL | Status: AC
Start: 2017-03-20 — End: 2017-03-20
  Administered 2017-03-20: 10:00:00 via ORAL

## 2017-03-20 MED ORDER — PROMETHAZINE 25 MG RECTAL SUPPOSITORY
25 mg | Freq: Four times a day (QID) | RECTAL | 0 refills | Status: AC | PRN
Start: 2017-03-20 — End: 2017-03-27

## 2017-03-20 MED ORDER — SODIUM CHLORIDE 0.9 % IJ SYRG
Freq: Once | INTRAMUSCULAR | Status: DC
Start: 2017-03-20 — End: 2017-03-20

## 2017-03-20 MED ORDER — ONDANSETRON 4 MG TAB, RAPID DISSOLVE
4 mg | ORAL_TABLET | Freq: Three times a day (TID) | ORAL | 0 refills | Status: AC | PRN
Start: 2017-03-20 — End: ?

## 2017-03-20 MED ORDER — SODIUM CHLORIDE 0.9% BOLUS IV
0.9 % | INTRAVENOUS | Status: AC
Start: 2017-03-20 — End: 2017-03-20
  Administered 2017-03-20: 07:00:00 via INTRAVENOUS

## 2017-03-20 MED FILL — LOPERAMIDE 2 MG CAP: 2 mg | ORAL | Qty: 2

## 2017-03-20 MED FILL — LORAZEPAM 2 MG/ML IJ SOLN: 2 mg/mL | INTRAMUSCULAR | Qty: 1

## 2017-03-20 MED FILL — ONDANSETRON (PF) 4 MG/2 ML INJECTION: 4 mg/2 mL | INTRAMUSCULAR | Qty: 2

## 2017-03-20 MED FILL — ACETAMINOPHEN 325 MG TABLET: 325 mg | ORAL | Qty: 2

## 2017-03-20 NOTE — ED Notes (Signed)
Pt discharged by Harlow OhmsPeyton, RN.    Pt taken to ED lobby via wheelchair.

## 2017-03-20 NOTE — ED Provider Notes (Addendum)
[facility]  Emergency Department Treatment Report    Patient: Leah Wong Age: 82 y.o. Sex: female    Date of Birth: July 03, 1935 Admit Date: 03/20/2017 PCP: Sol BlazingPhys Other, MD   MRN: [id number]  CSN: [id number]  Attending: Gwenyth Allegraimothy S Saara Kijowski, M.D.    Room: OTF/OTF Time Dictated: 1:51 AM APP:  Eliot Fordhristine Ward      Chief Complaint   Chief Complaint   Patient presents with   ??? Vomiting   ??? Diarrhea       History of Present Illness   82 y.o. female c/o acute vomiting onset 10 pm this evening and diarrhea onset midnight. Associated symptom include ABD cramping. Sick contacts confirmed in granddaughter and another party guest after leaving party. Known allergy to Bactrim. Hx of HTN. Currently taking Prednisone. No Hx of CHF or recent hospitalizations.       Review of Systems   Constitutional: No fever, chills, or weight loss  Eyes: No visual symptoms.  ENT: No sore throat, runny nose or ear pain.  Respiratory: No cough, dyspnea or wheezing.  Cardiovascular: No chest pain, pressure, palpitations, tightness or heaviness.  Gastrointestinal: Positive for vomiting and diarrhea. Positive for abdominal cramps. No hematemesis.   Genitourinary: No dysuria, frequency, or urgency. No melena.   Musculoskeletal: No joint pain or swelling.  Integumentary: No rashes.  Neurological: No headaches, sensory or motor symptoms.  Denies complaints in all other systems.    Past Medical/Surgical History     Past Medical History:   Diagnosis Date   ??? Anxiety    ??? Arm fracture     right    ??? Arthritis    ??? Cancer (HCC)     breast CA   ??? Depression    ??? Foot fracture, left    ??? Hypertension      Past Surgical History:   Procedure Laterality Date   ??? HX BREAST LUMPECTOMY      Right side       Social History     Social History     Social History   ??? Marital status: MARRIED     Spouse name: N/A   ??? Number of children: N/A   ??? Years of education: N/A     Social History Main Topics   ??? Smoking status: Never Smoker    ??? Smokeless tobacco: Never Used   ??? Alcohol use No   ??? Drug use: No   ??? Sexual activity: No     Other Topics Concern   ??? None     Social History Narrative   ??? None       Family History     Family History   Problem Relation Age of Onset   ??? Diabetes Mother    ??? Heart Disease Father    ??? Heart Disease Brother    ??? Diabetes Brother    ??? Cancer Brother        Home Medications     None       Allergies     Allergies   Allergen Reactions   ??? Bactrim [Sulfamethoxazole-Trimethoprim] Unknown (comments)      Bactrim    Physical Exam   ED Triage Vitals   Enc Vitals Group      BP 03/20/17 0136 172/97      Pulse (Heart Rate) 03/20/17 0136 72      Resp Rate 03/20/17 0136 20      Temp 03/20/17 0136 97.3 ??F (36.3 ??C)  Temp src --       O2 Sat (%) 03/20/17 0136 98 %      Weight 03/20/17 0136 161 lb      Height 03/20/17 0136 5\' 3"       Head Cir --       Peak Flow --       Pain Score --       Pain Loc --       Pain Edu? --       Excl. in GC? --      Constitutional: Patient appears well developed and well nourished. Marland Kitchen Appearance and behavior are age and situation appropriate.  HEENT: Conjunctiva clear.  PERRLA. Mucous membranes moist, non-erythematous.   Respiratory: lungs clear to auscultation, nonlabored respirations. No tachypnea or accessory muscle use.  Cardiovascular: heart regular rate and rhythm without murmur rubs or gallops.   Calves soft and non-tender. Distal pulses 2+ and equal bilaterally.  No peripheral edema or significant variscosities.    Gastrointestinal:  Abdomen soft, nontender without complaint of pain to palpation  Stool brown, quaiac negative.  Musculoskeletal: Nail beds pink with prompt capillary refill  Integumentary: warm and dry without rashes or lesions  Neurologic: alert and oriented, Sensation intact, motor strength equal and symmetric.  No facial asymmetry or dysarthria.    Impression and Management Plan   Patient is being evaluated today for the vomiting and diarrhea.  This  could be food related viral but also consider an obstruction and will obtain laboratory studies and consider imaging.  Diagnostic Studies   Lab:   Labs Reviewed   CBC WITH AUTOMATED DIFF - Abnormal; Notable for the following:        Result Value    WBC 13.9 (*)     HGB 12.5 (*)     MCV 77.2 (*)     MCH 24.4 (*)     MPV 10.2 (*)     RDW-SD 46.8 (*)     NEUTROPHILS 90.5 (*)     LYMPHOCYTES 4.7 (*)     All other components within normal limits   METABOLIC PANEL, COMPREHENSIVE - Abnormal; Notable for the following:     Sodium 135 (*)     Glucose 121 (*)     AST (SGOT) 12 (*)     All other components within normal limits   LACTIC ACID - Abnormal; Notable for the following:     Lactic Acid 3.8 (*)     All other components within normal limits   LACTIC ACID - Abnormal; Notable for the following:     Lactic Acid 2.8 (*)     All other components within normal limits   POC FECAL OCCULT BLOOD - Abnormal; Notable for the following:     Occult blood, stool negative (*)     All other components within normal limits   CULTURE, BLOOD   CULTURE, BLOOD   LIPASE         ED Course/Medical Decision Making   She was actively vomiting while she arrived to the ER and appeared acutely uncomfortable.  She was initially medicated with 4 mg of Zofran and I gave her 0.5 mg of Ativan which she states she takes on a regular basis.    Continuation by Dr. Marijo Sanes:  I interviewed and examined the patient. I discussed the patient with the mid-level provider, Eliot Ford, and I agree with the evaluation and plan as documented here.    Patient began with vomiting and estimates  20 episodes.  She said 5-6 episodes of diarrhea tonight as well.  These of been nonbloody.  Daughter and grandchild are also ill with GI symptoms after attending the same party.  Patient was given a liter of normal saline.  She was observed in the emergency department.  Differential diagnosis includes ischemic bowel,  food poisoning/viral or bacterial gastroenteritis, dehydration.  Bowel obstruction is considered but less likely in the face of diarrhea illness.  Lactic acid was elevated at 3.8.  When I examined the patient and her abdomen was soft and benign.  The majority of the first liter had infused at that time.  There's been no further vomiting nor is there been diarrhea.  Patient is requesting ice chips and fluids.  She has been able to tolerate sips of fluids and ice chips.  Lactic acid is improved at 2.8.  I examined the patient again.  Abdomen remains benign.  I think that the left acidosis is secondary to dehydration/hypovolemia.  The patient's is comfortable going home and continue oral hydration.  Just prior to discharge she had another diarrheal bowel movement.  His first when she had in multiple hours.  I have ordered 4 mg of Imodium.  I still feel the patient is suitable for outpatient management.    Medications   sodium chloride (NS) flush 5-10 mL (not administered)   sodium chloride (NS) flush 5-10 mL (not administered)   sodium chloride 0.9 % bolus infusion 1,000 mL (0 mL IntraVENous IV Completed 03/20/17 0624)   ondansetron (ZOFRAN) injection 4 mg (4 mg IntraVENous Given 03/20/17 0304)   LORazepam (ATIVAN) injection 0.5 mg (0.5 mg IntraVENous Given 03/20/17 0304)   acetaminophen (TYLENOL) tablet 650 mg (650 mg Oral Given 03/20/17 0547)   loperamide (IMODIUM) capsule 4 mg (4 mg Oral Given 03/20/17 4540)     Final Diagnosis       ICD-10-CM ICD-9-CM   1. Abdominal pain, generalized R10.84 789.07   2. Non-intractable vomiting with nausea, unspecified vomiting type R11.2 787.01   3. Diarrhea, unspecified type R19.7 787.91   4. Lactic acidosis E87.2 276.2       Disposition   Discharge to home.  Return to emergency department if worse in any way.  Continue fluids.  Imodium as needed for ongoing diarrhea.  Zofran ODT for nausea.  I also prescribed Phenergan suppositories if needed.  Return if worse in any way.     Scribe Attestation     [employer] acting as a Neurosurgeon for and in the presence of Smitty Cords, MD      March 20, 2017 at 1:51 AM       Provider Attestation:      I personally performed the services described in the documentation, reviewed the documentation, as recorded by the scribe in my presence, and it accurately and completely records my words and actions. March 20, 2017 at 1:51 AM - Dr. Delton See , MD      Discussed patient, findings, and treatments with Smitty Cords, MD prior to discharge and they are in agreement with treatment and discharge plan.    Caleen Jobs Ward, NP  March 20, 2017    My signature above authenticates this document and my orders, the final ??  diagnosis (es), discharge prescription (s), and instructions in the Epic ??  record.  If you have any questions please contact [phone].  ??  Nursing notes have been reviewed by the physician/ advanced practice ??  Clinician.  Dragon medical dictation software was used for portions of this report.  Unintended transcription errors may occur.

## 2017-03-20 NOTE — ED Notes (Signed)
Dr. Nelson at bedside with pt.

## 2017-03-20 NOTE — ED Notes (Signed)
Pt c/o headache & requesting tylenol.

## 2017-03-20 NOTE — ED Notes (Signed)
Pt states nausea improved & no vomiting/diarrhea since receiving meds.

## 2017-03-20 NOTE — ED Triage Notes (Signed)
N/V/D about 2100 tonight.  Patient states, " I've vomited about 20 times tonight."  The patient looks pale and is shivering.

## 2017-03-24 DIAGNOSIS — M6281 Muscle weakness (generalized): Secondary | ICD-10-CM | POA: Diagnosis not present

## 2017-03-24 DIAGNOSIS — M75111 Incomplete rotator cuff tear or rupture of right shoulder, not specified as traumatic: Secondary | ICD-10-CM | POA: Diagnosis not present

## 2017-03-24 DIAGNOSIS — M25511 Pain in right shoulder: Secondary | ICD-10-CM | POA: Diagnosis not present

## 2017-03-25 LAB — CULTURE, BLOOD
Blood Culture Result: NO GROWTH
Blood Culture Result: NO GROWTH

## 2017-03-28 DIAGNOSIS — M25511 Pain in right shoulder: Secondary | ICD-10-CM | POA: Diagnosis not present

## 2017-03-28 DIAGNOSIS — M6281 Muscle weakness (generalized): Secondary | ICD-10-CM | POA: Diagnosis not present

## 2017-03-29 ENCOUNTER — Encounter: Payer: Self-pay | Admitting: General Surgery

## 2017-03-29 ENCOUNTER — Ambulatory Visit (INDEPENDENT_AMBULATORY_CARE_PROVIDER_SITE_OTHER): Payer: Medicare Other | Admitting: General Surgery

## 2017-03-29 VITALS — BP 132/62 | HR 66 | Resp 12 | Ht 63.0 in | Wt 160.0 lb

## 2017-03-29 DIAGNOSIS — Z170 Estrogen receptor positive status [ER+]: Secondary | ICD-10-CM | POA: Diagnosis not present

## 2017-03-29 DIAGNOSIS — C50411 Malignant neoplasm of upper-outer quadrant of right female breast: Secondary | ICD-10-CM | POA: Diagnosis not present

## 2017-03-29 DIAGNOSIS — N644 Mastodynia: Secondary | ICD-10-CM | POA: Diagnosis not present

## 2017-03-29 NOTE — Progress Notes (Signed)
Patient ID: Abigail Shaw, female   DOB: 11-14-1935, 83 y.o.   MRN: [id number]  Chief Complaint  Patient presents with  . Other    HPI Abigail Shaw is a 82 y.o. female here today for a right breast check. Patient states she noticed some lumps in her right breast about 6 months now. She had a bad fell in November 2017. She was treated for right breast cancer in 2014 with lumpectomy/radiation and is on Aromasin HPI  Past Medical History:  Diagnosis Date  . Breast cyst   . Cancer ([place]) 2014   right breast cancer  . Depression   . GERD (gastroesophageal reflux disease)   . Glaucoma   . Headache    migraines  . Hypercholesteremia   . Hypertension   . IBS (irritable bowel syndrome)   . Joint pain   . Motion sickness    fer[place], cars  . Wears dentures    partial upper (1 tooth)    Past Surgical History:  Procedure Laterality Date  . ABDOMINAL HYSTERECTOMY  1976  . BREAST MAMMOSITE Right 08-01-13  . BREAST SURGERY Right 07-02-2013   with sentinal biopsy  . BREAST SURGERY Right 07-18-13  . CATARACT EXTRACTION W/ INTRAOCULAR LENS  IMPLANT, BILATERAL    . COLONOSCOPY     Dr Candace Cruise  . FOOT SURGERY Left 12/05/14  . MM BREAST STEREO BX*R*R/S Right 2014  . PHOTOCOAGULATION WITH LASER Left 08/22/2016   Procedure: PHOTOCOAGULATION WITH LASER;  Surgeon: Ronnell Freshwater, MD;  Location: [facility];  Service: Ophthalmology;  Laterality: Left;  KEEP PT 2ND    Family History  Problem Relation Age of Onset  . Cancer Mother     age 53 breast  . Cancer Brother     prostate/lung/ liver /spleen  . Breast cancer Maternal Aunt     age 71  . Colon cancer Paternal Aunt     Social History Social History  Substance Use Topics  . Smoking status: Never Smoker  . Smokeless tobacco: Never Used  . Alcohol use No    Allergies  Allergen Reactions  . Lisinopril Cough       . Simbrinza [Brinzolamide-Brimonidine]     lethargy  . Sulfamethoxazole-Trimethoprim     Other  reaction(s): Other (See Comments) HYPONATREMIA    Current Outpatient Prescriptions  Medication Sig Dispense Refill  . alendronate (FOSAMAX) 70 MG tablet TAKE 1 TABLET (70 MG TOTAL) BY MOUTH ONCE A WEEK. TAKE WITH A FULL GLASS OF WATER. DO NOT LIE DOWN FOR THE NEXT 30 MIN.    Marland Kitchen amLODipine (NORVASC) 2.5 MG tablet Take 2.5 mg by mouth daily.    Marland Kitchen aspirin 81 MG tablet Take 81 mg by mouth every evening.     Marland Kitchen atropine 1 % ophthalmic solution Apply to eye.    . Biotin 1000 MCG tablet Take 1,000 mcg by mouth daily.    . cetirizine (ZYRTEC) 10 MG tablet Take 10 mg by mouth every evening.     . Difluprednate 0.05 % EMUL Apply to eye.    . dorzolamide-timolol (COSOPT) 22.3-6.8 MG/ML ophthalmic solution Place 1 drop into both eyes 2 (two) times daily.     Marland Kitchen exemestane (AROMASIN) 25 MG tablet TAKE ONE TABLET BY MOUTH DAILY 90 tablet 0  . FLUoxetine (PROZAC) 20 MG capsule Take 20 mg by mouth daily.     . fluticasone (FLONASE) 50 MCG/ACT nasal spray Place 2 sprays into both nostrils daily.     Marland Kitchen LORazepam (  ATIVAN) 0.5 MG tablet Take 0.5 mg by mouth at bedtime.     Marland Kitchen losartan (COZAAR) 50 MG tablet Take 50 mg by mouth every evening.     . Olopatadine HCl (PATADAY) 0.2 % SOLN Place 1 drop into both eyes as needed.     . RABEprazole (ACIPHEX) 20 MG tablet TAKE 1 TABLET (20 MG TOTAL) BY MOUTH ONCE DAILY.    . rizatriptan (MAXALT) 10 MG tablet Take 10 mg by mouth as needed for migraine. May repeat in 2 hours if needed    . rosuvastatin (CRESTOR) 5 MG tablet Take 5 mg by mouth every other day.     . Travoprost, BAK Free, (TRAVATAN) 0.004 % SOLN ophthalmic solution Place 1 drop into both eyes at bedtime.    . Vitamin D, Ergocalciferol, (DRISDOL) 50000 UNITS CAPS capsule Take 1 capsule by mouth once a week.     No current facility-administered medications for this visit.     Review of Systems Review of Systems  Constitutional: Negative.   Respiratory: Negative.   Cardiovascular: Negative.     Blood  pressure 132/62, pulse 66, resp. rate 12, height 5\' 3"  (1.6 m), weight 160 lb (72.6 kg).  Physical Exam Physical Exam  Constitutional: She is oriented to person, place, and time. She appears well-developed and well-nourished.  Pulmonary/Chest: Right breast exhibits no inverted nipple, no mass, no nipple discharge, no skin change and no tenderness. Left breast exhibits no inverted nipple, no mass, no nipple discharge, no skin change and no tenderness.  Lymphadenopathy:    She has no axillary adenopathy.  Neurological: She is alert and oriented to person, place, and time.  Skin: Skin is warm and dry.    Data Reviewed None   Assessment Right breast pain , question lumps per pt.  My exam did not reveal any suggestion of lumps    Plan   Recommended imaging-mammogram and possibly [place] of right breast    Patient to have a right breast diagnotic mammogram .   This information has been scribed by Gaspar Cola CMA.  I have completed the exam and reviewed the above documentation for accuracy and completeness.  I agree with the above.  Haematologist has been used and any errors in dictation or transcription are unintentional.  Saren Corkern G. Jamal Collin, M.D., F.A.C.S.   Junie Panning G 04/03/2017, 2:16 PM

## 2017-03-29 NOTE — Patient Instructions (Addendum)
Patient to have a right breast diagnotic mammogram .

## 2017-03-30 DIAGNOSIS — M25511 Pain in right shoulder: Secondary | ICD-10-CM | POA: Diagnosis not present

## 2017-03-30 DIAGNOSIS — M6281 Muscle weakness (generalized): Secondary | ICD-10-CM | POA: Diagnosis not present

## 2017-04-04 DIAGNOSIS — M6281 Muscle weakness (generalized): Secondary | ICD-10-CM | POA: Diagnosis not present

## 2017-04-04 DIAGNOSIS — M25511 Pain in right shoulder: Secondary | ICD-10-CM | POA: Diagnosis not present

## 2017-04-05 ENCOUNTER — Ambulatory Visit: Payer: BC Managed Care – PPO | Admitting: Diagnostic Neuroimaging

## 2017-04-06 DIAGNOSIS — S42221D 2-part displaced fracture of surgical neck of right humerus, subsequent encounter for fracture with routine healing: Secondary | ICD-10-CM | POA: Diagnosis not present

## 2017-04-06 DIAGNOSIS — M25511 Pain in right shoulder: Secondary | ICD-10-CM | POA: Diagnosis not present

## 2017-04-06 DIAGNOSIS — M6281 Muscle weakness (generalized): Secondary | ICD-10-CM | POA: Diagnosis not present

## 2017-04-06 DIAGNOSIS — M75111 Incomplete rotator cuff tear or rupture of right shoulder, not specified as traumatic: Secondary | ICD-10-CM | POA: Diagnosis not present

## 2017-04-06 DIAGNOSIS — M25531 Pain in right wrist: Secondary | ICD-10-CM | POA: Diagnosis not present

## 2017-04-11 DIAGNOSIS — M6281 Muscle weakness (generalized): Secondary | ICD-10-CM | POA: Diagnosis not present

## 2017-04-11 DIAGNOSIS — M25511 Pain in right shoulder: Secondary | ICD-10-CM | POA: Diagnosis not present

## 2017-04-13 DIAGNOSIS — M6281 Muscle weakness (generalized): Secondary | ICD-10-CM | POA: Diagnosis not present

## 2017-04-13 DIAGNOSIS — M25511 Pain in right shoulder: Secondary | ICD-10-CM | POA: Diagnosis not present

## 2017-04-14 ENCOUNTER — Ambulatory Visit
Admission: RE | Admit: 2017-04-14 | Discharge: 2017-04-14 | Disposition: A | Discharge status: Home / Self Care | Payer: Medicare Other | Source: Ambulatory Visit | Attending: General Surgery | Admitting: General Surgery

## 2017-04-14 DIAGNOSIS — Z170 Estrogen receptor positive status [ER+]: Secondary | ICD-10-CM | POA: Diagnosis not present

## 2017-04-14 DIAGNOSIS — N644 Mastodynia: Secondary | ICD-10-CM

## 2017-04-14 DIAGNOSIS — C50411 Malignant neoplasm of upper-outer quadrant of right female breast: Secondary | ICD-10-CM | POA: Diagnosis not present

## 2017-04-17 ENCOUNTER — Encounter: Payer: Self-pay | Admitting: Podiatry

## 2017-04-17 ENCOUNTER — Ambulatory Visit (INDEPENDENT_AMBULATORY_CARE_PROVIDER_SITE_OTHER): Payer: Medicare Other | Admitting: Podiatry

## 2017-04-17 DIAGNOSIS — L600 Ingrowing nail: Secondary | ICD-10-CM

## 2017-04-17 NOTE — Progress Notes (Signed)
   Subjective:    Patient ID: Abigail Shaw, female    DOB: 1935/01/10, 82 y.o.   MRN: [id number]  HPI  82 year old female presents the office today for concerns of ingrown toenails of left big toe which is been ongoing for several years and is painful to walk due to pressure. She states is worsened over the last couple months. She denies any drainage or pus that she has noticed some swelling on the toenail. Also like me to look at the second toes in both of her feet as the ostomy ingrown but they're not painful at this time. She is no other complaints.  Review of Systems  All other systems reviewed and are negative.      Objective:   Physical Exam General: AAO x3, NAD  Dermatological: Incurvation is present along the lateral nail border of the left big toe. There is localized edema without any erythema or increase in warmth around the nail border. There is no drainage or pus. There is no ascending cellulitis. No malodor. No open lesions or pre-ulcerative lesions. Also mild incurvation on bilateral second toes along the medial nail border. No drainage or pus or any swelling.   Vascular: Dorsalis Pedis artery and Posterior Tibial artery pedal pulses are 2/4 bilateral with immedate capillary fill time. Pedal hair growth present.  There is no pain with calf compression, swelling, warmth, erythema.   Neruologic: Grossly intact via light touch bilateral. Vibratory intact via tuning fork bilateral. Protective threshold with Semmes Wienstein monofilament intact to all pedal sites bilateral.   Musculoskeletal: No gross boney pedal deformities bilateral. No pain, crepitus, or limitation noted with foot and ankle range of motion bilateral. Muscular strength 5/5 in all groups tested bilateral.  Gait: Unassisted, Nonantalgic.      Assessment & Plan:  82 year old female left lateral symptomatic ingrown toenail of the hallux and bilateral second digit asymptomatic ingrown toenail -Treatment options  discussed including all alternatives, risks, and complications -Etiology of symptoms were discussed -At this time, the patient is requesting partial nail removal with chemical matricectomy to the symptomatic portion of the nail. Risks and complications were discussed with the patient for which they understand and  verbally consent to the procedure. Under sterile conditions a total of 3 mL of a mixture of 2% lidocaine plain and 0.5% Marcaine plain was infiltrated in a hallux block fashion. Once anesthetized, the skin was prepped in sterile fashion. A tourniquet was then applied. Next the lateral aspect of hallux nail border was then sharply excised making sure to remove the entire offending nail border. Once the nails were ensured to be removed area was debrided and the underlying skin was intact. There is no purulence identified in the procedure. Next phenol was then applied under standard conditions and copiously irrigated. Silvadene was applied. A dry sterile dressing was applied. After application of the dressing the tourniquet was removed and there is found to be an immediate capillary refill time to the digit. The patient tolerated the procedure well any complications. Post procedure instructions were discussed the patient for which he verbally understood. Follow-up in one week for nail check or sooner if any problems are to arise. Discussed signs/symptoms of infection and directed to call the office immediately should any occur or go directly to the emergency room. In the meantime, encouraged to call the office with any questions, concerns, changes symptoms. -Debrided bilateral 2nd digit toenails to remove the ingrowing portion.   Celesta Gentile, DPM

## 2017-04-17 NOTE — Patient Instructions (Signed)

## 2017-04-18 ENCOUNTER — Encounter: Payer: Self-pay | Admitting: Oncology

## 2017-04-18 ENCOUNTER — Telehealth: Payer: Self-pay

## 2017-04-18 ENCOUNTER — Inpatient Hospital Stay: Payer: Medicare Other | Attending: Oncology | Admitting: Oncology

## 2017-04-18 ENCOUNTER — Inpatient Hospital Stay: Payer: Medicare Other

## 2017-04-18 VITALS — BP 123/73 | HR 65 | Temp 98.3°F | Resp 18 | Wt 161.8 lb

## 2017-04-18 DIAGNOSIS — Z800 Family history of malignant neoplasm of digestive organs: Secondary | ICD-10-CM | POA: Diagnosis not present

## 2017-04-18 DIAGNOSIS — D509 Iron deficiency anemia, unspecified: Secondary | ICD-10-CM | POA: Insufficient documentation

## 2017-04-18 DIAGNOSIS — C50411 Malignant neoplasm of upper-outer quadrant of right female breast: Secondary | ICD-10-CM | POA: Diagnosis not present

## 2017-04-18 DIAGNOSIS — Z780 Asymptomatic menopausal state: Secondary | ICD-10-CM | POA: Diagnosis not present

## 2017-04-18 DIAGNOSIS — Z809 Family history of malignant neoplasm, unspecified: Secondary | ICD-10-CM | POA: Insufficient documentation

## 2017-04-18 DIAGNOSIS — Z79811 Long term (current) use of aromatase inhibitors: Secondary | ICD-10-CM | POA: Diagnosis not present

## 2017-04-18 DIAGNOSIS — K219 Gastro-esophageal reflux disease without esophagitis: Secondary | ICD-10-CM | POA: Diagnosis not present

## 2017-04-18 DIAGNOSIS — D649 Anemia, unspecified: Secondary | ICD-10-CM

## 2017-04-18 DIAGNOSIS — Z170 Estrogen receptor positive status [ER+]: Principal | ICD-10-CM

## 2017-04-18 DIAGNOSIS — Z8042 Family history of malignant neoplasm of prostate: Secondary | ICD-10-CM | POA: Insufficient documentation

## 2017-04-18 DIAGNOSIS — H409 Unspecified glaucoma: Secondary | ICD-10-CM | POA: Diagnosis not present

## 2017-04-18 DIAGNOSIS — K589 Irritable bowel syndrome without diarrhea: Secondary | ICD-10-CM | POA: Insufficient documentation

## 2017-04-18 DIAGNOSIS — Z882 Allergy status to sulfonamides status: Secondary | ICD-10-CM | POA: Diagnosis not present

## 2017-04-18 DIAGNOSIS — Z808 Family history of malignant neoplasm of other organs or systems: Secondary | ICD-10-CM | POA: Insufficient documentation

## 2017-04-18 DIAGNOSIS — Z7982 Long term (current) use of aspirin: Secondary | ICD-10-CM | POA: Insufficient documentation

## 2017-04-18 DIAGNOSIS — Z79899 Other long term (current) drug therapy: Secondary | ICD-10-CM | POA: Insufficient documentation

## 2017-04-18 DIAGNOSIS — M810 Age-related osteoporosis without current pathological fracture: Secondary | ICD-10-CM

## 2017-04-18 DIAGNOSIS — E7800 Pure hypercholesterolemia, unspecified: Secondary | ICD-10-CM | POA: Diagnosis not present

## 2017-04-18 DIAGNOSIS — I10 Essential (primary) hypertension: Secondary | ICD-10-CM

## 2017-04-18 DIAGNOSIS — Z9181 History of falling: Secondary | ICD-10-CM | POA: Diagnosis not present

## 2017-04-18 DIAGNOSIS — M25511 Pain in right shoulder: Secondary | ICD-10-CM | POA: Diagnosis not present

## 2017-04-18 DIAGNOSIS — Z801 Family history of malignant neoplasm of trachea, bronchus and lung: Secondary | ICD-10-CM | POA: Diagnosis not present

## 2017-04-18 DIAGNOSIS — M6281 Muscle weakness (generalized): Secondary | ICD-10-CM | POA: Diagnosis not present

## 2017-04-18 LAB — CBC
HCT: 33.8 % — ABNORMAL LOW (ref 35.0–47.0)
Hemoglobin: 11.1 g/dL — ABNORMAL LOW (ref 12.0–16.0)
MCH: 24.3 pg — AB (ref 26.0–34.0)
MCHC: 32.8 g/dL (ref 32.0–36.0)
MCV: 74.3 fL — ABNORMAL LOW (ref 80.0–100.0)
PLATELETS: 252 10*3/uL (ref 150–440)
RBC: 4.55 MIL/uL (ref 3.80–5.20)
RDW: 17.6 % — AB (ref 11.5–14.5)
WBC: 5.1 10*3/uL (ref 3.6–11.0)

## 2017-04-18 LAB — FERRITIN: FERRITIN: 13 ng/mL (ref 11–307)

## 2017-04-18 LAB — IRON AND TIBC
IRON: 52 ug/dL (ref 28–170)
Saturation Ratios: 11 % (ref 10.4–31.8)
TIBC: 483 ug/dL — ABNORMAL HIGH (ref 250–450)
UIBC: 431 ug/dL

## 2017-04-18 NOTE — Telephone Encounter (Signed)
Gerald Stabs gave me the phone and I spoke to Abigail Shaw to let her know that her ferritin was low but hgb just barely low so Dr. Janese Banks wanted Abigail Shaw to start otc iron pills 325 mg and take one daily with food and if she tolerates it she can inc. To twice a day.  I let her know that it can cause constipation and stomach upset so that is why she should take with food.  She states she has taken iron pills in the past. She will go and start it again

## 2017-04-18 NOTE — Progress Notes (Signed)
Hematology/Oncology Consult note [facility]  Telephone:([phone] [phone]  Patient Care Team: [name], MD as PCP - General (Family Medicine) Seeplaputhur [name], MD (General Surgery) [name], MD as Referring Physician (Family Medicine)   Name of the patient: Abigail Shaw  [id number]  April 16, 1935   Date of visit: 04/18/17  Diagnosis- Stage 1 breast cancer on aromasin  Chief complaint/ Reason for visit- routine f/u  Heme/Onc history: 1. 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 Tumor size 1.5 cm, grade 1, 2 sentinel nodes negative for metastasis. ER positive (80%), PR positive (90%). HER-2/neu negative by FISH (Her2/CEP17 ratio 1.15). Oncotype DX test reportedly had a score of 18. Started adjuvant hormonal therapy with Anastrazole in Aug 2014 changed to exemestane January 2015.  2. History of mild osteoporosis -on Fosamax and calcium + vitamin D.   Interval history- patient is tolerating her Aromasin well without any significant side effects. She is also on calcium and vitamin D supplements. She continues to take Fosamax as well. Patient recently had a fall in November 2017 and complained of right breast tenderness for which she had a mammogram and ultrasound which did not reveal any evidence of malignancy. She continues to follow up with Dr. Jamal Collin. She is otherwise doing well and reports no change in her appetite. Denies any unintentional weight loss or aches or pains anywhere  ECOG PS- 1 Pain scale- 0  Review of systems- Review of Systems  Constitutional: Negative for chills, fever, malaise/fatigue and weight loss.  HENT: Negative for congestion, ear discharge and nosebleeds.   Eyes: Negative for blurred vision.  Respiratory: Negative for cough, hemoptysis, sputum production, shortness of  breath and wheezing.   Cardiovascular: Negative for chest pain, palpitations, orthopnea and claudication.  Gastrointestinal: Negative for abdominal pain, blood in stool, constipation, diarrhea, heartburn, melena, nausea and vomiting.  Genitourinary: Negative for dysuria, flank pain, frequency, hematuria and urgency.  Musculoskeletal: Negative for back pain, joint pain and myalgias.  Skin: Negative for rash.  Neurological: Negative for dizziness, tingling, focal weakness, seizures, weakness and headaches.  Endo/Heme/Allergies: Does not bruise/bleed easily.  Psychiatric/Behavioral: Negative for depression and suicidal ideas. The patient does not have insomnia.      Current treatment- observation  Allergies  Allergen Reactions  . Lisinopril Cough       . Simbrinza [Brinzolamide-Brimonidine]     lethargy  . Sulfamethoxazole-Trimethoprim     Other reaction(s): Other (See Comments) HYPONATREMIA     Past Medical History:  Diagnosis Date  . Breast cyst   . Cancer ([place]) 2014   right breast cancer  . Depression   . GERD (gastroesophageal reflux disease)   . Glaucoma   . Headache    migraines  . Hypercholesteremia   . Hypertension   . IBS (irritable bowel syndrome)   . Joint pain   . Motion sickness    fer[place], cars  . Wears dentures    partial upper (1 tooth)     Past Surgical History:  Procedure Laterality Date  . ABDOMINAL HYSTERECTOMY  1976  . BREAST LUMPECTOMY Right 2014   + mammo cite rad and aromasin  . BREAST MAMMOSITE Right 08-01-13  . BREAST SURGERY Right 07-02-2013   with sentinal biopsy  . BREAST SURGERY Right 07-18-13  . CATARACT EXTRACTION W/ INTRAOCULAR LENS  IMPLANT, BILATERAL    . COLONOSCOPY  Dr Candace Cruise  . FOOT SURGERY Left 12/05/14  . MM BREAST STEREO BX*R*R/S Right 2014  . PHOTOCOAGULATION WITH LASER Left 08/22/2016   Procedure: PHOTOCOAGULATION WITH LASER;  Surgeon: Ronnell Freshwater, MD;  Location: [place];  Service: Ophthalmology;   Laterality: Left;  KEEP PT 2ND    Social History   Social History  . Marital status: Married    Spouse name: N/A  . Number of children: 5  . Years of education: 12   Occupational History  . Retired    Social History Main Topics  . Smoking status: Never Smoker  . Smokeless tobacco: Never Used  . Alcohol use No  . Drug use: No  . Sexual activity: Not on file   Other Topics Concern  . Not on file   Social History Narrative   Lives with husband   Caffeine use:  none    Family History  Problem Relation Age of Onset  . Cancer Mother     age 53 breast  . Cancer Brother     prostate/lung/ liver /spleen  . Breast cancer Maternal Aunt     age 28  . Colon cancer Paternal Aunt      Current Outpatient Prescriptions:  .  alendronate (FOSAMAX) 70 MG tablet, TAKE 1 TABLET (70 MG TOTAL) BY MOUTH ONCE A WEEK. TAKE WITH A FULL GLASS OF WATER. DO NOT LIE DOWN FOR THE NEXT 30 MIN., Disp: , Rfl:  .  amLODipine (NORVASC) 2.5 MG tablet, Take 2.5 mg by mouth daily., Disp: , Rfl:  .  aspirin 81 MG tablet, Take 81 mg by mouth every evening. , Disp: , Rfl:  .  Biotin 1000 MCG tablet, Take 1,000 mcg by mouth daily., Disp: , Rfl:  .  cetirizine (ZYRTEC) 10 MG tablet, Take 10 mg by mouth every evening. , Disp: , Rfl:  .  dorzolamide-timolol (COSOPT) 22.3-6.8 MG/ML ophthalmic solution, Place 1 drop into both eyes 2 (two) times daily. , Disp: , Rfl:  .  exemestane (AROMASIN) 25 MG tablet, TAKE ONE TABLET BY MOUTH DAILY, Disp: 90 tablet, Rfl: 0 .  FLUoxetine (PROZAC) 20 MG capsule, Take 20 mg by mouth daily. , Disp: , Rfl:  .  fluticasone (FLONASE) 50 MCG/ACT nasal spray, Place 2 sprays into both nostrils daily. , Disp: , Rfl:  .  LORazepam (ATIVAN) 0.5 MG tablet, Take 0.5 mg by mouth at bedtime. , Disp: , Rfl:  .  losartan (COZAAR) 50 MG tablet, Take 50 mg by mouth every evening. , Disp: , Rfl:  .  Olopatadine HCl (PATADAY) 0.2 % SOLN, Place 1 drop into both eyes as needed. , Disp: , Rfl:  .   RABEprazole (ACIPHEX) 20 MG tablet, TAKE 1 TABLET (20 MG TOTAL) BY MOUTH ONCE DAILY., Disp: , Rfl:  .  rosuvastatin (CRESTOR) 5 MG tablet, Take 5 mg by mouth every other day. , Disp: , Rfl:  .  Travoprost, BAK Free, (TRAVATAN) 0.004 % SOLN ophthalmic solution, Place 1 drop into both eyes at bedtime., Disp: , Rfl:  .  Vitamin D, Ergocalciferol, (DRISDOL) 50000 UNITS CAPS capsule, Take 1 capsule by mouth once a week., Disp: , Rfl:  .  atropine 1 % ophthalmic solution, Apply to eye., Disp: , Rfl:  .  Difluprednate 0.05 % EMUL, Apply to eye., Disp: , Rfl:  .  rizatriptan (MAXALT) 10 MG tablet, Take 10 mg by mouth as needed for migraine. May repeat in 2 hours if needed, Disp: , Rfl:  Physical exam:  Vitals:   04/18/17 1054  BP: 123/73  Pulse: 65  Resp: 18  Temp: 98.3 F (36.8 C)  TempSrc: Tympanic  Weight: 161 lb 12.8 oz (73.4 kg)   Physical Exam  Constitutional: She is oriented to person, place, and time and well-developed, well-nourished, and in no distress.  HENT:  Head: Normocephalic and atraumatic.  Eyes: EOM are normal. Pupils are equal, round, and reactive to light.  Neck: Normal range of motion.  Cardiovascular: Normal rate, regular rhythm and normal heart sounds.   Pulmonary/Chest: Effort normal and breath sounds normal.  Abdominal: Soft. Bowel sounds are normal.  Neurological: She is alert and oriented to person, place, and time.  Skin: Skin is warm and dry.   Breast exam was performed in seated and lying down position. Patient is status post right lumpectomy with a well-healed surgical scar. No evidence of any palpable masses. No evidence of axillary adenopathy. She does have diffuse tenderness to palpation in her right breast. No evidence of any palpable masses or lumps in the left breast. No evidence of leftt axillary adenopathy   CMP Latest Ref Rng & Units 10/19/2016  Glucose 65 - 99 mg/dL 99  BUN 6 - 20 mg/dL 11  Creatinine 0.44 - 1.00 mg/dL 0.73  Sodium 135 - 145  mmol/L 136  Potassium 3.5 - 5.1 mmol/L 3.9  Chloride 101 - 111 mmol/L 104  CO2 22 - 32 mmol/L 26  Calcium 8.9 - 10.3 mg/dL 9.9  Total Protein 6.5 - 8.1 g/dL 7.3  Total Bilirubin 0.3 - 1.2 mg/dL 0.3  Alkaline Phos 38 - 126 U/L 87  AST 15 - 41 U/L 26  ALT 14 - 54 U/L 18   CBC Latest Ref Rng & Units 04/18/2017  WBC 3.6 - 11.0 K/uL 5.1  Hemoglobin 12.0 - 16.0 g/dL 11.1(L)  Hematocrit 35.0 - 47.0 % 33.8(L)  Platelets 150 - 440 K/uL 252    No images are attached to the encounter.  US Breast Ltd Uni Right Inc Axilla  Result Date: 04/14/2017 CLINICAL DATA:  82 year old female presenting for evaluation of right breast tenderness and lumps in the upper-outer quadrant. The patient had a fall in November of 2017. The breast was not injured at that time, however she has experienced tender and lumps near her lumpectomy site in the right breast since the injury. EXAM: 2D DIGITAL DIAGNOSTIC UNILATERAL RIGHT MAMMOGRAM WITH CAD AND ADJUNCT TOMO RIGHT BREAST ULTRASOUND COMPARISON:  Previous exam(s). ACR Breast Density Category b: There are scattered areas of fibroglandular density. FINDINGS: Two palpable markers have been placed on the upper-outer quadrant of the right breast. One overlies the lumpectomy site, and the other is near the axilla. There are no new suspicious mammographic findings identified deep to these markers. No suspicious calcifications, masses or areas of distortion are seen in the right breast. Mammographic images were processed with CAD. Physical exam of the upper-outer quadrant of the right breast demonstrates firm tissue at the lumpectomy site. There are no separate discrete palpable masses identified, though the patient does experience tenderness on palpation. Ultrasound targeted to the upper-outer quadrant of the right breast at 10 o'clock, 9 cm from the nipple demonstrates an oval anechoic circumscribed mass consistent with a small oil cyst at the lumpectomy site. This measures 1.1 x 0.6  x 1.0 cm. At 10 o'clock, 12 cm from the nipple at the second palpable tender site of concern there is normal fibroglandular tissue. No suspicious masses or areas of shadowing are  identified at either palpable site. IMPRESSION: 1. No abnormal mammographic or targeted sonographic findings are seen at the palpable tender sites of concern in the upper-outer right breast. RECOMMENDATION: 1. Clinical follow-up recommended for the palpable tender areas of concern in the right breast. Any further workup should be based on clinical grounds. As the areas of concern are near the lumpectomy site, if there is persisting clinical concern, MRI is recommended. 2. Bilateral diagnostic mammogram is recommended in July of 2018 to resume annual surveillance. I have discussed the findings and recommendations with the patient. Results were also provided in writing at the conclusion of the visit. If applicable, a reminder letter will be sent to the patient regarding the next appointment. BI-RADS CATEGORY  2: Benign. Electronically Signed   By: Ammie Ferrier M.D.   On: 04/14/2017 15:09   Mm Diag Breast Tomo Uni Right  Result Date: 04/14/2017 CLINICAL DATA:  82 year old female presenting for evaluation of right breast tenderness and lumps in the upper-outer quadrant. The patient had a fall in November of 2017. The breast was not injured at that time, however she has experienced tender and lumps near her lumpectomy site in the right breast since the injury. EXAM: 2D DIGITAL DIAGNOSTIC UNILATERAL RIGHT MAMMOGRAM WITH CAD AND ADJUNCT TOMO RIGHT BREAST ULTRASOUND COMPARISON:  Previous exam(s). ACR Breast Density Category b: There are scattered areas of fibroglandular density. FINDINGS: Two palpable markers have been placed on the upper-outer quadrant of the right breast. One overlies the lumpectomy site, and the other is near the axilla. There are no new suspicious mammographic findings identified deep to these markers. No suspicious  calcifications, masses or areas of distortion are seen in the right breast. Mammographic images were processed with CAD. Physical exam of the upper-outer quadrant of the right breast demonstrates firm tissue at the lumpectomy site. There are no separate discrete palpable masses identified, though the patient does experience tenderness on palpation. Ultrasound targeted to the upper-outer quadrant of the right breast at 10 o'clock, 9 cm from the nipple demonstrates an oval anechoic circumscribed mass consistent with a small oil cyst at the lumpectomy site. This measures 1.1 x 0.6 x 1.0 cm. At 10 o'clock, 12 cm from the nipple at the second palpable tender site of concern there is normal fibroglandular tissue. No suspicious masses or areas of shadowing are identified at either palpable site. IMPRESSION: 1. No abnormal mammographic or targeted sonographic findings are seen at the palpable tender sites of concern in the upper-outer right breast. RECOMMENDATION: 1. Clinical follow-up recommended for the palpable tender areas of concern in the right breast. Any further workup should be based on clinical grounds. As the areas of concern are near the lumpectomy site, if there is persisting clinical concern, MRI is recommended. 2. Bilateral diagnostic mammogram is recommended in July of 2018 to resume annual surveillance. I have discussed the findings and recommendations with the patient. Results were also provided in writing at the conclusion of the visit. If applicable, a reminder letter will be sent to the patient regarding the next appointment. BI-RADS CATEGORY  2: Benign. Electronically Signed   By: Ammie Ferrier M.D.   On: 04/14/2017 15:09     Assessment and plan- Patient is a 82 y.o. female who sees [place] for following medical issues  1 stage I right breast cancer: Continue Aromasin until August 2019. Clinically she is doing well and there is no evidence of recurrence on today's exam. She is due for bilateral  screening mammogram in  July 2018 and will see Dr. Jamal Collin following that. She will also continue to be on calcium and vitamin D  2. Osteoporosis: She has several years history of osteoporosis and has remained on alendronate for a long time. Recent bone density scan showed a T score of -3 and her lumbar spine. I will obtain prior death bone densities for comparison. Although patient is on alendronate, I'm concerned that she has been on this for early very long time and given her ongoing iron deficiency anemia have advised her to stop it as it may lead to GERD/esophagitis. I did discuss switching her to Prolia every 6 months until she is on Aromasin. However after reviewing risks and benefits of the medication including osteonecrosis of the jaw, patient would like to hold off on starting that at this time. We will repeat her bone density scan next year  3. Iron deficiency anemia patient has mild microcytic anemia. Currently she is not on any oral iron but we will have her start taking oral iron once a day. [facility] and I will refer her back to them and she has not had any recent GI workup.  I will see her back in 6 months time  With CBC and iron studies.    Visit Diagnosis 1. Malignant neoplasm of upper-outer quadrant of right breast in female, estrogen receptor positive ([place])   2. Microcytic anemia   3. Post-menopausal   4. Osteoporosis without current pathological fracture, unspecified osteoporosis type      Dr. [name], MD, MPH [facility] Pager- [phone] 04/18/2017 1:02 PM

## 2017-04-18 NOTE — Telephone Encounter (Signed)
Call to pt and informed her that Dr Janese Banks recommending that she make an appt to see a GI Dr. Call to [facility] clinic @ [phone] . Spoke w Butch Penny and appt made  For June 4@ 2pm. With Laurine Blazer PA   (note per Butch Penny pt failed Nov 7/17 and Dec 12/17 appts ) pt acknowledged understanding and stated that she would follow up with this appt. CAW

## 2017-04-18 NOTE — Progress Notes (Signed)
Please have her take oral iron once or twice a day if she can tolerate. Labs do indicate iron deficiency

## 2017-04-18 NOTE — Progress Notes (Signed)
Here for follow up doing well per pt . 

## 2017-04-19 ENCOUNTER — Other Ambulatory Visit: Payer: Medicare Other

## 2017-04-19 ENCOUNTER — Ambulatory Visit: Payer: Medicare Other

## 2017-04-19 DIAGNOSIS — L600 Ingrowing nail: Secondary | ICD-10-CM | POA: Insufficient documentation

## 2017-04-20 DIAGNOSIS — M25511 Pain in right shoulder: Secondary | ICD-10-CM | POA: Diagnosis not present

## 2017-04-20 DIAGNOSIS — M6281 Muscle weakness (generalized): Secondary | ICD-10-CM | POA: Diagnosis not present

## 2017-04-25 ENCOUNTER — Other Ambulatory Visit: Payer: Self-pay

## 2017-04-25 DIAGNOSIS — Z170 Estrogen receptor positive status [ER+]: Principal | ICD-10-CM

## 2017-04-25 DIAGNOSIS — M25511 Pain in right shoulder: Secondary | ICD-10-CM | POA: Diagnosis not present

## 2017-04-25 DIAGNOSIS — C50411 Malignant neoplasm of upper-outer quadrant of right female breast: Secondary | ICD-10-CM

## 2017-04-25 DIAGNOSIS — M6281 Muscle weakness (generalized): Secondary | ICD-10-CM | POA: Diagnosis not present

## 2017-04-28 ENCOUNTER — Ambulatory Visit (INDEPENDENT_AMBULATORY_CARE_PROVIDER_SITE_OTHER): Payer: Medicare Other | Admitting: Podiatry

## 2017-04-28 ENCOUNTER — Encounter: Payer: Self-pay | Admitting: Podiatry

## 2017-04-28 DIAGNOSIS — L600 Ingrowing nail: Secondary | ICD-10-CM | POA: Diagnosis not present

## 2017-04-28 DIAGNOSIS — M722 Plantar fascial fibromatosis: Secondary | ICD-10-CM | POA: Diagnosis not present

## 2017-04-28 DIAGNOSIS — Z9889 Other specified postprocedural states: Secondary | ICD-10-CM

## 2017-04-28 NOTE — Patient Instructions (Signed)

## 2017-05-02 DIAGNOSIS — M25511 Pain in right shoulder: Secondary | ICD-10-CM | POA: Diagnosis not present

## 2017-05-02 DIAGNOSIS — M6281 Muscle weakness (generalized): Secondary | ICD-10-CM | POA: Diagnosis not present

## 2017-05-04 NOTE — Progress Notes (Addendum)
Subjective: Abigail Shaw is a 82 y.o.  femaele returns to office today for follow up evaluation after having left Hallux lateral partial  nail avulsion performed. Patient has been soaking using epsom salts and applying topical antibiotic covered with bandaid daily. Denies any drainage, redness, swelling. Patient denies fevers, chills, nausea, vomiting. Denies any calf pain, chest pain, SOB.   Also today she states that she started have bilateral heel pain his history plantar fasciitis and she states this feels like what it was previously. He has pain in the bottom of both of her heels the morning she first gets up at during the day after being on her feet all day. She denies any recent injury or trauma. No numbness or tingling. The pain does not wake her up at night. She has no other complaints.  Objective:  Vitals: Reviewed  General: Well developed, nourished, in no acute distress, alert and oriented x3   Dermatology: Skin is warm, dry and supple bilateral. Left hallux nail border appears to be clean, dry, with mild granular tissue and surrounding scab. There is no surrounding erythema, edema, drainage/purulence. The remaining nails appear unremarkable at this time. There are no other lesions or other signs of infection present.  Neurovascular status: Intact. No lower extremity swelling; No pain with calf compression bilateral. Negative tinel sign bilaterally.   Musculoskeletal: Decreased tenderness to palpation of the lateral hallux nail fold. Muscular strength within normal limits bilateral. There is tenderness palpation on the plantar medial tubercle of the calcaneus at insertion of plantar fascial bilaterally. Is no pain along the course of plantar fascia within the arch of the foot. Mild equinus is present. No pain with lateral compression of the calcaneus.  Assesement and Plan: S/p partial nail avulsion, doing well.   -Continue soaking in epsom salts twice a day followed by antibiotic  ointment and a band-aid. Can leave uncovered at night. Continue this until completely healed.  -If the area has not healed in 2 weeks, call the office for follow-up appointment, or sooner if any problems arise.  -Monitor for any signs/symptoms of infection. Call the office immediately if any occur or go directly to the emergency room. Call with any questions/concerns.  Plantar fasciitis, bilateral -Patient elects to proceed with steroid injection into the left and right heel. Under sterile skin preparation, a total of 2.5cc of kenalog 10, 0.5% Marcaine plain, and 2% lidocaine plain were infiltrated into the symptomatic area without complication. A band-aid was applied. Patient tolerated the injection well without complication. Post-injection care with discussed with the patient. Discussed with the patient to ice the area over the next couple of days to help prevent a steroid flare.  -Plantar fascial brace -Stretching and icing daily -Shoegear changes.  -RTC 3 weeks or sooner if needed *x-rays next appointment if symptoms continue   Celesta Gentile, DPM

## 2017-05-08 NOTE — Addendum Note (Signed)
Addended by: Celesta Gentile R on: 05/08/2017 07:30 AM   Modules accepted: Level of Service

## 2017-05-09 DIAGNOSIS — N760 Acute vaginitis: Secondary | ICD-10-CM | POA: Diagnosis not present

## 2017-05-09 DIAGNOSIS — N949 Unspecified condition associated with female genital organs and menstrual cycle: Secondary | ICD-10-CM | POA: Diagnosis not present

## 2017-05-09 DIAGNOSIS — B9689 Other specified bacterial agents as the cause of diseases classified elsewhere: Secondary | ICD-10-CM | POA: Diagnosis not present

## 2017-05-15 ENCOUNTER — Ambulatory Visit: Payer: Medicare Other | Admitting: Podiatry

## 2017-05-17 DIAGNOSIS — M6281 Muscle weakness (generalized): Secondary | ICD-10-CM | POA: Diagnosis not present

## 2017-05-17 DIAGNOSIS — M25511 Pain in right shoulder: Secondary | ICD-10-CM | POA: Diagnosis not present

## 2017-05-18 DIAGNOSIS — M6281 Muscle weakness (generalized): Secondary | ICD-10-CM | POA: Diagnosis not present

## 2017-05-18 DIAGNOSIS — M25511 Pain in right shoulder: Secondary | ICD-10-CM | POA: Diagnosis not present

## 2017-05-23 DIAGNOSIS — M25511 Pain in right shoulder: Secondary | ICD-10-CM | POA: Diagnosis not present

## 2017-05-23 DIAGNOSIS — M6281 Muscle weakness (generalized): Secondary | ICD-10-CM | POA: Diagnosis not present

## 2017-05-25 DIAGNOSIS — I10 Essential (primary) hypertension: Secondary | ICD-10-CM | POA: Diagnosis not present

## 2017-05-25 DIAGNOSIS — Z0000 Encounter for general adult medical examination without abnormal findings: Secondary | ICD-10-CM | POA: Diagnosis not present

## 2017-05-25 DIAGNOSIS — E785 Hyperlipidemia, unspecified: Secondary | ICD-10-CM | POA: Diagnosis not present

## 2017-05-26 ENCOUNTER — Ambulatory Visit: Payer: Medicare Other | Admitting: Podiatry

## 2017-05-29 DIAGNOSIS — D649 Anemia, unspecified: Secondary | ICD-10-CM | POA: Diagnosis not present

## 2017-05-29 DIAGNOSIS — Z8371 Family history of colonic polyps: Secondary | ICD-10-CM | POA: Diagnosis not present

## 2017-05-31 ENCOUNTER — Ambulatory Visit: Payer: Medicare Other

## 2017-05-31 DIAGNOSIS — Z0000 Encounter for general adult medical examination without abnormal findings: Secondary | ICD-10-CM | POA: Diagnosis not present

## 2017-05-31 DIAGNOSIS — Z23 Encounter for immunization: Secondary | ICD-10-CM | POA: Diagnosis not present

## 2017-05-31 DIAGNOSIS — E785 Hyperlipidemia, unspecified: Secondary | ICD-10-CM | POA: Diagnosis not present

## 2017-05-31 DIAGNOSIS — I10 Essential (primary) hypertension: Secondary | ICD-10-CM | POA: Diagnosis not present

## 2017-06-01 DIAGNOSIS — M75111 Incomplete rotator cuff tear or rupture of right shoulder, not specified as traumatic: Secondary | ICD-10-CM | POA: Diagnosis not present

## 2017-06-01 DIAGNOSIS — S42221D 2-part displaced fracture of surgical neck of right humerus, subsequent encounter for fracture with routine healing: Secondary | ICD-10-CM | POA: Diagnosis not present

## 2017-06-01 IMAGING — CT CT ABD-PELV W/ CM
2 of 5 series · 17 of 46 positions shown, 19 images · IV contrast (APPLIED)
Comparison: None

CLINICAL DATA: 81-year-old female with right lower abdominal and
pelvic pain for 6 months, increasing recently.

EXAM:
CT ABDOMEN AND PELVIS WITH CONTRAST
TECHNIQUE: Multidetector CT imaging of the abdomen and pelvis was performed
using the standard protocol following bolus administration of
intravenous contrast.
CONTRAST:  100mL [id number] IOPAMIDOL (DDDW8S-JBB) INJECTION 61%

[Series 2: axial st · axial · 0.73mm/px · z∈[-917,-557]mm · 14 of 82 slices shown, 16 images]
[im 5/82  soft-tissue]
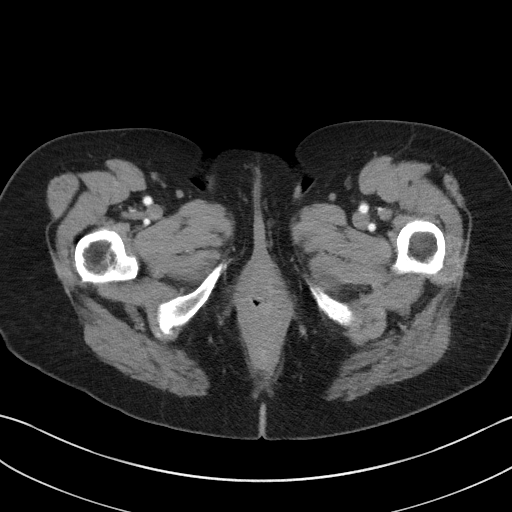
[im 5/82  bone]
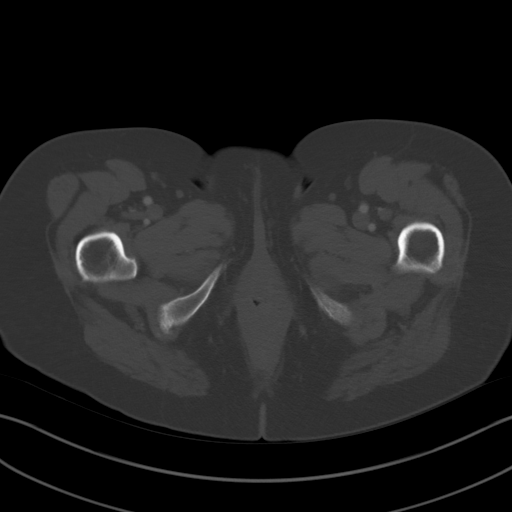
[im 10/82  soft-tissue]
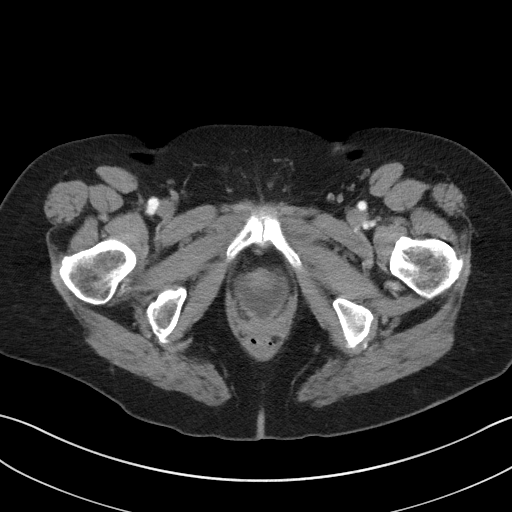
[im 19/82  soft-tissue]
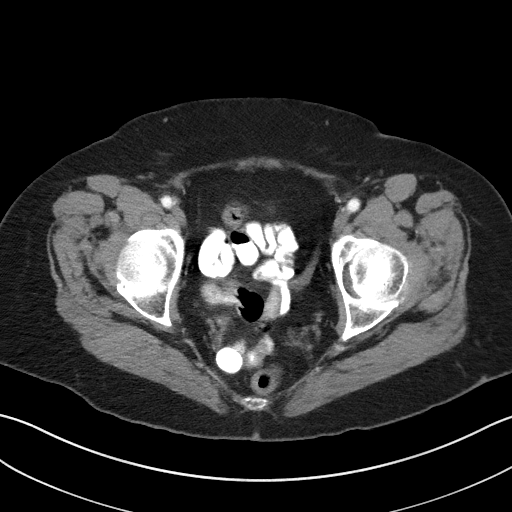
[im 23/82  soft-tissue]
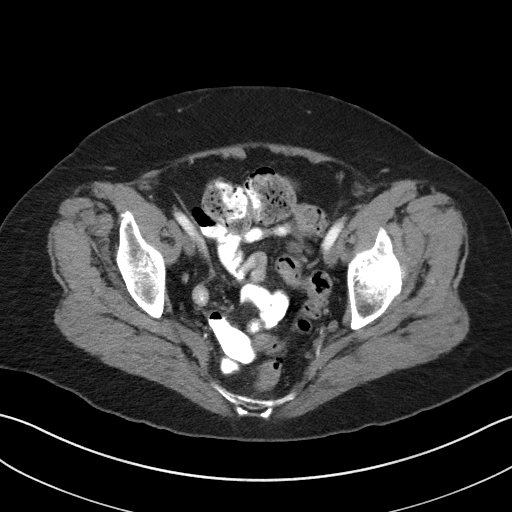
[im 28/82  soft-tissue]
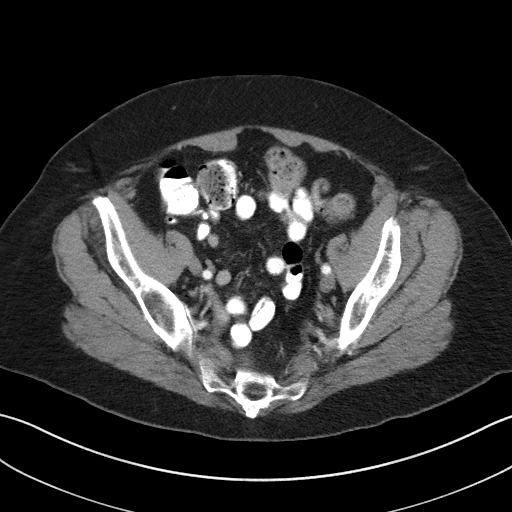
[im 32/82  soft-tissue]
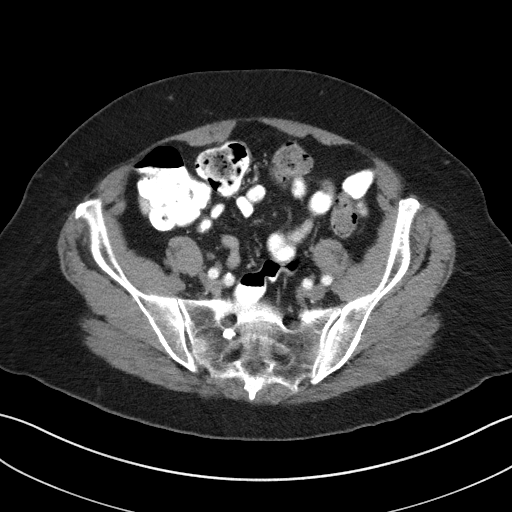
[im 37/82  soft-tissue]
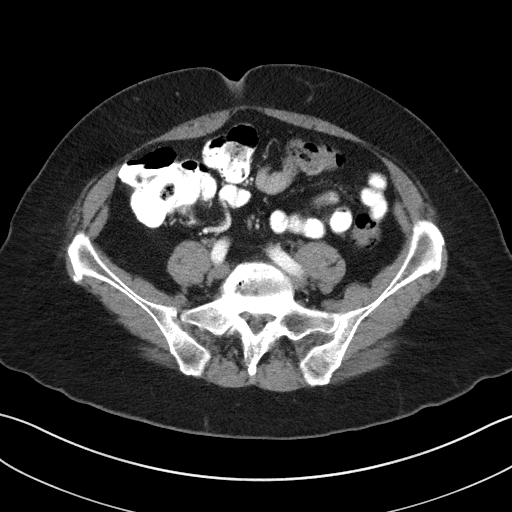
[im 46/82  soft-tissue]
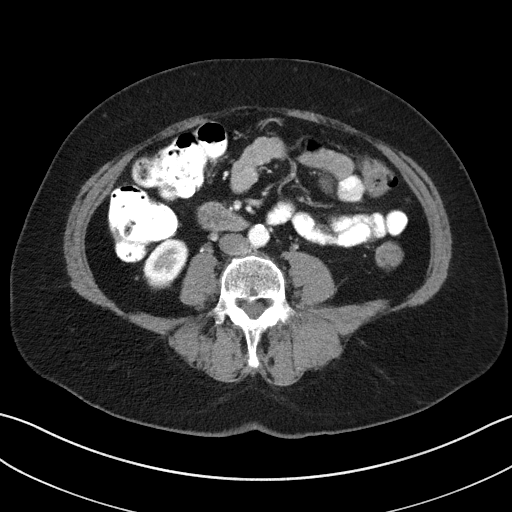
[im 50/82  soft-tissue]
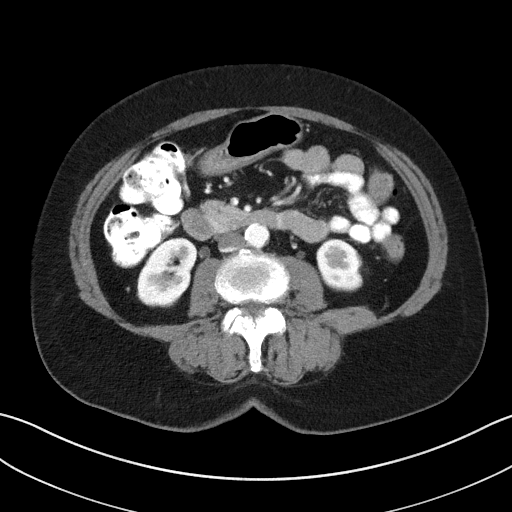
[im 50/82  bone]
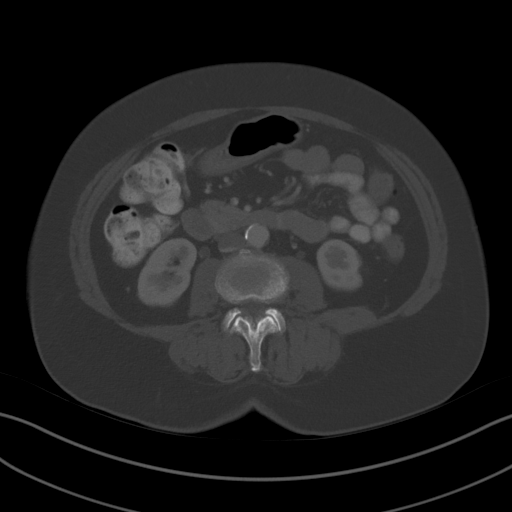
[im 55/82  soft-tissue]
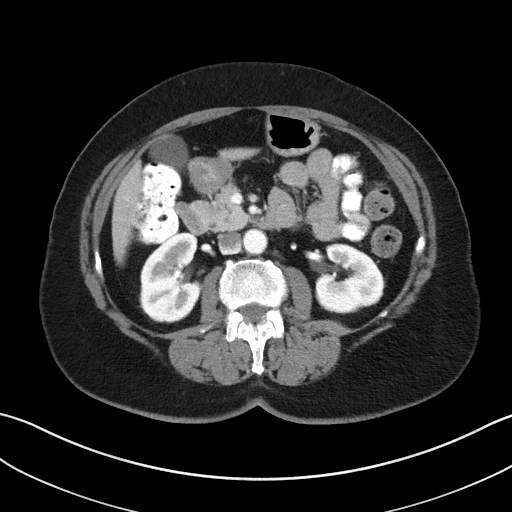
[im 59/82  soft-tissue]
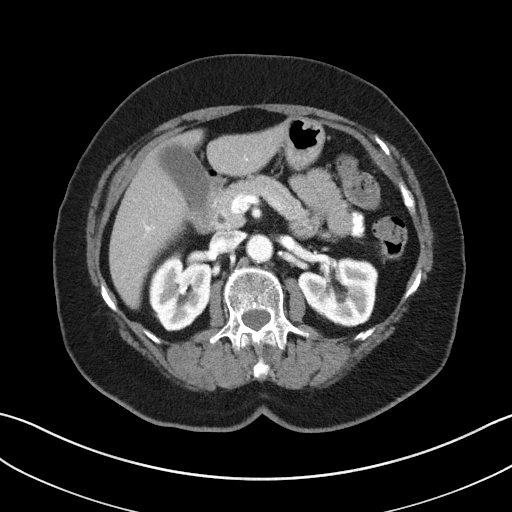
[im 64/82  soft-tissue]
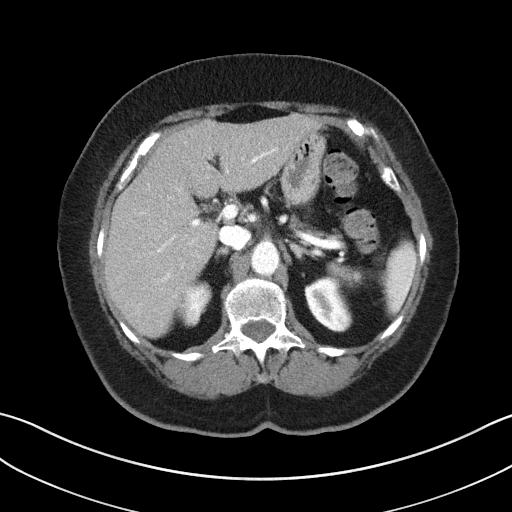
[im 73/82  soft-tissue]
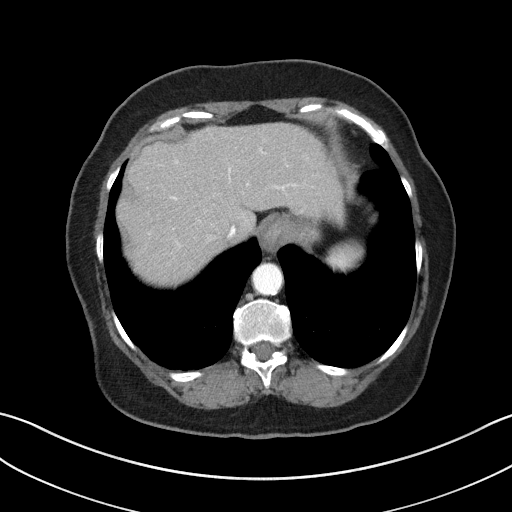
[im 77/82  soft-tissue]
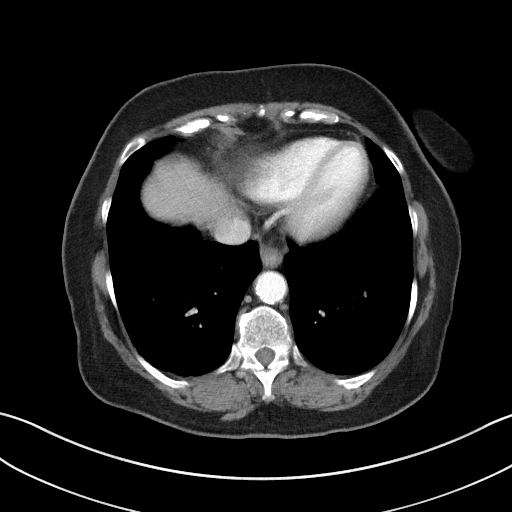

[Series 5: coronal st · coronal · 0.68mm/px · 3 of 90 slices shown]
[im 30/90  soft-tissue]
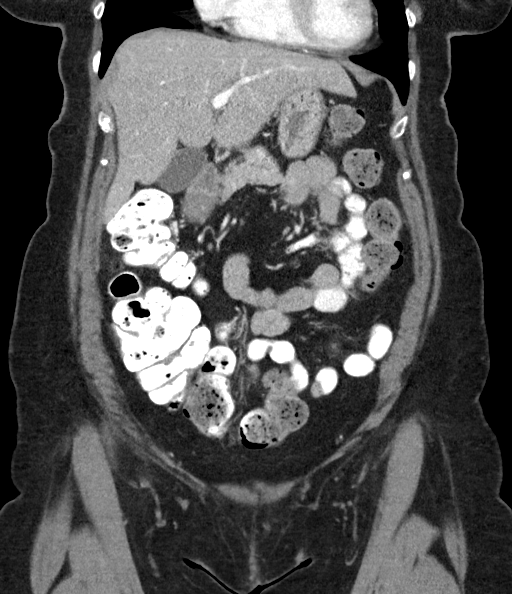
[im 40/90  soft-tissue]
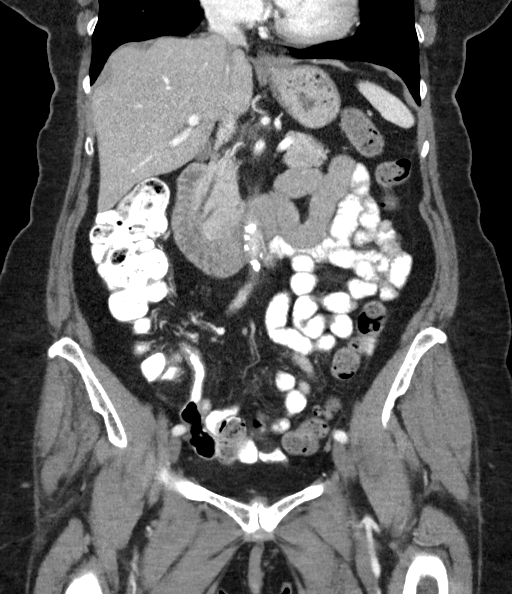
[im 50/90  soft-tissue]
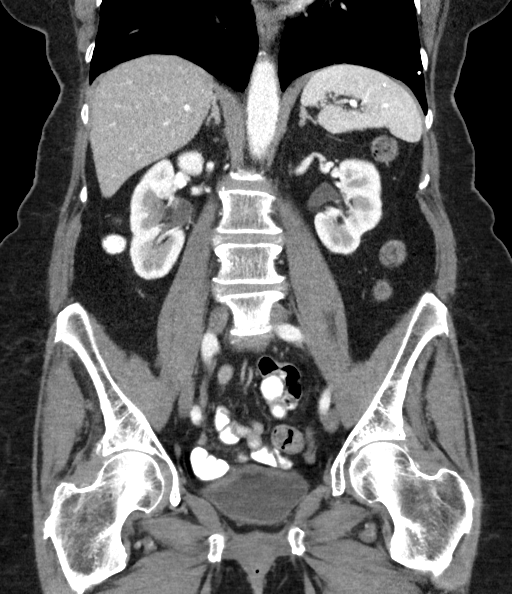

[17 of 46 positions shown; findings below may reference images not displayed]

FINDINGS: Lower chest: Unremarkable

Hepatobiliary: The liver and gallbladder are unremarkable. There is
no evidence of biliary dilatation.

Pancreas: Unremarkable

Spleen: Unremarkable except for calcified granulomas.

Adrenals/Urinary Tract: The kidneys, adrenal glands and bladder are
unremarkable.

Stomach/Bowel: Unremarkable. No bowel obstruction or definite bowel
wall thickening. The appendix is normal.

Vascular/Lymphatic: Abdominal aortic atherosclerotic calcifications
noted without aneurysm. No enlarged lymph nodes identified.

Reproductive: Patient is status post hysterectomy. No adnexal masses
noted.

Other: No free fluid, abscess or pneumoperitoneum.

Musculoskeletal: No acute or suspicious abnormalities identified.
IMPRESSION: No evidence of acute abnormality.

Abdominal aortic atherosclerosis.

## 2017-06-07 DIAGNOSIS — D649 Anemia, unspecified: Secondary | ICD-10-CM | POA: Diagnosis not present

## 2017-06-15 ENCOUNTER — Other Ambulatory Visit: Payer: Self-pay | Admitting: Oncology

## 2017-06-15 DIAGNOSIS — M9901 Segmental and somatic dysfunction of cervical region: Secondary | ICD-10-CM | POA: Diagnosis not present

## 2017-06-15 DIAGNOSIS — M5137 Other intervertebral disc degeneration, lumbosacral region: Secondary | ICD-10-CM | POA: Diagnosis not present

## 2017-06-15 DIAGNOSIS — M62830 Muscle spasm of back: Secondary | ICD-10-CM | POA: Diagnosis not present

## 2017-06-15 DIAGNOSIS — C50411 Malignant neoplasm of upper-outer quadrant of right female breast: Secondary | ICD-10-CM

## 2017-06-15 DIAGNOSIS — M9903 Segmental and somatic dysfunction of lumbar region: Secondary | ICD-10-CM | POA: Diagnosis not present

## 2017-06-19 DIAGNOSIS — M5137 Other intervertebral disc degeneration, lumbosacral region: Secondary | ICD-10-CM | POA: Diagnosis not present

## 2017-06-19 DIAGNOSIS — M9903 Segmental and somatic dysfunction of lumbar region: Secondary | ICD-10-CM | POA: Diagnosis not present

## 2017-06-19 DIAGNOSIS — M62830 Muscle spasm of back: Secondary | ICD-10-CM | POA: Diagnosis not present

## 2017-06-19 DIAGNOSIS — M9901 Segmental and somatic dysfunction of cervical region: Secondary | ICD-10-CM | POA: Diagnosis not present

## 2017-06-21 DIAGNOSIS — M9903 Segmental and somatic dysfunction of lumbar region: Secondary | ICD-10-CM | POA: Diagnosis not present

## 2017-06-21 DIAGNOSIS — M62830 Muscle spasm of back: Secondary | ICD-10-CM | POA: Diagnosis not present

## 2017-06-21 DIAGNOSIS — M9901 Segmental and somatic dysfunction of cervical region: Secondary | ICD-10-CM | POA: Diagnosis not present

## 2017-06-21 DIAGNOSIS — M5137 Other intervertebral disc degeneration, lumbosacral region: Secondary | ICD-10-CM | POA: Diagnosis not present

## 2017-06-22 DIAGNOSIS — M62830 Muscle spasm of back: Secondary | ICD-10-CM | POA: Diagnosis not present

## 2017-06-22 DIAGNOSIS — M9901 Segmental and somatic dysfunction of cervical region: Secondary | ICD-10-CM | POA: Diagnosis not present

## 2017-06-22 DIAGNOSIS — M5137 Other intervertebral disc degeneration, lumbosacral region: Secondary | ICD-10-CM | POA: Diagnosis not present

## 2017-06-22 DIAGNOSIS — M9903 Segmental and somatic dysfunction of lumbar region: Secondary | ICD-10-CM | POA: Diagnosis not present

## 2017-07-04 DIAGNOSIS — H401491 Capsular glaucoma with pseudoexfoliation of lens, unspecified eye, mild stage: Secondary | ICD-10-CM | POA: Diagnosis not present

## 2017-07-17 ENCOUNTER — Ambulatory Visit
Admission: RE | Admit: 2017-07-17 | Discharge: 2017-07-17 | Disposition: A | Discharge status: Home / Self Care | Payer: Medicare Other | Source: Ambulatory Visit | Attending: General Surgery | Admitting: General Surgery

## 2017-07-17 ENCOUNTER — Other Ambulatory Visit: Payer: Self-pay | Admitting: General Surgery

## 2017-07-17 DIAGNOSIS — C50411 Malignant neoplasm of upper-outer quadrant of right female breast: Secondary | ICD-10-CM

## 2017-07-17 DIAGNOSIS — Z853 Personal history of malignant neoplasm of breast: Secondary | ICD-10-CM | POA: Insufficient documentation

## 2017-07-17 DIAGNOSIS — Z170 Estrogen receptor positive status [ER+]: Principal | ICD-10-CM

## 2017-07-17 DIAGNOSIS — R928 Other abnormal and inconclusive findings on diagnostic imaging of breast: Secondary | ICD-10-CM | POA: Diagnosis not present

## 2017-07-19 DIAGNOSIS — H401491 Capsular glaucoma with pseudoexfoliation of lens, unspecified eye, mild stage: Secondary | ICD-10-CM | POA: Diagnosis not present

## 2017-07-27 ENCOUNTER — Ambulatory Visit (INDEPENDENT_AMBULATORY_CARE_PROVIDER_SITE_OTHER): Payer: Medicare Other | Admitting: General Surgery

## 2017-07-27 ENCOUNTER — Encounter: Payer: Self-pay | Admitting: General Surgery

## 2017-07-27 VITALS — BP 138/68 | HR 72 | Resp 12 | Ht 63.0 in | Wt 160.0 lb

## 2017-07-27 DIAGNOSIS — C50411 Malignant neoplasm of upper-outer quadrant of right female breast: Secondary | ICD-10-CM | POA: Diagnosis not present

## 2017-07-27 DIAGNOSIS — Z170 Estrogen receptor positive status [ER+]: Secondary | ICD-10-CM | POA: Diagnosis not present

## 2017-07-27 NOTE — Patient Instructions (Signed)
Patient will be asked to return to the office in one year with a bilateral diagnotic mammogram with DR. Byrnett.  The patient is aware to call back for any questions or concerns.

## 2017-07-27 NOTE — Progress Notes (Signed)
Patient ID: Abigail Shaw, female   DOB: Aug 01, 1935, 82 y.o.   MRN: [id number]  Chief Complaint  Patient presents with  . Follow-up    HPI Abigail Shaw is a 82 y.o. female who presents for a breast cancer follow-up. The most recent mammogram was done on 07/17/2017.  Patient does perform regular self breast checks and gets regular mammograms done.  Patient is currently on Aromasin and voices no complaints of  HPI  Past Medical History:  Diagnosis Date  . Breast cyst   . Cancer ([place]) 2014   right breast cancer  . Depression   . GERD (gastroesophageal reflux disease)   . Glaucoma   . Headache    migraines  . Hypercholesteremia   . Hypertension   . IBS (irritable bowel syndrome)   . Joint pain   . Motion sickness    fer[place], cars  . Wears dentures    partial upper (1 tooth)    Past Surgical History:  Procedure Laterality Date  . ABDOMINAL HYSTERECTOMY  1976  . BREAST LUMPECTOMY Right 2014   + mammo cite rad and aromasin  . BREAST MAMMOSITE Right 08-01-13  . BREAST SURGERY Right 07-02-2013   with sentinal biopsy  . BREAST SURGERY Right 07-18-13  . CATARACT EXTRACTION W/ INTRAOCULAR LENS  IMPLANT, BILATERAL    . COLONOSCOPY     Dr Candace Cruise  . FOOT SURGERY Left 12/05/14  . MM BREAST STEREO BX*R*R/S Right 2014  . PHOTOCOAGULATION WITH LASER Left 08/22/2016   Procedure: PHOTOCOAGULATION WITH LASER;  Surgeon: Ronnell Freshwater, MD;  Location: [facility];  Service: Ophthalmology;  Laterality: Left;  KEEP PT 2ND    Family History  Problem Relation Age of Onset  . Cancer Mother        age 8 breast  . Cancer Brother        prostate/lung/ liver /spleen  . Breast cancer Maternal Aunt        age 8  . Colon cancer Paternal Aunt     Social History Social History  Substance Use Topics  . Smoking status: Never Smoker  . Smokeless tobacco: Never Used  . Alcohol use No    Allergies  Allergen Reactions  . Lisinopril Cough       . Simbrinza  [Brinzolamide-Brimonidine]     lethargy  . Sulfamethoxazole-Trimethoprim     Other reaction(s): Other (See Comments) HYPONATREMIA    Current Outpatient Prescriptions  Medication Sig Dispense Refill  . amLODipine (NORVASC) 2.5 MG tablet Take 2.5 mg by mouth daily.    Marland Kitchen aspirin 81 MG tablet Take 81 mg by mouth every evening.     Marland Kitchen atropine 1 % ophthalmic solution Apply to eye.    . Biotin 1000 MCG tablet Take 1,000 mcg by mouth daily.    . cetirizine (ZYRTEC) 10 MG tablet Take 10 mg by mouth every evening.     . Difluprednate 0.05 % EMUL Apply to eye.    . dorzolamide-timolol (COSOPT) 22.3-6.8 MG/ML ophthalmic solution Place 1 drop into both eyes 2 (two) times daily.     Marland Kitchen exemestane (AROMASIN) 25 MG tablet TAKE ONE TABLET BY MOUTH DAILY 90 tablet 0  . FLUoxetine (PROZAC) 20 MG capsule Take 20 mg by mouth daily.     . fluticasone (FLONASE) 50 MCG/ACT nasal spray Place 2 sprays into both nostrils daily.     Marland Kitchen LORazepam (ATIVAN) 0.5 MG tablet Take 0.5 mg by mouth at bedtime.     Marland Kitchen losartan (  COZAAR) 50 MG tablet Take 50 mg by mouth every evening.     . Olopatadine HCl (PATADAY) 0.2 % SOLN Place 1 drop into both eyes as needed.     . RABEprazole (ACIPHEX) 20 MG tablet TAKE 1 TABLET (20 MG TOTAL) BY MOUTH ONCE DAILY.    . rizatriptan (MAXALT) 10 MG tablet Take 10 mg by mouth as needed for migraine. May repeat in 2 hours if needed    . rosuvastatin (CRESTOR) 5 MG tablet Take 5 mg by mouth every other day.     . Travoprost, BAK Free, (TRAVATAN) 0.004 % SOLN ophthalmic solution Place 1 drop into both eyes at bedtime.    . Vitamin D, Ergocalciferol, (DRISDOL) 50000 UNITS CAPS capsule Take 1 capsule by mouth once a week.     No current facility-administered medications for this visit.     Review of Systems Review of Systems  Constitutional: Negative.   Respiratory: Negative.   Cardiovascular: Negative.     Blood pressure 138/68, pulse 72, resp. rate 12, height '5\' 3"'  (1.6 m), weight 160 lb  (72.6 kg).  Physical Exam Physical Exam  Constitutional: She is oriented to person, place, and time. She appears well-developed and well-nourished.  Eyes: Conjunctivae are normal. No scleral icterus.  Neck: Neck supple.  Cardiovascular: Normal rate, regular rhythm and normal heart sounds.   Pulmonary/Chest: Effort normal and breath sounds normal. Right breast exhibits no inverted nipple, no mass, no nipple discharge, no skin change and no tenderness. Left breast exhibits no inverted nipple, no mass, no nipple discharge, no skin change and no tenderness.    Abdominal: Soft. Bowel sounds are normal. There is no tenderness.  Lymphadenopathy:    She has no cervical adenopathy.    She has no axillary adenopathy.  Neurological: She is alert and oriented to person, place, and time.  Skin: Skin is warm and dry.    Data Reviewed Mammogram reviewed   Assessment    Stable exam. Patient is 4 years post right breast lumpectomy and sentinel node biopsy for invasive mammary carcinoma. T1c, N0, ER/PR positive HER-2 negative.    Plan       Patient will be asked to return to the office in one year with a bilateral diagnotic mammogram with DR. Byrnett.  The patient is aware to call back for any questions or concerns. Continue follow up with oncology as scheduled  HPI, Physical Exam, Assessment and Plan have been scribed under the direction and in the presence of Mckinley Jewel, MD  Gaspar Cola, CMA I have completed the exam and reviewed the above documentation for accuracy and completeness.  I agree with the above.  Haematologist has been used and any errors in dictation or transcription are unintentional.  Seeplaputhur G. Jamal Collin, M.D., F.A.C.S.    Junie Panning G 07/28/2017, 8:56 AM

## 2017-08-01 DIAGNOSIS — L218 Other seborrheic dermatitis: Secondary | ICD-10-CM | POA: Diagnosis not present

## 2017-08-29 ENCOUNTER — Encounter: Payer: Self-pay | Admitting: Oncology

## 2017-09-18 ENCOUNTER — Other Ambulatory Visit: Payer: Self-pay | Admitting: *Deleted

## 2017-09-18 MED ORDER — EXEMESTANE 25 MG PO TABS: 25.0000 mg | ORAL_TABLET | Freq: Every day | ORAL | 0 refills | Status: DC

## 2017-10-16 ENCOUNTER — Other Ambulatory Visit: Payer: Self-pay | Admitting: *Deleted

## 2017-10-16 DIAGNOSIS — M62830 Muscle spasm of back: Secondary | ICD-10-CM | POA: Diagnosis not present

## 2017-10-16 DIAGNOSIS — M5137 Other intervertebral disc degeneration, lumbosacral region: Secondary | ICD-10-CM | POA: Diagnosis not present

## 2017-10-16 DIAGNOSIS — M9901 Segmental and somatic dysfunction of cervical region: Secondary | ICD-10-CM | POA: Diagnosis not present

## 2017-10-16 DIAGNOSIS — M9903 Segmental and somatic dysfunction of lumbar region: Secondary | ICD-10-CM | POA: Diagnosis not present

## 2017-10-16 NOTE — Telephone Encounter (Signed)
Patient got 90 day supply on 9/24 Pharmacy notified

## 2017-10-18 DIAGNOSIS — M9901 Segmental and somatic dysfunction of cervical region: Secondary | ICD-10-CM | POA: Diagnosis not present

## 2017-10-18 DIAGNOSIS — L218 Other seborrheic dermatitis: Secondary | ICD-10-CM | POA: Diagnosis not present

## 2017-10-18 DIAGNOSIS — L7211 Pilar cyst: Secondary | ICD-10-CM | POA: Diagnosis not present

## 2017-10-18 DIAGNOSIS — M9903 Segmental and somatic dysfunction of lumbar region: Secondary | ICD-10-CM | POA: Diagnosis not present

## 2017-10-18 DIAGNOSIS — L814 Other melanin hyperpigmentation: Secondary | ICD-10-CM | POA: Diagnosis not present

## 2017-10-18 DIAGNOSIS — X32XXXA Exposure to sunlight, initial encounter: Secondary | ICD-10-CM | POA: Diagnosis not present

## 2017-10-18 DIAGNOSIS — M5137 Other intervertebral disc degeneration, lumbosacral region: Secondary | ICD-10-CM | POA: Diagnosis not present

## 2017-10-18 DIAGNOSIS — M62830 Muscle spasm of back: Secondary | ICD-10-CM | POA: Diagnosis not present

## 2017-10-19 ENCOUNTER — Inpatient Hospital Stay: Payer: Medicare Other | Attending: Oncology | Admitting: Oncology

## 2017-10-19 ENCOUNTER — Inpatient Hospital Stay: Payer: Medicare Other

## 2017-10-19 ENCOUNTER — Encounter: Payer: Self-pay | Admitting: Oncology

## 2017-10-19 VITALS — BP 165/71 | HR 54 | Temp 97.4°F | Resp 14 | Wt 161.0 lb

## 2017-10-19 DIAGNOSIS — I10 Essential (primary) hypertension: Secondary | ICD-10-CM | POA: Diagnosis not present

## 2017-10-19 DIAGNOSIS — Z7982 Long term (current) use of aspirin: Secondary | ICD-10-CM | POA: Insufficient documentation

## 2017-10-19 DIAGNOSIS — M62830 Muscle spasm of back: Secondary | ICD-10-CM | POA: Diagnosis not present

## 2017-10-19 DIAGNOSIS — K589 Irritable bowel syndrome without diarrhea: Secondary | ICD-10-CM | POA: Insufficient documentation

## 2017-10-19 DIAGNOSIS — Z79899 Other long term (current) drug therapy: Secondary | ICD-10-CM

## 2017-10-19 DIAGNOSIS — M810 Age-related osteoporosis without current pathological fracture: Secondary | ICD-10-CM | POA: Diagnosis not present

## 2017-10-19 DIAGNOSIS — Z882 Allergy status to sulfonamides status: Secondary | ICD-10-CM | POA: Insufficient documentation

## 2017-10-19 DIAGNOSIS — Z780 Asymptomatic menopausal state: Secondary | ICD-10-CM | POA: Insufficient documentation

## 2017-10-19 DIAGNOSIS — Z170 Estrogen receptor positive status [ER+]: Secondary | ICD-10-CM | POA: Insufficient documentation

## 2017-10-19 DIAGNOSIS — C50411 Malignant neoplasm of upper-outer quadrant of right female breast: Secondary | ICD-10-CM

## 2017-10-19 DIAGNOSIS — C50912 Malignant neoplasm of unspecified site of left female breast: Secondary | ICD-10-CM

## 2017-10-19 DIAGNOSIS — M9901 Segmental and somatic dysfunction of cervical region: Secondary | ICD-10-CM | POA: Diagnosis not present

## 2017-10-19 DIAGNOSIS — E7800 Pure hypercholesterolemia, unspecified: Secondary | ICD-10-CM | POA: Insufficient documentation

## 2017-10-19 DIAGNOSIS — Z803 Family history of malignant neoplasm of breast: Secondary | ICD-10-CM | POA: Diagnosis not present

## 2017-10-19 DIAGNOSIS — D509 Iron deficiency anemia, unspecified: Secondary | ICD-10-CM

## 2017-10-19 DIAGNOSIS — Z23 Encounter for immunization: Secondary | ICD-10-CM | POA: Diagnosis not present

## 2017-10-19 DIAGNOSIS — K219 Gastro-esophageal reflux disease without esophagitis: Secondary | ICD-10-CM | POA: Diagnosis not present

## 2017-10-19 DIAGNOSIS — M9903 Segmental and somatic dysfunction of lumbar region: Secondary | ICD-10-CM | POA: Diagnosis not present

## 2017-10-19 DIAGNOSIS — Z923 Personal history of irradiation: Secondary | ICD-10-CM | POA: Insufficient documentation

## 2017-10-19 DIAGNOSIS — Z800 Family history of malignant neoplasm of digestive organs: Secondary | ICD-10-CM | POA: Insufficient documentation

## 2017-10-19 DIAGNOSIS — Z79811 Long term (current) use of aromatase inhibitors: Secondary | ICD-10-CM | POA: Insufficient documentation

## 2017-10-19 DIAGNOSIS — M5137 Other intervertebral disc degeneration, lumbosacral region: Secondary | ICD-10-CM | POA: Diagnosis not present

## 2017-10-19 LAB — CBC
HEMATOCRIT: 35.4 % (ref 35.0–47.0)
Hemoglobin: 11.7 g/dL — ABNORMAL LOW (ref 12.0–16.0)
MCH: 26.4 pg (ref 26.0–34.0)
MCHC: 33 g/dL (ref 32.0–36.0)
MCV: 80 fL (ref 80.0–100.0)
Platelets: 198 10*3/uL (ref 150–440)
RBC: 4.43 MIL/uL (ref 3.80–5.20)
RDW: 17.1 % — ABNORMAL HIGH (ref 11.5–14.5)
WBC: 4 10*3/uL (ref 3.6–11.0)

## 2017-10-19 LAB — IRON AND TIBC
Iron: 49 ug/dL (ref 28–170)
SATURATION RATIOS: 11 % (ref 10.4–31.8)
TIBC: 435 ug/dL (ref 250–450)
UIBC: 386 ug/dL

## 2017-10-19 LAB — FERRITIN: FERRITIN: 19 ng/mL (ref 11–307)

## 2017-10-19 NOTE — Progress Notes (Signed)
Patient here for follow up with labs. She states that she is feeling fairly well today except for minor aches and pains. She is planning to get her Flu Vaccine today at [facility].

## 2017-10-19 NOTE — Progress Notes (Signed)
Hematology/Oncology Consult note [facility]  [phone] [phone]  Patient Care Team: [name], MD as PCP - General (Family Medicine) Christene Lye, MD (General Surgery) [name], MD as Referring Physician (Family Medicine)   Name of the patient: Abigail Shaw  [id number]  01/14/1935   Date of visit: 10/19/17  Diagnosis- 1. Stage 1 breast cancer on aromasin 2. Iron deficiency anemia  Chief complaint/ Reason for visit- f/u of breast cancer and iron deficiency  Heme/Onc history: 1. 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 Tumor size 1.5 cm, grade 1, 2 sentinel nodes negative for metastasis. ER positive (80%), PR positive (90%). HER-2/neu negative by FISH (Her2/CEP17 ratio 1.15). Oncotype DX test reportedly had a score of 18. Started adjuvant hormonal therapy with Anastrazole in Aug 2014 changed to exemestane January 2015.  2. History of mild osteoporosis -on calcium + vitamin D. Fosamax stopped. She did not wish to pursue parenteral bisphosphonates  3. Iron deficiency- she could not tolerate oral iron. See n by [facility] clinic GI and will go through colonoscopy this week  Interval history- tolerating aromasin well without any side effects. She is compliant with calcium and vitamin D. She has not been able to tolerate oral iron due to abdominal pain and constipation  ECOG PS- 0 Pain scale- 0   Review of systems- Review of Systems  Constitutional: Negative for chills, fever, malaise/fatigue and weight loss.  HENT: Negative for congestion, ear discharge and nosebleeds.   Eyes: Negative for blurred vision.  Respiratory: Negative for cough, hemoptysis, sputum production, shortness of breath and wheezing.   Cardiovascular: Negative for chest pain, palpitations, orthopnea and  claudication.  Gastrointestinal: Negative for abdominal pain, blood in stool, constipation, diarrhea, heartburn, melena, nausea and vomiting.  Genitourinary: Negative for dysuria, flank pain, frequency, hematuria and urgency.  Musculoskeletal: Negative for back pain, joint pain and myalgias.  Skin: Negative for rash.  Neurological: Negative for dizziness, tingling, focal weakness, seizures, weakness and headaches.  Endo/Heme/Allergies: Does not bruise/bleed easily.  Psychiatric/Behavioral: Negative for depression and suicidal ideas. The patient does not have insomnia.    Breast exam was performed in seated and lying down position. Patient is status post right lumpectomy with a well-healed surgical scar. No evidence of any palpable masses. No evidence of axillary adenopathy. No evidence of any palpable masses or lumps in the left breast. No evidence of leftt axillary adenopathy   Allergies  Allergen Reactions  . Lisinopril Cough       . Simbrinza [Brinzolamide-Brimonidine]     lethargy  . Sulfamethoxazole-Trimethoprim     Other reaction(s): Other (See Comments) HYPONATREMIA     Past Medical History:  Diagnosis Date  . Breast cyst   . Cancer ([place]) 2014   right breast cancer  . Depression   . GERD (gastroesophageal reflux disease)   . Glaucoma   . Headache    migraines  . Hypercholesteremia   . Hypertension   . IBS (irritable bowel syndrome)   . Joint pain   . Motion sickness    [place], cars  . Wears dentures    partial upper (1 tooth)     Past Surgical History:  Procedure Laterality Date  . ABDOMINAL HYSTERECTOMY  1976  . BREAST LUMPECTOMY Right 2014   + mammo cite rad and aromasin  . BREAST MAMMOSITE Right 08-01-13  . BREAST  SURGERY Right 07-02-2013   with sentinal biopsy  . BREAST SURGERY Right 07-18-13  . CATARACT EXTRACTION W/ INTRAOCULAR LENS  IMPLANT, BILATERAL    . COLONOSCOPY     Dr Candace Cruise  . FOOT SURGERY Left 12/05/14  . MM BREAST STEREO BX*R*R/S Right 2014  .  PHOTOCOAGULATION WITH LASER Left 08/22/2016   Procedure: PHOTOCOAGULATION WITH LASER;  Surgeon: Ronnell Freshwater, MD;  Location: [facility];  Service: Ophthalmology;  Laterality: Left;  KEEP PT 2ND    Social History   Social History  . Marital status: Married    Spouse name: N/A  . Number of children: 5  . Years of education: 12   Occupational History  . Retired    Social History Main Topics  . Smoking status: Never Smoker  . Smokeless tobacco: Never Used  . Alcohol use No  . Drug use: No  . Sexual activity: Not on file   Other Topics Concern  . Not on file   Social History Narrative   Lives with husband   Caffeine use:  none    Family History  Problem Relation Age of Onset  . Cancer Mother        age 60 breast  . Cancer Brother        prostate/lung/ liver /spleen  . Breast cancer Maternal Aunt        age 45  . Colon cancer Paternal Aunt      Current Outpatient Prescriptions:  .  amLODipine (NORVASC) 2.5 MG tablet, Take 2.5 mg by mouth daily., Disp: , Rfl:  .  aspirin 81 MG tablet, Take 81 mg by mouth every evening. , Disp: , Rfl:  .  atropine 1 % ophthalmic solution, Apply to eye., Disp: , Rfl:  .  Biotin 1000 MCG tablet, Take 1,000 mcg by mouth daily., Disp: , Rfl:  .  cetirizine (ZYRTEC) 10 MG tablet, Take 10 mg by mouth every evening. , Disp: , Rfl:  .  dorzolamide-timolol (COSOPT) 22.3-6.8 MG/ML ophthalmic solution, Place 1 drop into both eyes 2 (two) times daily. , Disp: , Rfl:  .  exemestane (AROMASIN) 25 MG tablet, Take 1 tablet (25 mg total) by mouth daily., Disp: 90 tablet, Rfl: 0 .  FLUoxetine (PROZAC) 20 MG capsule, Take 20 mg by mouth daily. , Disp: , Rfl:  .  fluticasone (FLONASE) 50 MCG/ACT nasal spray, Place 2 sprays into both nostrils daily. , Disp: , Rfl:  .  LORazepam (ATIVAN) 0.5 MG tablet, Take 0.5 mg by mouth at bedtime. , Disp: , Rfl:  .  losartan (COZAAR) 50 MG tablet, Take 50 mg by mouth every evening. , Disp: , Rfl:  .   RABEprazole (ACIPHEX) 20 MG tablet, TAKE 1 TABLET (20 MG TOTAL) BY MOUTH ONCE DAILY., Disp: , Rfl:  .  rizatriptan (MAXALT) 10 MG tablet, Take 10 mg by mouth as needed for migraine. May repeat in 2 hours if needed, Disp: , Rfl:  .  rosuvastatin (CRESTOR) 5 MG tablet, Take 5 mg by mouth every other day. , Disp: , Rfl:  .  Travoprost, BAK Free, (TRAVATAN) 0.004 % SOLN ophthalmic solution, Place 1 drop into both eyes at bedtime., Disp: , Rfl:  .  Vitamin D, Ergocalciferol, (DRISDOL) 50000 UNITS CAPS capsule, Take 1 capsule by mouth once a week., Disp: , Rfl:   Physical exam:  Vitals:   10/19/17 1109  BP: (!) 165/71  Pulse: (!) 54  Resp: 14  Temp: (!) 97.4 F (36.3 C)  TempSrc: Tympanic  Weight: 161 lb (73 kg)   Physical Exam  Constitutional: She is oriented to person, place, and time and well-developed, well-nourished, and in no distress.  HENT:  Head: Normocephalic and atraumatic.  Eyes: Pupils are equal, round, and reactive to light. EOM are normal.  Neck: Normal range of motion.  Cardiovascular: Normal rate, regular rhythm and normal heart sounds.   Pulmonary/Chest: Effort normal and breath sounds normal.  Abdominal: Soft. Bowel sounds are normal.  Neurological: She is alert and oriented to person, place, and time.  Skin: Skin is warm and dry.     CMP Latest Ref Rng & Units 10/19/2016  Glucose 65 - 99 mg/dL 99  BUN 6 - 20 mg/dL 11  Creatinine 0.44 - 1.00 mg/dL 0.73  Sodium 135 - 145 mmol/L 136  Potassium 3.5 - 5.1 mmol/L 3.9  Chloride 101 - 111 mmol/L 104  CO2 22 - 32 mmol/L 26  Calcium 8.9 - 10.3 mg/dL 9.9  Total Protein 6.5 - 8.1 g/dL 7.3  Total Bilirubin 0.3 - 1.2 mg/dL 0.3  Alkaline Phos 38 - 126 U/L 87  AST 15 - 41 U/L 26  ALT 14 - 54 U/L 18   CBC Latest Ref Rng & Units 10/19/2017  WBC 3.6 - 11.0 K/uL 4.0  Hemoglobin 12.0 - 16.0 g/dL 11.7(L)  Hematocrit 35.0 - 47.0 % 35.4  Platelets 150 - 440 K/uL 198      Assessment and plan- Patient is a 82 y.o. female who  sees me for following medical issues:  1. Stage I right breast cancer- clinically she is doing well and there is no evidence of recurrence on today's exam. Her mammograms will be ordered through surgery. She will continue Aromasin until August 2019 and also continue with calcium and vitamin D supplements.  2. Osteoporosis: Fosamax was stopped as she had been a long time and was now developing iron deficiency anemia and reflux issues. She did not want to proceed with pa bisphosphonates. Her last bone density scan was in March 2017 which showed osteoporosis that was stable. I will plan to repeat another bone density scan in March 2019.  3. Iron deficiency anemia: Patient has evidence of iron deficiency and mild anemia. She has not been able to tolerate oral iron. I did discuss the risks and benefits of IV iron and given that she is not symptomatic from her iron deficiency, I will wait to see for iron deficiency. At her up for cause is found on colonoscopy. I will repeat CBC ferritin and iron studies in 6 months time and see her thereafter   Visit Diagnosis 1. Malignant neoplasm of upper-outer quadrant of right breast in female, estrogen receptor positive ([place])   2. Iron deficiency anemia, unspecified iron deficiency anemia type   3. Carcinoma of left breast treated with adjuvant hormone therapy ([place])      Dr. [name], MD, MPH [facility] Pager- [phone] 10/19/2017 3:11 PM

## 2017-10-23 DIAGNOSIS — M62830 Muscle spasm of back: Secondary | ICD-10-CM | POA: Diagnosis not present

## 2017-10-23 DIAGNOSIS — M9903 Segmental and somatic dysfunction of lumbar region: Secondary | ICD-10-CM | POA: Diagnosis not present

## 2017-10-23 DIAGNOSIS — M9901 Segmental and somatic dysfunction of cervical region: Secondary | ICD-10-CM | POA: Diagnosis not present

## 2017-10-23 DIAGNOSIS — H401491 Capsular glaucoma with pseudoexfoliation of lens, unspecified eye, mild stage: Secondary | ICD-10-CM | POA: Diagnosis not present

## 2017-10-23 DIAGNOSIS — M5137 Other intervertebral disc degeneration, lumbosacral region: Secondary | ICD-10-CM | POA: Diagnosis not present

## 2017-10-25 DIAGNOSIS — M62830 Muscle spasm of back: Secondary | ICD-10-CM | POA: Diagnosis not present

## 2017-10-25 DIAGNOSIS — M9903 Segmental and somatic dysfunction of lumbar region: Secondary | ICD-10-CM | POA: Diagnosis not present

## 2017-10-25 DIAGNOSIS — M9901 Segmental and somatic dysfunction of cervical region: Secondary | ICD-10-CM | POA: Diagnosis not present

## 2017-10-25 DIAGNOSIS — M5137 Other intervertebral disc degeneration, lumbosacral region: Secondary | ICD-10-CM | POA: Diagnosis not present

## 2017-10-26 ENCOUNTER — Encounter: Payer: Self-pay | Admitting: *Deleted

## 2017-10-27 ENCOUNTER — Encounter: Admission: RE | Payer: Self-pay | Source: Ambulatory Visit

## 2017-10-27 ENCOUNTER — Ambulatory Visit: Admission: RE | Admit: 2017-10-27 | Payer: Medicare Other | Source: Ambulatory Visit | Admitting: Gastroenterology

## 2017-10-27 SURGERY — COLONOSCOPY WITH PROPOFOL
Anesthesia: General

## 2017-10-27 MED ORDER — LIDOCAINE HCL (PF) 2 % IJ SOLN
INTRAMUSCULAR | Status: AC
  Filled 2017-10-27: qty 10

## 2017-10-27 MED ORDER — PROPOFOL 500 MG/50ML IV EMUL
INTRAVENOUS | Status: AC
  Filled 2017-10-27: qty 50

## 2017-10-30 DIAGNOSIS — M62830 Muscle spasm of back: Secondary | ICD-10-CM | POA: Diagnosis not present

## 2017-10-30 DIAGNOSIS — M9901 Segmental and somatic dysfunction of cervical region: Secondary | ICD-10-CM | POA: Diagnosis not present

## 2017-10-30 DIAGNOSIS — M5137 Other intervertebral disc degeneration, lumbosacral region: Secondary | ICD-10-CM | POA: Diagnosis not present

## 2017-10-30 DIAGNOSIS — M9903 Segmental and somatic dysfunction of lumbar region: Secondary | ICD-10-CM | POA: Diagnosis not present

## 2017-11-02 DIAGNOSIS — H401491 Capsular glaucoma with pseudoexfoliation of lens, unspecified eye, mild stage: Secondary | ICD-10-CM | POA: Diagnosis not present

## 2017-11-03 ENCOUNTER — Other Ambulatory Visit
Admission: RE | Admit: 2017-11-03 | Discharge: 2017-11-03 | Disposition: A | Discharge status: Home / Self Care | Payer: Medicare Other | Source: Other Acute Inpatient Hospital | Attending: Internal Medicine | Admitting: Internal Medicine

## 2017-11-03 DIAGNOSIS — R197 Diarrhea, unspecified: Secondary | ICD-10-CM | POA: Insufficient documentation

## 2017-11-03 LAB — C DIFFICILE QUICK SCREEN W PCR REFLEX
C DIFFICILE (CDIFF) TOXIN: NEGATIVE
C DIFFICLE (CDIFF) ANTIGEN: NEGATIVE
C Diff interpretation: NOT DETECTED

## 2017-11-03 LAB — LACTOFERRIN, FECAL, QUALITATIVE: LACTOFERRIN, FECAL, QUAL: NEGATIVE

## 2017-11-08 DIAGNOSIS — M62830 Muscle spasm of back: Secondary | ICD-10-CM | POA: Diagnosis not present

## 2017-11-08 DIAGNOSIS — M9901 Segmental and somatic dysfunction of cervical region: Secondary | ICD-10-CM | POA: Diagnosis not present

## 2017-11-08 DIAGNOSIS — M9903 Segmental and somatic dysfunction of lumbar region: Secondary | ICD-10-CM | POA: Diagnosis not present

## 2017-11-08 DIAGNOSIS — M5137 Other intervertebral disc degeneration, lumbosacral region: Secondary | ICD-10-CM | POA: Diagnosis not present

## 2017-12-08 DIAGNOSIS — M9902 Segmental and somatic dysfunction of thoracic region: Secondary | ICD-10-CM | POA: Diagnosis not present

## 2017-12-08 DIAGNOSIS — M9903 Segmental and somatic dysfunction of lumbar region: Secondary | ICD-10-CM | POA: Diagnosis not present

## 2017-12-08 DIAGNOSIS — S233XXA Sprain of ligaments of thoracic spine, initial encounter: Secondary | ICD-10-CM | POA: Diagnosis not present

## 2017-12-08 DIAGNOSIS — S335XXA Sprain of ligaments of lumbar spine, initial encounter: Secondary | ICD-10-CM | POA: Diagnosis not present

## 2017-12-11 DIAGNOSIS — M9902 Segmental and somatic dysfunction of thoracic region: Secondary | ICD-10-CM | POA: Diagnosis not present

## 2017-12-11 DIAGNOSIS — M9903 Segmental and somatic dysfunction of lumbar region: Secondary | ICD-10-CM | POA: Diagnosis not present

## 2017-12-11 DIAGNOSIS — S233XXA Sprain of ligaments of thoracic spine, initial encounter: Secondary | ICD-10-CM | POA: Diagnosis not present

## 2017-12-11 DIAGNOSIS — S335XXA Sprain of ligaments of lumbar spine, initial encounter: Secondary | ICD-10-CM | POA: Diagnosis not present

## 2017-12-13 DIAGNOSIS — S233XXA Sprain of ligaments of thoracic spine, initial encounter: Secondary | ICD-10-CM | POA: Diagnosis not present

## 2017-12-13 DIAGNOSIS — S335XXA Sprain of ligaments of lumbar spine, initial encounter: Secondary | ICD-10-CM | POA: Diagnosis not present

## 2017-12-13 DIAGNOSIS — M9903 Segmental and somatic dysfunction of lumbar region: Secondary | ICD-10-CM | POA: Diagnosis not present

## 2017-12-13 DIAGNOSIS — M9902 Segmental and somatic dysfunction of thoracic region: Secondary | ICD-10-CM | POA: Diagnosis not present

## 2017-12-18 DIAGNOSIS — M9903 Segmental and somatic dysfunction of lumbar region: Secondary | ICD-10-CM | POA: Diagnosis not present

## 2017-12-18 DIAGNOSIS — S335XXA Sprain of ligaments of lumbar spine, initial encounter: Secondary | ICD-10-CM | POA: Diagnosis not present

## 2017-12-18 DIAGNOSIS — M9902 Segmental and somatic dysfunction of thoracic region: Secondary | ICD-10-CM | POA: Diagnosis not present

## 2017-12-18 DIAGNOSIS — S233XXA Sprain of ligaments of thoracic spine, initial encounter: Secondary | ICD-10-CM | POA: Diagnosis not present

## 2017-12-21 DIAGNOSIS — S233XXA Sprain of ligaments of thoracic spine, initial encounter: Secondary | ICD-10-CM | POA: Diagnosis not present

## 2017-12-21 DIAGNOSIS — S335XXA Sprain of ligaments of lumbar spine, initial encounter: Secondary | ICD-10-CM | POA: Diagnosis not present

## 2017-12-21 DIAGNOSIS — M9902 Segmental and somatic dysfunction of thoracic region: Secondary | ICD-10-CM | POA: Diagnosis not present

## 2017-12-21 DIAGNOSIS — M9903 Segmental and somatic dysfunction of lumbar region: Secondary | ICD-10-CM | POA: Diagnosis not present

## 2017-12-25 DIAGNOSIS — M9903 Segmental and somatic dysfunction of lumbar region: Secondary | ICD-10-CM | POA: Diagnosis not present

## 2017-12-25 DIAGNOSIS — S233XXA Sprain of ligaments of thoracic spine, initial encounter: Secondary | ICD-10-CM | POA: Diagnosis not present

## 2017-12-25 DIAGNOSIS — M9902 Segmental and somatic dysfunction of thoracic region: Secondary | ICD-10-CM | POA: Diagnosis not present

## 2017-12-25 DIAGNOSIS — S335XXA Sprain of ligaments of lumbar spine, initial encounter: Secondary | ICD-10-CM | POA: Diagnosis not present

## 2018-01-31 DIAGNOSIS — M9901 Segmental and somatic dysfunction of cervical region: Secondary | ICD-10-CM | POA: Diagnosis not present

## 2018-01-31 DIAGNOSIS — M62830 Muscle spasm of back: Secondary | ICD-10-CM | POA: Diagnosis not present

## 2018-01-31 DIAGNOSIS — M5137 Other intervertebral disc degeneration, lumbosacral region: Secondary | ICD-10-CM | POA: Diagnosis not present

## 2018-01-31 DIAGNOSIS — M9903 Segmental and somatic dysfunction of lumbar region: Secondary | ICD-10-CM | POA: Diagnosis not present

## 2018-02-01 DIAGNOSIS — M9903 Segmental and somatic dysfunction of lumbar region: Secondary | ICD-10-CM | POA: Diagnosis not present

## 2018-02-01 DIAGNOSIS — M9901 Segmental and somatic dysfunction of cervical region: Secondary | ICD-10-CM | POA: Diagnosis not present

## 2018-02-01 DIAGNOSIS — M62830 Muscle spasm of back: Secondary | ICD-10-CM | POA: Diagnosis not present

## 2018-02-01 DIAGNOSIS — M5137 Other intervertebral disc degeneration, lumbosacral region: Secondary | ICD-10-CM | POA: Diagnosis not present

## 2018-02-05 DIAGNOSIS — M9901 Segmental and somatic dysfunction of cervical region: Secondary | ICD-10-CM | POA: Diagnosis not present

## 2018-02-05 DIAGNOSIS — M62830 Muscle spasm of back: Secondary | ICD-10-CM | POA: Diagnosis not present

## 2018-02-05 DIAGNOSIS — M5137 Other intervertebral disc degeneration, lumbosacral region: Secondary | ICD-10-CM | POA: Diagnosis not present

## 2018-02-05 DIAGNOSIS — M9903 Segmental and somatic dysfunction of lumbar region: Secondary | ICD-10-CM | POA: Diagnosis not present

## 2018-03-13 ENCOUNTER — Ambulatory Visit
Admission: RE | Admit: 2018-03-13 | Discharge: 2018-03-13 | Disposition: A | Discharge status: Home / Self Care | Payer: Medicare Other | Source: Ambulatory Visit | Attending: Oncology | Admitting: Oncology

## 2018-03-13 DIAGNOSIS — C50411 Malignant neoplasm of upper-outer quadrant of right female breast: Secondary | ICD-10-CM | POA: Diagnosis not present

## 2018-03-13 DIAGNOSIS — Z170 Estrogen receptor positive status [ER+]: Secondary | ICD-10-CM | POA: Insufficient documentation

## 2018-03-13 DIAGNOSIS — M810 Age-related osteoporosis without current pathological fracture: Secondary | ICD-10-CM | POA: Diagnosis not present

## 2018-03-13 DIAGNOSIS — D509 Iron deficiency anemia, unspecified: Secondary | ICD-10-CM | POA: Insufficient documentation

## 2018-03-13 DIAGNOSIS — Z780 Asymptomatic menopausal state: Secondary | ICD-10-CM | POA: Insufficient documentation

## 2018-03-14 ENCOUNTER — Telehealth: Payer: Self-pay | Admitting: *Deleted

## 2018-03-14 NOTE — Telephone Encounter (Signed)
Called pt's home and spoke with husband.  I told him that her bone density test results were back and the osteoporosis is a little worse than it was. Dr. Janese Banks wanted to make appt moved up to go over the results.  He checked her calendar of appts and she is free 3/26 for 2 pm for labs and 2:30 for see md.

## 2018-03-19 ENCOUNTER — Other Ambulatory Visit: Payer: Self-pay

## 2018-03-19 DIAGNOSIS — C50419 Malignant neoplasm of upper-outer quadrant of unspecified female breast: Secondary | ICD-10-CM

## 2018-03-20 ENCOUNTER — Encounter: Payer: Self-pay | Admitting: Oncology

## 2018-03-20 ENCOUNTER — Inpatient Hospital Stay: Payer: Medicare Other | Attending: Oncology | Admitting: Oncology

## 2018-03-20 ENCOUNTER — Inpatient Hospital Stay: Payer: Medicare Other

## 2018-03-20 VITALS — BP 129/80 | HR 48 | Temp 97.9°F | Resp 18 | Ht 63.0 in | Wt 156.1 lb

## 2018-03-20 DIAGNOSIS — M810 Age-related osteoporosis without current pathological fracture: Secondary | ICD-10-CM

## 2018-03-20 DIAGNOSIS — Z170 Estrogen receptor positive status [ER+]: Secondary | ICD-10-CM | POA: Insufficient documentation

## 2018-03-20 DIAGNOSIS — Z79811 Long term (current) use of aromatase inhibitors: Secondary | ICD-10-CM | POA: Diagnosis not present

## 2018-03-20 DIAGNOSIS — C50411 Malignant neoplasm of upper-outer quadrant of right female breast: Secondary | ICD-10-CM | POA: Diagnosis not present

## 2018-03-20 DIAGNOSIS — C50419 Malignant neoplasm of upper-outer quadrant of unspecified female breast: Secondary | ICD-10-CM

## 2018-03-20 DIAGNOSIS — R110 Nausea: Secondary | ICD-10-CM | POA: Insufficient documentation

## 2018-03-20 DIAGNOSIS — N644 Mastodynia: Secondary | ICD-10-CM | POA: Insufficient documentation

## 2018-03-20 DIAGNOSIS — D509 Iron deficiency anemia, unspecified: Secondary | ICD-10-CM | POA: Diagnosis not present

## 2018-03-20 LAB — CBC WITH DIFFERENTIAL/PLATELET
BASOS PCT: 1 %
Basophils Absolute: 0 10*3/uL (ref 0–0.1)
EOS PCT: 1 %
Eosinophils Absolute: 0 10*3/uL (ref 0–0.7)
HCT: 37 % (ref 35.0–47.0)
Hemoglobin: 12.3 g/dL (ref 12.0–16.0)
Lymphocytes Relative: 37 %
Lymphs Abs: 1.5 10*3/uL (ref 1.0–3.6)
MCH: 26.6 pg (ref 26.0–34.0)
MCHC: 33.1 g/dL (ref 32.0–36.0)
MCV: 80.2 fL (ref 80.0–100.0)
MONO ABS: 0.3 10*3/uL (ref 0.2–0.9)
MONOS PCT: 8 %
Neutro Abs: 2.3 10*3/uL (ref 1.4–6.5)
Neutrophils Relative %: 53 %
Platelets: 224 10*3/uL (ref 150–440)
RBC: 4.62 MIL/uL (ref 3.80–5.20)
RDW: 16 % — AB (ref 11.5–14.5)
WBC: 4.2 10*3/uL (ref 3.6–11.0)

## 2018-03-20 LAB — IRON AND TIBC
IRON: 54 ug/dL (ref 28–170)
Saturation Ratios: 13 % (ref 10.4–31.8)
TIBC: 430 ug/dL (ref 250–450)
UIBC: 376 ug/dL

## 2018-03-20 LAB — FERRITIN: Ferritin: 21 ng/mL (ref 11–307)

## 2018-03-20 NOTE — Progress Notes (Signed)
No new changes noted today.  In 1 /2019 discomfort noted to left side of face/ glands with swellen/ Have appointment with ENT 03/27/18

## 2018-03-20 NOTE — Progress Notes (Signed)
Hematology/Oncology Consult note [facility]  [phone] [phone]  Patient Care Team: [name], MD as PCP - General (Family Medicine) Christene Lye, MD (General Surgery) [name], MD as Referring Physician (Family Medicine)   Name of the patient: Abigail Shaw  [id number]  01-29-1935   Date of visit: 03/20/18  Diagnosis- 1. Stage 1 breast cancer on aromasin 2. Iron deficiency anemia  Chief complaint/ Reason for visit- f/u of breast cancer and iron deficiency  Heme/Onc history: 1. 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 Tumor size 1.5 cm, grade 1, 2 sentinel nodes negative for metastasis. ER positive (80%), PR positive (90%). HER-2/neu negative by FISH (Her2/CEP17 ratio 1.15). Oncotype DX test reportedly had a score of 18. Started adjuvant hormonal therapy with Anastrazole in Aug 2014 changed to exemestane January 2015.  2. History of mild osteoporosis -on calcium + vitamin D. Fosamax stopped. She did not wish to pursue parenteral bisphosphonates  3. Iron deficiency- she was scheduled to go for colonoscopy but did not get through because of diarrhea.   Interval history- she has been feeling nauseous since 1 week. Unable to eat normally. Feels tired. No diarrhea. She also mentions discomfort in her right breast near the axilla which has been ongoing for a year. Feels that area might be a little larger   ECOG PS- 0 Pain scale- 0   Review of systems- Review of Systems  Constitutional: Negative for chills, fever, malaise/fatigue and weight loss.  HENT: Negative for congestion, ear discharge and nosebleeds.   Eyes: Negative for blurred vision.  Respiratory: Negative for cough, hemoptysis, sputum production, shortness of breath and wheezing.   Cardiovascular: Negative for  chest pain, palpitations, orthopnea and claudication.  Gastrointestinal: Negative for abdominal pain, blood in stool, constipation, diarrhea, heartburn, melena, nausea and vomiting.  Genitourinary: Negative for dysuria, flank pain, frequency, hematuria and urgency.  Musculoskeletal: Negative for back pain, joint pain and myalgias.  Skin: Negative for rash.  Neurological: Negative for dizziness, tingling, focal weakness, seizures, weakness and headaches.  Endo/Heme/Allergies: Does not bruise/bleed easily.  Psychiatric/Behavioral: Negative for depression and suicidal ideas. The patient does not have insomnia.       Allergies  Allergen Reactions  . Lisinopril Cough       . Simbrinza [Brinzolamide-Brimonidine]     lethargy  . Sulfamethoxazole-Trimethoprim     Other reaction(s): Other (See Comments) HYPONATREMIA     Past Medical History:  Diagnosis Date  . Breast cyst   . Cancer ([place]) 2014   right breast cancer  . Depression   . GERD (gastroesophageal reflux disease)   . Glaucoma   . Headache    migraines  . Hypercholesteremia   . Hypertension   . IBS (irritable bowel syndrome)   . Joint pain   . Motion sickness    ferry, cars  . Wears dentures    partial upper (1 tooth)     Past Surgical History:  Procedure Laterality Date  . ABDOMINAL HYSTERECTOMY  1976  . BREAST LUMPECTOMY Right 2014   + mammo cite rad and aromasin  . BREAST MAMMOSITE Right 08-01-13  . BREAST SURGERY Right 07-02-2013   with sentinal biopsy  . BREAST SURGERY Right 07-18-13  . CATARACT EXTRACTION W/ INTRAOCULAR LENS  IMPLANT, BILATERAL    . COLONOSCOPY     Dr Candace Cruise  . FOOT SURGERY  Left 12/05/14  . MM BREAST STEREO BX*R*R/S Right 2014  . PHOTOCOAGULATION WITH LASER Left 08/22/2016   Procedure: PHOTOCOAGULATION WITH LASER;  Surgeon: Ronnell Freshwater, MD;  Location: [facility];  Service: Ophthalmology;  Laterality: Left;  KEEP PT 2ND    Social History   Socioeconomic History  .  Marital status: Married    Spouse name: Not on file  . Number of children: 5  . Years of education: 83  . Highest education level: Not on file  Occupational History  . Occupation: Retired  Scientific laboratory technician  . Financial resource strain: Not on file  . Food insecurity:    Worry: Not on file    Inability: Not on file  . Transportation needs:    Medical: Not on file    Non-medical: Not on file  Tobacco Use  . Smoking status: Never Smoker  . Smokeless tobacco: Never Used  Substance and Sexual Activity  . Alcohol use: No  . Drug use: No  . Sexual activity: Not on file  Lifestyle  . Physical activity:    Days per week: Not on file    Minutes per session: Not on file  . Stress: Not on file  Relationships  . Social connections:    Talks on phone: Not on file    Gets together: Not on file    Attends religious service: Not on file    Active member of club or organization: Not on file    Attends meetings of clubs or organizations: Not on file    Relationship status: Not on file  . Intimate partner violence:    Fear of current or ex partner: Not on file    Emotionally abused: Not on file    Physically abused: Not on file    Forced sexual activity: Not on file  Other Topics Concern  . Not on file  Social History Narrative   Lives with husband   Caffeine use:  none    Family History  Problem Relation Age of Onset  . Cancer Mother        age 50 breast  . Cancer Brother        prostate/lung/ liver /spleen  . Breast cancer Maternal Aunt        age 48  . Colon cancer Paternal Aunt      Current Outpatient Medications:  .  amLODipine (NORVASC) 2.5 MG tablet, Take 2.5 mg by mouth daily., Disp: , Rfl:  .  aspirin 81 MG tablet, Take 81 mg by mouth every evening. , Disp: , Rfl:  .  atropine 1 % ophthalmic solution, Apply to eye., Disp: , Rfl:  .  bimatoprost (LUMIGAN) 0.03 % ophthalmic solution, 1 drop at bedtime., Disp: , Rfl:  .  Biotin 1000 MCG tablet, Take 1,000 mcg by mouth  daily., Disp: , Rfl:  .  cetirizine (ZYRTEC) 10 MG tablet, Take 10 mg by mouth every evening. , Disp: , Rfl:  .  dorzolamide-timolol (COSOPT) 22.3-6.8 MG/ML ophthalmic solution, Place 1 drop into both eyes 2 (two) times daily. , Disp: , Rfl:  .  exemestane (AROMASIN) 25 MG tablet, Take 1 tablet (25 mg total) by mouth daily., Disp: 90 tablet, Rfl: 0 .  FLUoxetine (PROZAC) 20 MG capsule, Take 20 mg by mouth daily. , Disp: , Rfl:  .  fluticasone (FLONASE) 50 MCG/ACT nasal spray, Place 2 sprays into both nostrils daily. , Disp: , Rfl:  .  LORazepam (ATIVAN) 0.5 MG tablet, Take 0.5 mg  by mouth at bedtime. , Disp: , Rfl:  .  losartan (COZAAR) 50 MG tablet, Take 50 mg by mouth every evening. , Disp: , Rfl:  .  RABEprazole (ACIPHEX) 20 MG tablet, TAKE 1 TABLET (20 MG TOTAL) BY MOUTH ONCE DAILY., Disp: , Rfl:  .  rizatriptan (MAXALT) 10 MG tablet, Take 10 mg by mouth as needed for migraine. May repeat in 2 hours if needed, Disp: , Rfl:  .  rosuvastatin (CRESTOR) 5 MG tablet, Take 5 mg by mouth every other day. , Disp: , Rfl:  .  Travoprost, BAK Free, (TRAVATAN) 0.004 % SOLN ophthalmic solution, Place 1 drop into both eyes at bedtime., Disp: , Rfl:  .  Vitamin D, Ergocalciferol, (DRISDOL) 50000 UNITS CAPS capsule, Take 1 capsule by mouth once a week., Disp: , Rfl:   Physical exam:  Vitals:   03/20/18 1435  BP: 129/80  Pulse: (!) 48  Resp: 18  Temp: 97.9 F (36.6 C)  TempSrc: Tympanic  Weight: 156 lb 1.6 oz (70.8 kg)  Height: 5' 3" (1.6 m)   Physical Exam  Constitutional: She is oriented to person, place, and time and well-developed, well-nourished, and in no distress.  HENT:  Head: Normocephalic and atraumatic.  Eyes: Pupils are equal, round, and reactive to light. EOM are normal.  Neck: Normal range of motion.  Cardiovascular: Normal rate, regular rhythm and normal heart sounds.  Pulmonary/Chest: Effort normal and breath sounds normal.  Abdominal: Soft. Bowel sounds are normal.    Neurological: She is alert and oriented to person, place, and time.  Skin: Skin is warm and dry.    Palpable firm area over right breast 11 o clock position close to axilla. This does not feel like a breast mass. No palpable breast masses in b/l breasts or bilateral adenopathy  CMP Latest Ref Rng & Units 10/19/2016  Glucose 65 - 99 mg/dL 99  BUN 6 - 20 mg/dL 11  Creatinine 0.44 - 1.00 mg/dL 0.73  Sodium 135 - 145 mmol/L 136  Potassium 3.5 - 5.1 mmol/L 3.9  Chloride 101 - 111 mmol/L 104  CO2 22 - 32 mmol/L 26  Calcium 8.9 - 10.3 mg/dL 9.9  Total Protein 6.5 - 8.1 g/dL 7.3  Total Bilirubin 0.3 - 1.2 mg/dL 0.3  Alkaline Phos 38 - 126 U/L 87  AST 15 - 41 U/L 26  ALT 14 - 54 U/L 18   CBC Latest Ref Rng & Units 10/19/2017  WBC 3.6 - 11.0 K/uL 4.0  Hemoglobin 12.0 - 16.0 g/dL 11.7(L)  Hematocrit 35.0 - 47.0 % 35.4  Platelets 150 - 440 K/uL 198    No images are attached to the encounter.  Dg Bone Density  Result Date: 03/13/2018 EXAM: DUAL X-RAY ABSORPTIOMETRY (DXA) FOR BONE MINERAL DENSITY IMPRESSION: Dear Dr. Janese Banks, Your patient Abigail Shaw completed a BMD test on 03/13/2018 using the [place] (analysis version: 14.10) manufactured by [employer]. The following summarizes the results of our evaluation. PATIENT BIOGRAPHICAL: Name: Alishia, Lebo Patient ID: [id number] Birth Date: [birth year] Height: 63.0 in. Gender: Female Exam Date: 03/13/2018 Weight: 160.1 lbs. Indications: Advanced Age, Caucasian, History of Breast Cancer, History of Fracture (Adult), Hysterectomy, Oophorectomy Bilateral, Postmenopausal Fractures: Left foot, Right forearm Treatments: Vitamin D ASSESSMENT: The BMD measured at AP Spine L1-L2 is 0.774 g/cm2 with a T-score of -3.3. This patient is considered osteoporotic according to [facility]) criteria. L-3 & 4 was excluded due to degenerative changes. Site Region Measured Measured WHO  Young Adult BMD Date       Age      Classification  T-score AP Spine L1-L2 03/13/2018 80.5 Osteoporosis -3.3 0.774 g/cm2 AP Spine L1-L2 03/09/2017 79.5 Osteoporosis -2.7 0.845 g/cm2 DualFemur Neck Right 03/13/2018 80.5 Osteopenia -2.2 0.727 g/cm2 DualFemur Neck Right 03/09/2017 79.5 Osteopenia -2.2 0.735 g/cm2 DualFemur Total Mean 03/13/2018 80.5 Osteopenia -2.0 0.755 g/cm2 DualFemur Total Mean 03/09/2017 79.5 Osteopenia -1.9 0.773 g/cm2 World Health Organization [facility]) criteria for post-menopausal, Caucasian Women: Normal:       T-score at or above -1 SD Osteopenia:   T-score between -1 and -2.5 SD Osteoporosis: T-score at or below -2.5 SD RECOMMENDATIONS: 1. All patients should optimize calcium and vitamin D intake. 2. Consider FDA-approved medical therapies in postmenopausal women and men aged 81 years and older, based on the following: a. A hip or vertebral(clinical or morphometric) fracture b. T-score < -2.5 at the femoral neck or spine after appropriate evaluation to exclude secondary causes c. Low bone mass (T-score between -1.0 and -2.5 at the femoral neck or spine) and a 10-year probability of a hip fracture > 3% or a 10-year probability of a major osteoporosis-related fracture > 20% based on the [place]-adapted WHO algorithm d. Clinician judgment and/or patient preferences may indicate treatment for people with 10-year fracture probabilities above or below these levels FOLLOW-UP: People with diagnosed cases of osteoporosis or at high risk for fracture should have regular bone mineral density tests. For patients eligible for Medicare, routine testing is allowed once every 2 years. The testing frequency can be increased to one year for patients who have rapidly progressing disease, those who are receiving or discontinuing medical therapy to restore bone mass, or have additional risk factors. I have reviewed this report, and agree with the above findings. [facility] Electronically Signed   By: Lowella Grip III M.D.   On: 03/13/2018 11:26      Assessment and plan- Patient is a 83 y.o. female who sees me for following medical issues:  1. Stage I right breast cancer- continue aromasin calcium and Vit D until august 2019. Mammograms being ordered through surgery. She does have a palpable firm area over right breast 11 o clock position close to axilla. This was found to be fibroglandular tissue on prior USG. Patient feels this is getting bigger and is painful. I will therefor send her to Dr. Bary Castilla for further assessment  2. Osteoporosis: Fosamax was stopped as she had been a long time and was now developing iron deficiency anemia and reflux issues. She did not want to proceed with pa bisphosphonates. Bone denisty scan done this year was mildly worse than last year. She will be coming off aromasin in august. Repeat bone denisty scan next year and if T score is worse- will discuss IV bisphosphonates   3. Iron deficiency anemia: Patient could not go for colonoscopy because of diarrhea from colon prep. Hb improved to 12.3. Iron studies are normal.  I will repeat CBC ferritin and iron studies in 6 months time and see her thereafter  4. Nausea/ vomiting- possibly viral. I have asked her to speak to Dr. Kary Kos if symptoms do not iprove over next few days    Visit Diagnosis 1. Malignant neoplasm of upper-outer quadrant of right breast in female, estrogen receptor positive ([place])   2. Iron deficiency anemia, unspecified iron deficiency anemia type   3. Osteoporosis without current pathological fracture, unspecified osteoporosis type      Dr. [name], MD, MPH [place] at  [facility] Pager- [phone] 03/20/2018

## 2018-04-04 ENCOUNTER — Other Ambulatory Visit: Payer: Self-pay

## 2018-04-04 ENCOUNTER — Ambulatory Visit: Payer: Medicare Other | Admitting: Anesthesiology

## 2018-04-04 ENCOUNTER — Encounter
Admission: RE | Disposition: A | Discharge status: Home / Self Care | Payer: Self-pay | Source: Ambulatory Visit | Attending: Internal Medicine

## 2018-04-04 ENCOUNTER — Encounter: Payer: Self-pay | Admitting: *Deleted

## 2018-04-04 ENCOUNTER — Ambulatory Visit
Admission: RE | Admit: 2018-04-04 | Discharge: 2018-04-04 | Disposition: A | Discharge status: Home / Self Care | Payer: Medicare Other | Source: Ambulatory Visit | Attending: Internal Medicine | Admitting: Internal Medicine

## 2018-04-04 DIAGNOSIS — F329 Major depressive disorder, single episode, unspecified: Secondary | ICD-10-CM | POA: Diagnosis not present

## 2018-04-04 DIAGNOSIS — Z853 Personal history of malignant neoplasm of breast: Secondary | ICD-10-CM | POA: Diagnosis not present

## 2018-04-04 DIAGNOSIS — G43909 Migraine, unspecified, not intractable, without status migrainosus: Secondary | ICD-10-CM | POA: Insufficient documentation

## 2018-04-04 DIAGNOSIS — R110 Nausea: Secondary | ICD-10-CM | POA: Diagnosis not present

## 2018-04-04 DIAGNOSIS — Z6827 Body mass index (BMI) 27.0-27.9, adult: Secondary | ICD-10-CM | POA: Diagnosis not present

## 2018-04-04 DIAGNOSIS — R634 Abnormal weight loss: Secondary | ICD-10-CM | POA: Insufficient documentation

## 2018-04-04 DIAGNOSIS — K228 Other specified diseases of esophagus: Secondary | ICD-10-CM | POA: Diagnosis not present

## 2018-04-04 DIAGNOSIS — Z7982 Long term (current) use of aspirin: Secondary | ICD-10-CM | POA: Insufficient documentation

## 2018-04-04 DIAGNOSIS — H409 Unspecified glaucoma: Secondary | ICD-10-CM | POA: Diagnosis not present

## 2018-04-04 DIAGNOSIS — Z79811 Long term (current) use of aromatase inhibitors: Secondary | ICD-10-CM | POA: Insufficient documentation

## 2018-04-04 DIAGNOSIS — F419 Anxiety disorder, unspecified: Secondary | ICD-10-CM | POA: Diagnosis not present

## 2018-04-04 DIAGNOSIS — K449 Diaphragmatic hernia without obstruction or gangrene: Secondary | ICD-10-CM | POA: Insufficient documentation

## 2018-04-04 DIAGNOSIS — K219 Gastro-esophageal reflux disease without esophagitis: Secondary | ICD-10-CM | POA: Insufficient documentation

## 2018-04-04 DIAGNOSIS — R1310 Dysphagia, unspecified: Secondary | ICD-10-CM | POA: Insufficient documentation

## 2018-04-04 DIAGNOSIS — I10 Essential (primary) hypertension: Secondary | ICD-10-CM | POA: Diagnosis not present

## 2018-04-04 DIAGNOSIS — Z79899 Other long term (current) drug therapy: Secondary | ICD-10-CM | POA: Diagnosis not present

## 2018-04-04 DIAGNOSIS — E7800 Pure hypercholesterolemia, unspecified: Secondary | ICD-10-CM | POA: Diagnosis not present

## 2018-04-04 DIAGNOSIS — Z7951 Long term (current) use of inhaled steroids: Secondary | ICD-10-CM | POA: Insufficient documentation

## 2018-04-04 DIAGNOSIS — K225 Diverticulum of esophagus, acquired: Secondary | ICD-10-CM | POA: Insufficient documentation

## 2018-04-04 HISTORY — PX: ESOPHAGOGASTRODUODENOSCOPY (EGD) WITH PROPOFOL: SHX5813

## 2018-04-04 SURGERY — ESOPHAGOGASTRODUODENOSCOPY (EGD) WITH PROPOFOL
Anesthesia: General

## 2018-04-04 MED ORDER — LIDOCAINE HCL (CARDIAC) 20 MG/ML IV SOLN
INTRAVENOUS | Status: DC | PRN
  Administered 2018-04-04: 60 mg via INTRAVENOUS

## 2018-04-04 MED ORDER — GLYCOPYRROLATE 0.2 MG/ML IJ SOLN
INTRAMUSCULAR | Status: DC | PRN
  Administered 2018-04-04: 0.2 mg via INTRAVENOUS

## 2018-04-04 MED ORDER — GLYCOPYRROLATE 0.2 MG/ML IJ SOLN
INTRAMUSCULAR | Status: AC
  Filled 2018-04-04: qty 1

## 2018-04-04 MED ORDER — ONDANSETRON HCL 4 MG/2ML IJ SOLN
INTRAMUSCULAR | Status: DC | PRN
  Administered 2018-04-04: 4 mg via INTRAVENOUS

## 2018-04-04 MED ORDER — PROPOFOL 10 MG/ML IV BOLUS
INTRAVENOUS | Status: DC | PRN
  Administered 2018-04-04: 40 mg via INTRAVENOUS
  Administered 2018-04-04: 30 mg via INTRAVENOUS

## 2018-04-04 MED ORDER — SODIUM CHLORIDE 0.9 % IV SOLN
INTRAVENOUS | Status: DC
  Administered 2018-04-04: 14:00:00 via INTRAVENOUS

## 2018-04-04 MED ORDER — ONDANSETRON HCL 4 MG/2ML IJ SOLN
INTRAMUSCULAR | Status: AC
  Filled 2018-04-04: qty 2

## 2018-04-04 MED ORDER — PROPOFOL 10 MG/ML IV BOLUS
INTRAVENOUS | Status: AC
  Filled 2018-04-04: qty 20

## 2018-04-04 NOTE — H&P (Signed)
Outpatient short stay form Pre-procedure 04/04/2018 1:20 PM Teodoro K. Alice Reichert, M.D.  Primary Physician: [name] MD  Reason for visit: Dysphagia, nausea  History of present illness: 83 year old patient complains of intermittent dysphagia and progressive nausea leading to symptoms of anorexia.  Patient has tried rabeprazole and a H2 blocker without much relief.  Patient has an unquantified amount of weight loss.    Current Facility-Administered Medications:  .  0.9 %  sodium chloride infusion, , Intravenous, Continuous, [place], Benay Pike, MD  Medications Prior to Admission  Medication Sig Dispense Refill Last Dose  . amLODipine (NORVASC) 2.5 MG tablet Take 2.5 mg by mouth daily.   04/04/2018 at 0500  . aspirin 81 MG tablet Take 81 mg by mouth every evening.    Past Week at Unknown time  . Biotin 1000 MCG tablet Take 1,000 mcg by mouth daily.   04/03/2018 at 1000  . cetirizine (ZYRTEC) 10 MG tablet Take 10 mg by mouth every evening.    04/03/2018 at 1000  . dorzolamide-timolol (COSOPT) 22.3-6.8 MG/ML ophthalmic solution Place 1 drop into both eyes 2 (two) times daily.    04/03/2018 at 1000  . exemestane (AROMASIN) 25 MG tablet Take 1 tablet (25 mg total) by mouth daily. 90 tablet 0 04/03/2018 at 1000  . FLUoxetine (PROZAC) 20 MG capsule Take 20 mg by mouth daily.    04/03/2018 at 1000  . fluticasone (FLONASE) 50 MCG/ACT nasal spray Place 2 sprays into both nostrils daily.    04/03/2018 at 1000  . LORazepam (ATIVAN) 0.5 MG tablet Take 0.5 mg by mouth at bedtime.    04/03/2018 at 1600  . losartan (COZAAR) 50 MG tablet Take 50 mg by mouth every evening.    04/03/2018 at 1600  . RABEprazole (ACIPHEX) 20 MG tablet TAKE 1 TABLET (20 MG TOTAL) BY MOUTH ONCE DAILY.   04/03/2018 at 1000  . rizatriptan (MAXALT) 10 MG tablet Take 10 mg by mouth as needed for migraine. May repeat in 2 hours if needed   Past Month at Unknown time  . rosuvastatin (CRESTOR) 5 MG tablet Take 5 mg by mouth every other day.    Past Week  at Unknown time  . Travoprost, BAK Free, (TRAVATAN) 0.004 % SOLN ophthalmic solution Place 1 drop into both eyes at bedtime.   04/03/2018 at 1000  . Vitamin D, Ergocalciferol, (DRISDOL) 50000 UNITS CAPS capsule Take 1 capsule by mouth once a week.   Past Week at Unknown time  . atropine 1 % ophthalmic solution Apply to eye.   Not Taking at Unknown time  . bimatoprost (LUMIGAN) 0.03 % ophthalmic solution 1 drop at bedtime.   Not Taking at Unknown time     Allergies  Allergen Reactions  . Lisinopril Cough       . Simbrinza [Brinzolamide-Brimonidine]     lethargy  . Sulfamethoxazole-Trimethoprim     Other reaction(s): Other (See Comments) HYPONATREMIA     Past Medical History:  Diagnosis Date  . Breast cyst   . Cancer ([place]) 2014   right breast cancer  . Depression   . GERD (gastroesophageal reflux disease)   . Glaucoma   . Headache    migraines  . Hypercholesteremia   . Hypertension   . IBS (irritable bowel syndrome)   . Joint pain   . Motion sickness    ferry, cars  . Wears dentures    partial upper (1 tooth)    Review of systems:   Otherwise negative.   Physical  Exam  Gen: Alert, oriented. Appears stated age.  HEENT: [place]/AT. PERRLA. Lungs: CTA, no wheezes. CV: RR nl S1, S2. Abd: soft, benign, no masses. BS+ Ext: No edema. Pulses 2+    Planned procedures: Proceed with the EGD. The patient understands the nature of the planned procedure, indications, risks, alternatives and potential complications including but not limited to bleeding, infection, perforation, damage to internal organs and possible oversedation/side effects from anesthesia. The patient agrees and gives consent to proceed.  Please refer to procedure notes for findings, recommendations and patient disposition/instructions.    Teodoro K. Alice Reichert, M.D. Gastroenterology 04/04/2018  1:20 PM

## 2018-04-04 NOTE — Anesthesia Preprocedure Evaluation (Signed)
Anesthesia Evaluation  Patient identified by MRN, date of birth, ID band Patient awake  General Assessment Comment:Pt states "couldn't wake up, don't remember anything for a day". States had bruising under jaw also. (Eye surgery) Discussed jaw thrust w airway obstruction and possible versed use.   Reviewed: Allergy & Precautions, H&P , NPO status , reviewed documented beta blocker date and time   History of Anesthesia Complications (+) DIFFICULT AIRWAY, PROLONGED EMERGENCE and history of anesthetic complications  Airway Mallampati: III  TM Distance: >3 FB Neck ROM: full    Dental  (+) Partial Upper   Pulmonary    Pulmonary exam normal        Cardiovascular hypertension, Normal cardiovascular exam     Neuro/Psych  Headaches, PSYCHIATRIC DISORDERS Anxiety Depression    GI/Hepatic GERD  ,  Endo/Other    Renal/GU      Musculoskeletal   Abdominal   Peds  Hematology  (+) anemia ,   Anesthesia Other Findings . Breast cyst  . Cancer ([place]) 2014  right breast cancer . Depression  . GERD (gastroesophageal reflux disease)  . Glaucoma  . Headache   migraines . Hypercholesteremia  . Hypertension  . IBS (irritable bowel syndrome)  . Joint pain  . Motion sickness   ferry, cars . Wears dentures   partial upper (1 tooth)  Reproductive/Obstetrics                             Anesthesia Physical Anesthesia Plan  ASA: II  Anesthesia Plan: General   Post-op Pain Management:    Induction:   PONV Risk Score and Plan: 3 and Propofol infusion  Airway Management Planned:   Additional Equipment:   Intra-op Plan:   Post-operative Plan:   Informed Consent: I have reviewed the patients History and Physical, chart, labs and discussed the procedure including the risks, benefits and alternatives for the proposed anesthesia with the patient or authorized representative who has  indicated his/her understanding and acceptance.   Dental Advisory Given  Plan Discussed with: CRNA  Anesthesia Plan Comments:         Anesthesia Quick Evaluation

## 2018-04-04 NOTE — Interval H&P Note (Signed)
History and Physical Interval Note:  04/04/2018 1:22 PM  Abigail Shaw  has presented today for surgery, with the diagnosis of DYSPHAGIA  The various methods of treatment have been discussed with the patient and family. After consideration of risks, benefits and other options for treatment, the patient has consented to  Procedure(s): ESOPHAGOGASTRODUODENOSCOPY (EGD) WITH PROPOFOL (N/A) as a surgical intervention .  The patient's history has been reviewed, patient examined, no change in status, stable for surgery.  I have reviewed the patient's chart and labs.  Questions were answered to the patient's satisfaction.     [place], [place]

## 2018-04-04 NOTE — Op Note (Signed)
[facility] Gastroenterology Patient Name: Abigail Shaw Procedure Date: 04/04/2018 2:10 PM MRN: [id number] Account #: [id number] Date of Birth: 06-26-1935 Admit Type: Outpatient Age: 83 Room: [facility] ENDO ROOM 4 Gender: Female Note Status: Finalized Procedure:            Upper GI endoscopy Indications:          Dysphagia, Nausea Providers:            Benay Pike. Alice Reichert MD, MD Referring MD:         Irven Easterly. Kary Kos, MD (Referring MD) Medicines:            Propofol per Anesthesia Complications:        No immediate complications. Procedure:            Pre-Anesthesia Assessment:                       - The risks and benefits of the procedure and the                        sedation options and risks were discussed with the                        patient. All questions were answered and informed                        consent was obtained.                       - Patient identification and proposed procedure were                        verified prior to the procedure by the nurse. The                        procedure was verified in the procedure room.                       - ASA Grade Assessment: II - A patient with mild                        systemic disease.                       - After reviewing the risks and benefits, the patient                        was deemed in satisfactory condition to undergo the                        procedure.                       After obtaining informed consent, the endoscope was                        passed under direct vision. Throughout the procedure,                        the patient's blood pressure, pulse, and oxygen  saturations were monitored continuously. The Endoscope                        was introduced through the mouth, and advanced to the                        third part of duodenum. The upper GI endoscopy was                        accomplished without difficulty. The patient tolerated                  the procedure well. Findings:      A non-bleeding diverticulum with a small opening and no stigmata of       recent bleeding was found in the middle third of the esophagus.      The examined esophagus was moderately tortuous.      There is no endoscopic evidence of stenosis, stricture or mass in the       distal esophagus.      A 2 cm hiatal hernia was present.      The exam was otherwise without abnormality.      The examined duodenum was normal. 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                       - Tortuous esophagus.                       - 2 cm hiatal hernia.                       - The examination was otherwise normal.                       - Normal examined duodenum.                       - No specimens collected. Recommendation:       - Patient has a contact number available for                        emergencies. The signs and symptoms of potential                        delayed complications were discussed with the patient.                        Return to normal activities tomorrow. Written discharge                        instructions were provided to the patient.                       - Resume previous diet.                       - Continue present medications.                       - No repeat upper endoscopy.                       -  Return to nurse practitioner in 3 months. Procedure Code(s):    --- Professional ---                       [id number], Esophagogastroduodenoscopy, flexible, transoral;                        diagnostic, including collection of specimen(s) by                        brushing or washing, when performed (separate procedure) Diagnosis Code(s):    --- Professional ---                       R11.0, Nausea                       R13.10, Dysphagia, unspecified                       K44.9, Diaphragmatic hernia without obstruction or                        gangrene                       Q39.9,  Congenital malformation of esophagus, unspecified                       Q39.6, Congenital diverticulum of esophagus CPT copyright 2017 American Medical Association. All rights reserved. The codes documented in this report are preliminary and upon coder review may  be revised to meet current compliance requirements. Efrain Sella MD, MD 04/04/2018 2:22:20 PM This report has been signed electronically. Number of Addenda: 0 Note Initiated On: 04/04/2018 2:10 PM      [facility]

## 2018-04-04 NOTE — Anesthesia Post-op Follow-up Note (Signed)
Anesthesia QCDR form completed.        

## 2018-04-04 NOTE — Transfer of Care (Signed)
Immediate Anesthesia Transfer of Care Note  Patient: Abigail Shaw  Procedure(s) Performed: ESOPHAGOGASTRODUODENOSCOPY (EGD) WITH PROPOFOL (N/A )  Patient Location: Endoscopy Unit  Anesthesia Type:General  Level of Consciousness: drowsy and patient cooperative  Airway & Oxygen Therapy: Patient Spontanous Breathing and Patient connected to nasal cannula oxygen  Post-op Assessment: Report given to RN, Post -op Vital signs reviewed and stable and Patient moving all extremities X 4  Post vital signs: Reviewed and stable  Last Vitals:  Vitals Value Taken Time  BP    Temp    Pulse 59 04/04/2018  2:28 PM  Resp 13 04/04/2018  2:28 PM  SpO2 100 % 04/04/2018  2:28 PM  Vitals shown include unvalidated device data.  Last Pain:  Vitals:   04/04/18 1429  TempSrc: (P) Tympanic  PainSc:          Complications: No apparent anesthesia complications

## 2018-04-05 ENCOUNTER — Encounter: Payer: Self-pay | Admitting: Internal Medicine

## 2018-04-16 ENCOUNTER — Other Ambulatory Visit: Payer: Self-pay | Admitting: Gastroenterology

## 2018-04-16 DIAGNOSIS — R1011 Right upper quadrant pain: Secondary | ICD-10-CM

## 2018-04-16 DIAGNOSIS — R110 Nausea: Secondary | ICD-10-CM

## 2018-04-19 ENCOUNTER — Ambulatory Visit: Payer: Medicare Other | Admitting: Oncology

## 2018-04-19 ENCOUNTER — Other Ambulatory Visit: Payer: Medicare Other

## 2018-04-24 NOTE — Anesthesia Postprocedure Evaluation (Signed)
Anesthesia Post Note  Patient: Abigail Shaw  Procedure(s) Performed: ESOPHAGOGASTRODUODENOSCOPY (EGD) WITH PROPOFOL (N/A )  Patient location during evaluation: Endoscopy Anesthesia Type: General Level of consciousness: awake and alert Pain management: pain level controlled Vital Signs Assessment: post-procedure vital signs reviewed and stable Respiratory status: spontaneous breathing, nonlabored ventilation and respiratory function stable Cardiovascular status: blood pressure returned to baseline and stable Postop Assessment: no apparent nausea or vomiting Anesthetic complications: no     Last Vitals:  Vitals:   04/04/18 1429 04/04/18 1449  BP: 119/60 (!) 159/75  Pulse:    Resp:    Temp: (!) 35.9 C   SpO2:      Last Pain:  Vitals:   04/05/18 0813  TempSrc:   PainSc: 0-No pain                 Alphonsus Sias

## 2018-04-25 ENCOUNTER — Ambulatory Visit
Admission: RE | Admit: 2018-04-25 | Discharge: 2018-04-25 | Disposition: A | Discharge status: Home / Self Care | Payer: Medicare Other | Source: Ambulatory Visit | Attending: Gastroenterology | Admitting: Gastroenterology

## 2018-04-25 ENCOUNTER — Encounter
Admission: RE | Admit: 2018-04-25 | Discharge: 2018-04-25 | Disposition: A | Discharge status: Home / Self Care | Payer: Medicare Other | Source: Ambulatory Visit | Attending: Gastroenterology | Admitting: Gastroenterology

## 2018-04-25 DIAGNOSIS — R110 Nausea: Secondary | ICD-10-CM | POA: Diagnosis present

## 2018-04-25 DIAGNOSIS — R1011 Right upper quadrant pain: Secondary | ICD-10-CM | POA: Insufficient documentation

## 2018-04-25 MED ORDER — TECHNETIUM TC 99M MEBROFENIN IV KIT
5.1400 | PACK | Freq: Once | INTRAVENOUS | Status: AC | PRN
  Administered 2018-04-25: 5.14 via INTRAVENOUS

## 2018-05-10 ENCOUNTER — Other Ambulatory Visit: Payer: Self-pay

## 2018-05-10 NOTE — Discharge Instructions (Signed)
General Anesthesia, Adult, Care After °These instructions provide you with information about caring for yourself after your procedure. Your health care provider may also give you more specific instructions. Your treatment has been planned according to current medical practices, but problems sometimes occur. Call your health care provider if you have any problems or questions after your procedure. °What can I expect after the procedure? °After the procedure, it is common to have: °· Vomiting. °· A sore throat. °· Mental slowness. ° °It is common to feel: °· Nauseous. °· Cold or shivery. °· Sleepy. °· Tired. °· Sore or achy, even in parts of your body where you did not have surgery. ° °Follow these instructions at home: °For at least 24 hours after the procedure: °· Do not: °? Participate in activities where you could fall or become injured. °? Drive. °? Use heavy machinery. °? Drink alcohol. °? Take sleeping pills or medicines that cause drowsiness. °? Make important decisions or sign legal documents. °? Take care of children on your own. °· Rest. °Eating and drinking °· If you vomit, drink water, juice, or soup when you can drink without vomiting. °· Drink enough fluid to keep your urine clear or pale yellow. °· Make sure you have little or no nausea before eating solid foods. °· Follow the diet recommended by your health care provider. °General instructions °· Have a responsible adult stay with you until you are awake and alert. °· Return to your normal activities as told by your health care provider. Ask your health care provider what activities are safe for you. °· Take over-the-counter and prescription medicines only as told by your health care provider. °· If you smoke, do not smoke without supervision. °· Keep all follow-up visits as told by your health care provider. This is important. °Contact a health care provider if: °· You continue to have nausea or vomiting at home, and medicines are not helpful. °· You  cannot drink fluids or start eating again. °· You cannot urinate after 8-12 hours. °· You develop a skin rash. °· You have fever. °· You have increasing redness at the site of your procedure. °Get help right away if: °· You have difficulty breathing. °· You have chest pain. °· You have unexpected bleeding. °· You feel that you are having a life-threatening or urgent problem. °This information is not intended to replace advice given to you by your health care provider. Make sure you discuss any questions you have with your health care provider. °Document Released: 03/20/2001 Document Revised: 05/16/2016 Document Reviewed: 11/26/2015 °Elsevier Interactive Patient Education © 2018 [employer]. ° °

## 2018-05-16 ENCOUNTER — Encounter
Admission: RE | Disposition: A | Discharge status: Home / Self Care | Payer: Self-pay | Source: Ambulatory Visit | Attending: Ophthalmology

## 2018-05-16 ENCOUNTER — Ambulatory Visit: Payer: Medicare Other | Admitting: Anesthesiology

## 2018-05-16 ENCOUNTER — Ambulatory Visit
Admission: RE | Admit: 2018-05-16 | Discharge: 2018-05-16 | Disposition: A | Discharge status: Home / Self Care | Payer: Medicare Other | Source: Ambulatory Visit | Attending: Ophthalmology | Admitting: Ophthalmology

## 2018-05-16 DIAGNOSIS — I10 Essential (primary) hypertension: Secondary | ICD-10-CM | POA: Insufficient documentation

## 2018-05-16 DIAGNOSIS — Z853 Personal history of malignant neoplasm of breast: Secondary | ICD-10-CM | POA: Diagnosis not present

## 2018-05-16 DIAGNOSIS — M810 Age-related osteoporosis without current pathological fracture: Secondary | ICD-10-CM | POA: Diagnosis not present

## 2018-05-16 DIAGNOSIS — F329 Major depressive disorder, single episode, unspecified: Secondary | ICD-10-CM | POA: Insufficient documentation

## 2018-05-16 DIAGNOSIS — H401123 Primary open-angle glaucoma, left eye, severe stage: Secondary | ICD-10-CM | POA: Insufficient documentation

## 2018-05-16 DIAGNOSIS — Z888 Allergy status to other drugs, medicaments and biological substances status: Secondary | ICD-10-CM | POA: Insufficient documentation

## 2018-05-16 DIAGNOSIS — M1990 Unspecified osteoarthritis, unspecified site: Secondary | ICD-10-CM | POA: Insufficient documentation

## 2018-05-16 DIAGNOSIS — K589 Irritable bowel syndrome without diarrhea: Secondary | ICD-10-CM | POA: Insufficient documentation

## 2018-05-16 DIAGNOSIS — E7800 Pure hypercholesterolemia, unspecified: Secondary | ICD-10-CM | POA: Diagnosis not present

## 2018-05-16 DIAGNOSIS — Z955 Presence of coronary angioplasty implant and graft: Secondary | ICD-10-CM | POA: Insufficient documentation

## 2018-05-16 DIAGNOSIS — K219 Gastro-esophageal reflux disease without esophagitis: Secondary | ICD-10-CM | POA: Diagnosis not present

## 2018-05-16 HISTORY — DX: Age-related osteoporosis without current pathological fracture: M81.0

## 2018-05-16 HISTORY — DX: Other complications of anesthesia, initial encounter: T88.59XA

## 2018-05-16 HISTORY — DX: Adverse effect of unspecified anesthetic, initial encounter: T41.45XA

## 2018-05-16 HISTORY — DX: Unspecified osteoarthritis, unspecified site: M19.90

## 2018-05-16 HISTORY — PX: PHOTOCOAGULATION: SHX5303

## 2018-05-16 HISTORY — DX: Personal history of other diseases of the digestive system: Z87.19

## 2018-05-16 SURGERY — PHOTOCOAGULATION
Anesthesia: Monitor Anesthesia Care | Site: Eye | Laterality: Left

## 2018-05-16 MED ORDER — ONDANSETRON HCL 4 MG/2ML IJ SOLN: 4.0000 mg | Freq: Once | INTRAMUSCULAR | Status: DC | PRN

## 2018-05-16 MED ORDER — LIDOCAINE HCL 2 % IJ SOLN
INTRAMUSCULAR | Status: DC | PRN
  Administered 2018-05-16: 4 mL via OPHTHALMIC

## 2018-05-16 MED ORDER — ALFENTANIL 500 MCG/ML IJ INJ
INJECTION | INTRAVENOUS | Status: DC | PRN
  Administered 2018-05-16: 200 ug via INTRAVENOUS
  Administered 2018-05-16: 500 ug via INTRAVENOUS

## 2018-05-16 MED ORDER — ACETAMINOPHEN 160 MG/5ML PO SOLN: 325.0000 mg | ORAL | Status: DC | PRN

## 2018-05-16 MED ORDER — NEOMYCIN-POLYMYXIN-DEXAMETH 3.5-10000-0.1 OP OINT
TOPICAL_OINTMENT | OPHTHALMIC | Status: DC | PRN
  Administered 2018-05-16: 1 via OPHTHALMIC

## 2018-05-16 MED ORDER — DEXMEDETOMIDINE HCL 200 MCG/2ML IV SOLN
INTRAVENOUS | Status: DC | PRN
  Administered 2018-05-16: 4 ug via INTRAVENOUS

## 2018-05-16 MED ORDER — CYCLOPENTOLATE HCL 2 % OP SOLN
OPHTHALMIC | Status: DC | PRN
  Administered 2018-05-16: 2 [drp] via OPHTHALMIC

## 2018-05-16 MED ORDER — ACETAMINOPHEN 325 MG PO TABS: 325.0000 mg | ORAL_TABLET | ORAL | Status: DC | PRN

## 2018-05-16 MED ORDER — LACTATED RINGERS IV SOLN: 10.0000 mL/h | INTRAVENOUS | Status: DC

## 2018-05-16 SURGICAL SUPPLY — 12 items
BANDAGE EYE OVAL (MISCELLANEOUS) ×6 IMPLANT
DEVICE G-PROBE SGL USE (Laser) IMPLANT
DEVICE MICRO PULS P3 SGL USE (Laser) ×2 IMPLANT
G-PROBE SGL USE (Laser)
GAUZE SPONGE 4X4 12PLY STRL (GAUZE/BANDAGES/DRESSINGS) ×3 IMPLANT
NDL FILTER BLUNT 18X1 1/2 (NEEDLE) ×1 IMPLANT
NDL RETROBULBAR .5 NSTRL (NEEDLE) ×3 IMPLANT
NEEDLE FILTER BLUNT 18X 1/2SAF (NEEDLE) ×2
NEEDLE FILTER BLUNT 18X1 1/2 (NEEDLE) ×1 IMPLANT
SYRINGE 10CC LL (SYRINGE) ×3 IMPLANT
WATER STERILE IRR 250ML POUR (IV SOLUTION) ×3 IMPLANT
WATER STERILE IRR 500ML POUR (IV SOLUTION) IMPLANT

## 2018-05-16 NOTE — Anesthesia Preprocedure Evaluation (Signed)
Anesthesia Evaluation  Patient identified by MRN, date of birth, ID band Patient awake    Reviewed: Allergy & Precautions, NPO status , Patient's Chart, lab work & pertinent test results  History of Anesthesia Complications (+) history of anesthetic complications (Pt with jaw bruises after endoscopy in 03/2018)  Airway Mallampati: III  TM Distance: >3 FB Neck ROM: Full    Dental  (+) Partial Upper   Pulmonary neg pulmonary ROS,    Pulmonary exam normal breath sounds clear to auscultation       Cardiovascular Exercise Tolerance: Good hypertension, Normal cardiovascular exam Rhythm:Regular Rate:Normal     Neuro/Psych  Headaches, PSYCHIATRIC DISORDERS Depression    GI/Hepatic GERD  Medicated and Controlled,IBS   Endo/Other  negative endocrine ROS  Renal/GU negative Renal ROS     Musculoskeletal  (+) Arthritis , Osteoarthritis,    Abdominal   Peds  Hematology  (+) Blood dyscrasia, anemia , Breast CA   Anesthesia Other Findings   Reproductive/Obstetrics                             Anesthesia Physical Anesthesia Plan  ASA: III  Anesthesia Plan: MAC   Post-op Pain Management:    Induction: Intravenous  PONV Risk Score and Plan: 2 and TIVA  Airway Management Planned: Natural Airway  Additional Equipment:   Intra-op Plan:   Post-operative Plan:   Informed Consent: I have reviewed the patients History and Physical, chart, labs and discussed the procedure including the risks, benefits and alternatives for the proposed anesthesia with the patient or authorized representative who has indicated his/her understanding and acceptance.     Plan Discussed with: CRNA  Anesthesia Plan Comments:         Anesthesia Quick Evaluation

## 2018-05-16 NOTE — Transfer of Care (Signed)
Immediate Anesthesia Transfer of Care Note  Patient: Abigail Shaw  Procedure(s) Performed: TRANSCLERAL DIODE CYCLOPHOTOCOAGULATION LEFT IVA BLOCK (Left Eye)  Patient Location: PACU  Anesthesia Type: MAC  Level of Consciousness: awake, alert  and patient cooperative  Airway and Oxygen Therapy: Patient Spontanous Breathing and Patient connected to supplemental oxygen  Post-op Assessment: Post-op Vital signs reviewed, Patient's Cardiovascular Status Stable, Respiratory Function Stable, Patent Airway and No signs of Nausea or vomiting  Post-op Vital Signs: Reviewed and stable  Complications: No apparent anesthesia complications

## 2018-05-16 NOTE — Op Note (Signed)
DATE OF SURGERY: 05/16/2018  PREOPERATIVE DIAGNOSES: Severe stage primary open angle glaucoma, left eye.      H40.11x3  POSTOPERATIVE DIAGNOSES: Same  PROCEDURES PERFORMED: Transscleral diode cyclophotocoagulation, left eye  SURGEON: [place] Glyn Gerads, M.D.  ANESTHESIA: Retrobulbar block of Xylocaine and Bupivacaine and Hyaluronidase  COMPLICATIONS: None.  INDICATIONS FOR PROCEDURE: Abigail Shaw is a 83 y.o. year-old female with uncontrolled primary open angle glaucoma. The risks and benefits of glaucoma surgery were discussed with the patient, and she consented for a diode laser surgery.  PROCEDURE IN DETAIL: The eye for surgery was verified during the time-out procedure in the operating room. A retrobulbar block of lidocaine, Marcaine, and hyaluronidase was done for anesthesia. A micropulse probe was applied to each hemilimbus with the following settings: 2042mW, 31.3% duty cycle, 120 seconds. Topical cyclogyl; 2% drops and Maxitrol ointment were applied. The eye was pressure patched closed. The patient tolerated the procedure well and was transferred to the Post-operative Care Unit in stable condition.

## 2018-05-16 NOTE — Anesthesia Procedure Notes (Signed)
Procedure Name: MAC Date/Time: 05/16/2018 11:28 AM Performed by: Janna Arch, CRNA Pre-anesthesia Checklist: Patient identified, Emergency Drugs available, Suction available and Patient being monitored Patient Re-evaluated:Patient Re-evaluated prior to induction Oxygen Delivery Method: Nasal cannula

## 2018-05-16 NOTE — H&P (Signed)
The History and Physical notes are on paper, have been signed, and are to be scanned. The patient remains stable and unchanged from the H&P.   Previous H&P reviewed, patient examined, and there are no changes.  Abigail Shaw 05/16/2018 11:02 AM

## 2018-05-16 NOTE — Anesthesia Postprocedure Evaluation (Signed)
Anesthesia Post Note  Patient: Abigail Shaw  Procedure(s) Performed: TRANSCLERAL DIODE CYCLOPHOTOCOAGULATION LEFT IVA BLOCK (Left Eye)  Patient location during evaluation: PACU Anesthesia Type: MAC Level of consciousness: awake and alert, oriented and patient cooperative Pain management: pain level controlled Vital Signs Assessment: post-procedure vital signs reviewed and stable Respiratory status: spontaneous breathing, nonlabored ventilation and respiratory function stable Cardiovascular status: blood pressure returned to baseline and stable Postop Assessment: adequate PO intake Anesthetic complications: no    Darrin Nipper

## 2018-05-16 NOTE — OR Nursing (Signed)
.   A micropulse probe was applied to each hemilimbus with the following settings: 2042mW, 31.3% duty cycle, 120 seconds.

## 2018-05-17 ENCOUNTER — Encounter: Payer: Self-pay | Admitting: Ophthalmology

## 2018-05-25 ENCOUNTER — Other Ambulatory Visit: Payer: Self-pay

## 2018-05-25 DIAGNOSIS — N644 Mastodynia: Secondary | ICD-10-CM

## 2018-05-25 DIAGNOSIS — Z170 Estrogen receptor positive status [ER+]: Secondary | ICD-10-CM

## 2018-05-25 DIAGNOSIS — C50411 Malignant neoplasm of upper-outer quadrant of right female breast: Secondary | ICD-10-CM

## 2018-05-31 ENCOUNTER — Other Ambulatory Visit: Payer: Self-pay | Admitting: General Surgery

## 2018-05-31 ENCOUNTER — Ambulatory Visit
Admission: RE | Admit: 2018-05-31 | Discharge: 2018-05-31 | Disposition: A | Discharge status: Home / Self Care | Payer: Medicare Other | Source: Ambulatory Visit | Attending: General Surgery | Admitting: General Surgery

## 2018-05-31 DIAGNOSIS — C50411 Malignant neoplasm of upper-outer quadrant of right female breast: Secondary | ICD-10-CM

## 2018-05-31 DIAGNOSIS — N63 Unspecified lump in unspecified breast: Secondary | ICD-10-CM

## 2018-05-31 DIAGNOSIS — N644 Mastodynia: Secondary | ICD-10-CM

## 2018-05-31 DIAGNOSIS — Z170 Estrogen receptor positive status [ER+]: Secondary | ICD-10-CM | POA: Insufficient documentation

## 2018-05-31 HISTORY — DX: Personal history of irradiation: Z92.3

## 2018-06-05 ENCOUNTER — Other Ambulatory Visit: Payer: Medicare Other

## 2018-06-12 ENCOUNTER — Encounter: Payer: Self-pay | Admitting: General Surgery

## 2018-06-12 ENCOUNTER — Ambulatory Visit: Payer: Medicare Other | Admitting: General Surgery

## 2018-06-12 VITALS — BP 154/78 | HR 52 | Resp 10 | Ht 63.0 in | Wt 156.0 lb

## 2018-06-12 DIAGNOSIS — C50411 Malignant neoplasm of upper-outer quadrant of right female breast: Secondary | ICD-10-CM | POA: Diagnosis not present

## 2018-06-12 DIAGNOSIS — Z170 Estrogen receptor positive status [ER+]: Secondary | ICD-10-CM

## 2018-06-12 DIAGNOSIS — N644 Mastodynia: Secondary | ICD-10-CM | POA: Diagnosis not present

## 2018-06-12 NOTE — Progress Notes (Signed)
Patient ID: Abigail Shaw, female   DOB: January 02, 1935, 83 y.o.   MRN: [id number]  Chief Complaint  Patient presents with  . Follow-up    HPI Abigail Shaw is a 83 y.o. female.  who presents for her follow up right breast cancer and a breast evaluation, former patient of Dr Jamal Collin. The most recent mammogram and right breast ultrasound was done on 05-31-18 . She has been having right breast pain described as "sharp" shooting pains that would come and go. Sometimes it happens every day for a week but it does seem to be some better this week. This started a few months ago. She didn't try anything to relieve the pain. Denies any breast injury or trauma. She states that the scar tissue in that breast seems to be larger than before. She states she had shingles on her right side 7-8 years ago. Patient does perform regular self breast checks and gets regular mammograms done.   She has stopped the Aromasin about 2 weeks ago per Dr Janese Banks.  HPI  Past Medical History:  Diagnosis Date  . Arthritis    joints  . Breast cyst   . Cancer ([place]) 2014   right breast cancer and radiation  . Complication of anesthesia    difficulty waking up-quit breathing during first TDC, bruising on jaw after surgery  . Depression   . GERD (gastroesophageal reflux disease)   . Glaucoma   . Headache    migraines, none for awhile  . History of hiatal hernia   . Hypercholesteremia   . Hypertension    controlled on meds  . IBS (irritable bowel syndrome)   . Joint pain   . Motion sickness    ferry, cars  . Osteoporosis   . Personal history of radiation therapy 2014   Right breast  . Shingles 2012?   right side  . Wears dentures    partial upper (1 tooth)    Past Surgical History:  Procedure Laterality Date  . ABDOMINAL HYSTERECTOMY  1976  . BREAST BIOPSY Right 2014   Invasive Carcinoma  . BREAST LUMPECTOMY Right 2014   + mammo cite rad and aromasin  . BREAST MAMMOSITE Right 08-01-13  . BREAST SURGERY Right  07-02-2013   with sentinal biopsy  . BREAST SURGERY Right 07-18-13  . CARDIAC CATHETERIZATION    . CATARACT EXTRACTION W/ INTRAOCULAR LENS  IMPLANT, BILATERAL    . COLONOSCOPY     Dr Candace Cruise  . ESOPHAGOGASTRODUODENOSCOPY (EGD) WITH PROPOFOL N/A 04/04/2018   Procedure: ESOPHAGOGASTRODUODENOSCOPY (EGD) WITH PROPOFOL;  Surgeon: Toledo, Benay Pike, MD;  Location: [facility] ENDOSCOPY;  Service: Gastroenterology;  Laterality: N/A;  . EXCISION VAGINAL CYST    . FOOT SURGERY Left 12/05/14  . HAND SURGERY     trigger finger  . MM BREAST STEREO BX*R*R/S Right 2014  . PHOTOCOAGULATION Left 05/16/2018   Procedure: TRANSCLERAL DIODE CYCLOPHOTOCOAGULATION LEFT IVA BLOCK;  Surgeon: Leandrew Koyanagi, MD;  Location: [place];  Service: Ophthalmology;  Laterality: Left;  IVA BLOCK LEFT  . PHOTOCOAGULATION WITH LASER Left 08/22/2016   Procedure: PHOTOCOAGULATION WITH LASER;  Surgeon: Ronnell Freshwater, MD;  Location: [place];  Service: Ophthalmology;  Laterality: Left;  KEEP PT 2ND    Family History  Problem Relation Age of Onset  . Cancer Mother        age 23 breast  . Breast cancer Mother 49  . Cancer Brother        prostate/lung/ liver /spleen  .  Breast cancer Maternal Aunt        age 23  . Colon cancer Paternal Aunt     Social History Social History   Tobacco Use  . Smoking status: Never Smoker  . Smokeless tobacco: Never Used  Substance Use Topics  . Alcohol use: No  . Drug use: No    Allergies  Allergen Reactions  . Lisinopril Cough       . Simbrinza [Brinzolamide-Brimonidine]     lethargy  . Sulfamethoxazole-Trimethoprim     Other reaction(s): Other (See Comments) HYPONATREMIA  . Tape Other (See Comments)    Redness and irritation    Current Outpatient Medications  Medication Sig Dispense Refill  . amLODipine (NORVASC) 2.5 MG tablet Take 2.5 mg by mouth daily. am    . Biotin 1000 MCG tablet Take 1,000 mcg by mouth daily. am    . cetirizine (ZYRTEC) 10  MG tablet Take 10 mg by mouth every evening.     . dorzolamide-timolol (COSOPT) 22.3-6.8 MG/ML ophthalmic solution Place 1 drop into both eyes 2 (two) times daily.     Marland Kitchen FLUoxetine (PROZAC) 20 MG capsule Take 20 mg by mouth daily. am    . fluticasone (FLONASE) 50 MCG/ACT nasal spray Place 2 sprays into both nostrils daily. am    . LORazepam (ATIVAN) 0.5 MG tablet Take 0.5 mg by mouth at bedtime. And as needed    . losartan (COZAAR) 50 MG tablet Take 50 mg by mouth every evening.     . pantoprazole (PROTONIX) 40 MG tablet Take 40 mg by mouth 2 (two) times daily. Am and pm    . rizatriptan (MAXALT) 10 MG tablet Take 10 mg by mouth as needed for migraine. May repeat in 2 hours if needed    . rosuvastatin (CRESTOR) 5 MG tablet Take 5 mg by mouth every other day.     . Travoprost, BAK Free, (TRAVATAN) 0.004 % SOLN ophthalmic solution Place 1 drop into both eyes at bedtime.    . Vitamin D, Ergocalciferol, (DRISDOL) 50000 UNITS CAPS capsule Take 1 capsule by mouth once a week.     No current facility-administered medications for this visit.     Review of Systems Review of Systems  Constitutional: Negative.   Respiratory: Negative.   Cardiovascular: Negative.     Blood pressure (!) 154/78, pulse (!) 52, resp. rate 10, height 5\' 3"  (1.6 m), weight 156 lb (70.8 kg), SpO2 98 %.  Physical Exam Physical Exam  Constitutional: She is oriented to person, place, and time. She appears well-developed and well-nourished.  HENT:  Mouth/Throat: Oropharynx is clear and moist.  Eyes: Conjunctivae are normal. No scleral icterus.  Neck: Neck supple.  Cardiovascular: Normal rate, regular rhythm and normal heart sounds.  Pulmonary/Chest: Effort normal and breath sounds normal.     Right breast exhibits tenderness. Right breast exhibits no inverted nipple, no mass, no nipple discharge and no skin change. Left breast exhibits tenderness. Left breast exhibits no inverted nipple, no mass, no nipple discharge and  no skin change.  Right breast lumpectomy incision well healed. tenderness laterally both breast.     Lymphadenopathy:    She has no cervical adenopathy.    She has no axillary adenopathy.  Neurological: She is alert and oriented to person, place, and time.  Skin: Skin is warm and dry.  Psychiatric: Her behavior is normal.    Data Reviewed Bilateral diagnostic mammogram and right breast ultrasound of May 31, 2018 reviewed.  Postsurgical changes.  Resolving seroma.  BI-RADS-2.  Assessment    Benign breast exam, no clear etiology for intermittent sharp diffuse breast discomfort.    Plan    The patient is aware to use a heating pad as needed for comfort.  Patient will be asked to return to the office in one year with a bilateral screening mammogram.       HPI, Physical Exam, Assessment and Plan have been scribed under the direction and in the presence of Robert Bellow, MD. Karie Fetch, RN  I have completed the exam and reviewed the above documentation for accuracy and completeness.  I agree with the above.  Haematologist has been used and any errors in dictation or transcription are unintentional.  Hervey Ard, M.D., F.A.C.S.  Forest Gleason Yaneisy Wenz 06/12/2018, 7:02 PM

## 2018-06-12 NOTE — Patient Instructions (Addendum)
The patient is aware to call back for any questions or concerns. The patient is aware to use a heating pad as needed for comfort.  

## 2018-07-05 IMAGING — US US BREAST*R* LIMITED INC AXILLA
1 series · 10 of 10 positions shown · non-contrast
Comparison: Previous exam(s).

CLINICAL DATA: 81-year-old female presenting for evaluation of
right breast tenderness and lumps in the upper-outer quadrant. The
patient had a fall in Wednesday October, 2016. The breast was not injured
at that time, however she has experienced tender and lumps near her
lumpectomy site in the right breast since the injury.

EXAM:
2D DIGITAL DIAGNOSTIC UNILATERAL RIGHT MAMMOGRAM WITH CAD AND
ADJUNCT TOMO
RIGHT BREAST ULTRASOUND

[Series 1: us breast*right* limited inc axilla · 0.06mm/px · 10 of 10 slices shown]
[im 1/10]
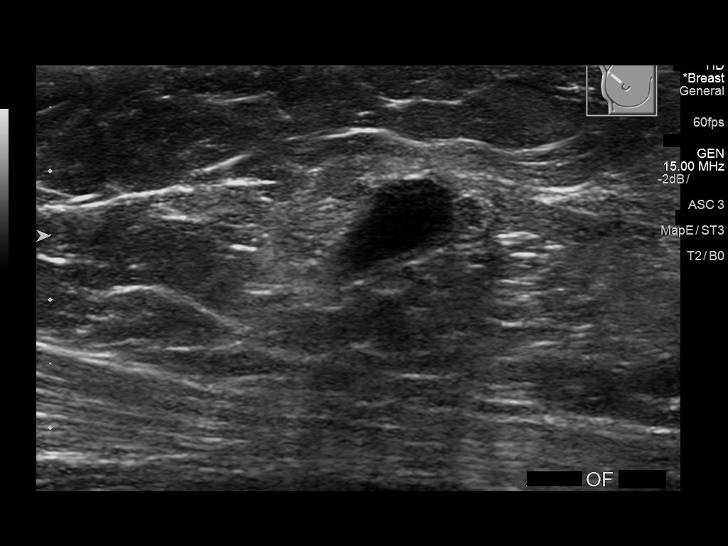
[im 2/10]
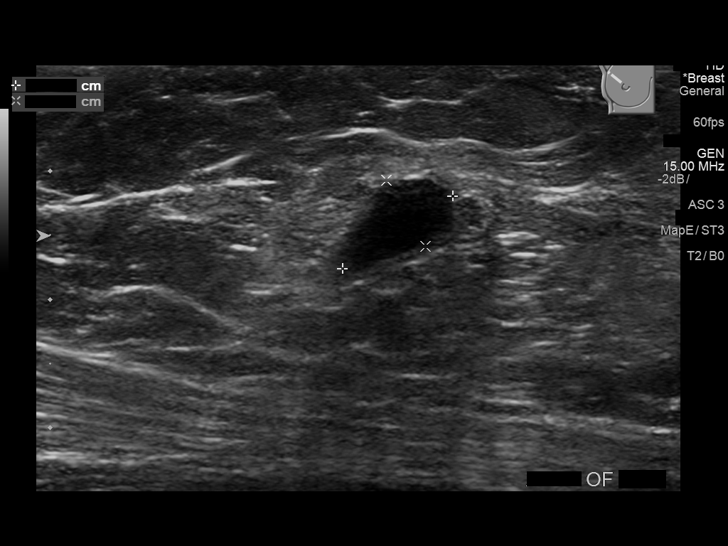
[im 3/10]
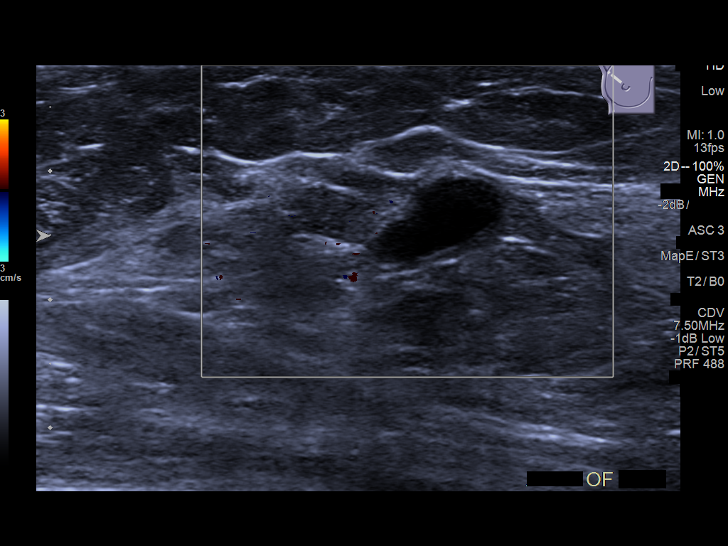
[im 4/10]
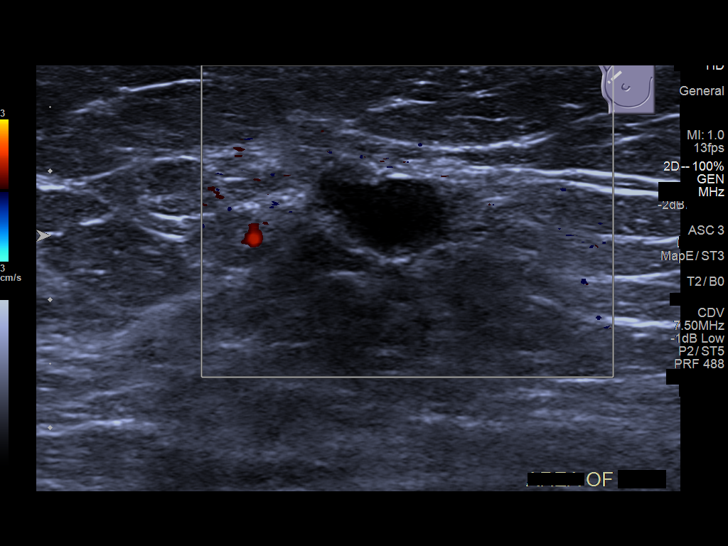
[im 5/10]
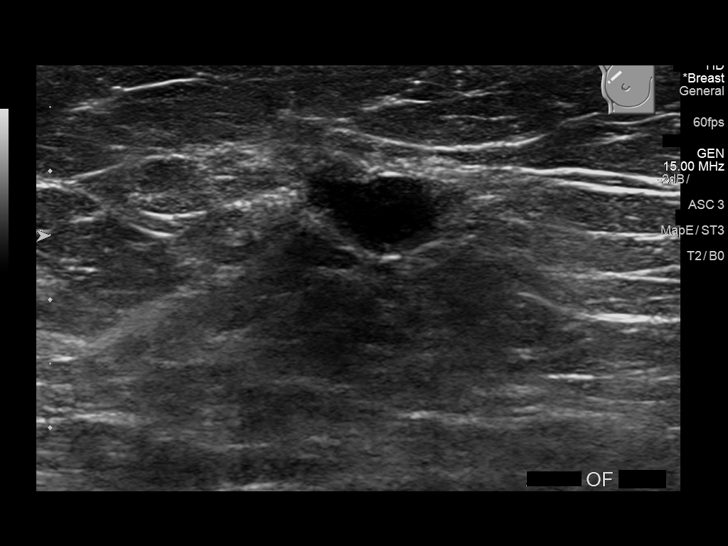
[im 6/10]
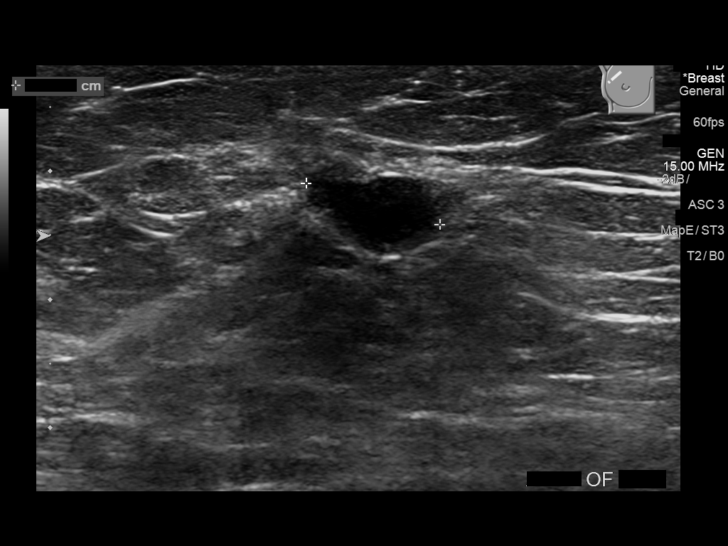
[im 7/10]
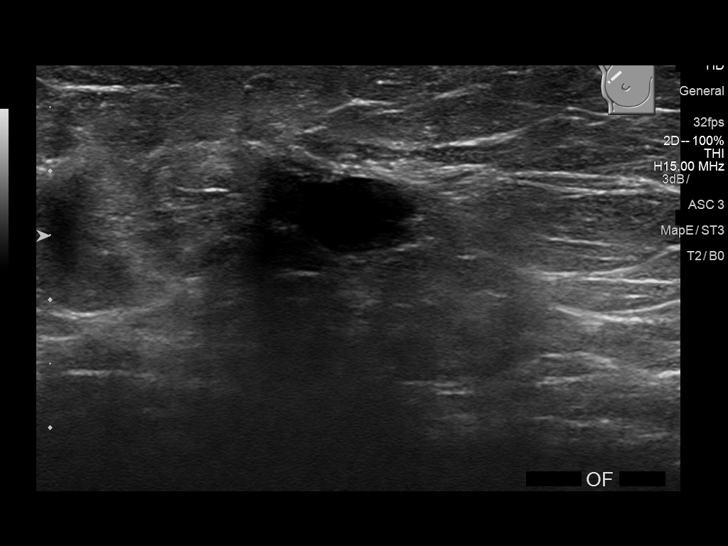
[im 8/10]
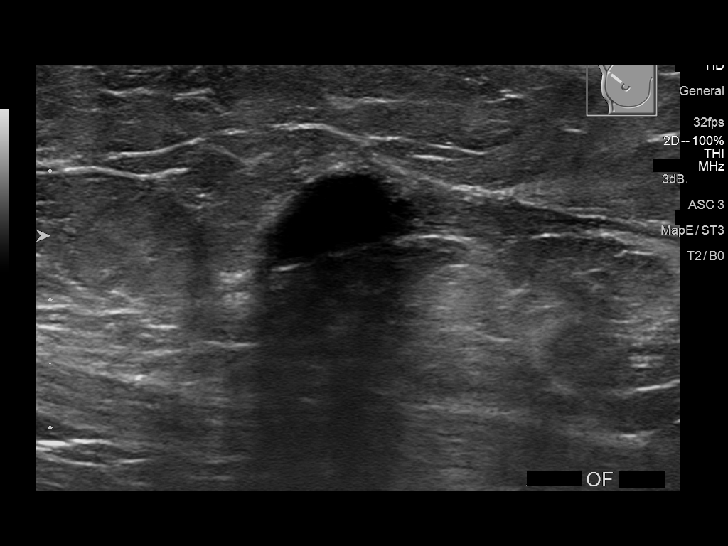
[im 9/10]
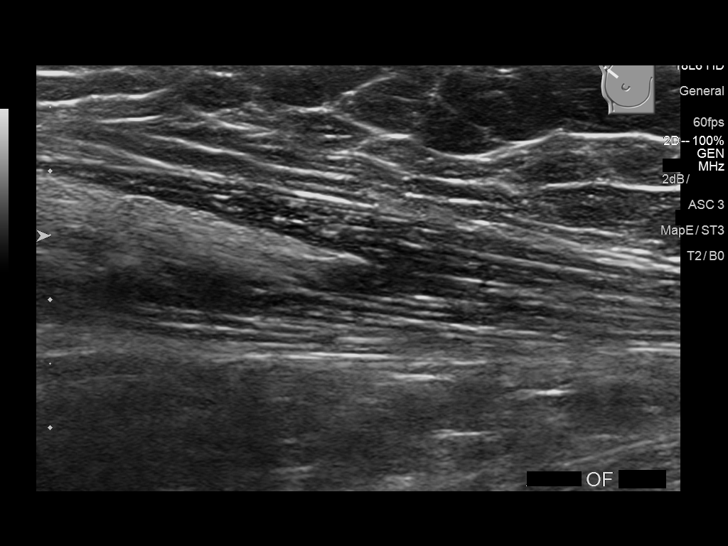
[im 10/10]
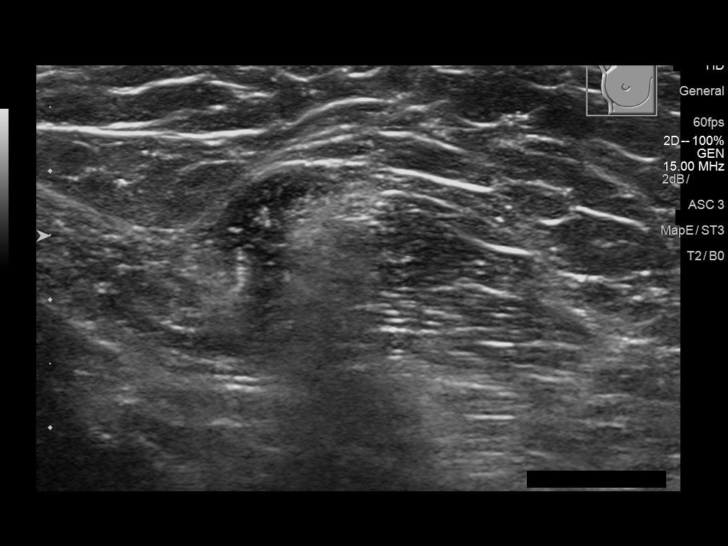

[10 of 10 positions shown; findings below may reference images not displayed]

ACR Breast Density Category b: There are scattered areas of
fibroglandular density.
FINDINGS: Two palpable markers have been placed on the upper-outer quadrant of
the right breast. One overlies the lumpectomy site, and the other is
near the axilla. There are no new suspicious mammographic findings
identified deep to these markers. No suspicious calcifications,
masses or areas of distortion are seen in the right breast.

Mammographic images were processed with CAD.

Physical exam of the upper-outer quadrant of the right breast
demonstrates firm tissue at the lumpectomy site. There are no
separate discrete palpable masses identified, though the patient
does experience tenderness on palpation.

Ultrasound targeted to the upper-outer quadrant of the right breast
at 10 o'clock, 9 cm from the nipple demonstrates an oval anechoic
circumscribed mass consistent with a small oil cyst at the
lumpectomy site. This measures 1.1 x 0.6 x 1.0 cm. At 10 o'clock, 12
cm from the nipple at the second palpable tender site of concern
there is normal fibroglandular tissue. No suspicious masses or areas
of shadowing are identified at either palpable site.
IMPRESSION: 1. No abnormal mammographic or targeted sonographic findings are
seen at the palpable tender sites of concern in the upper-outer
right breast.

RECOMMENDATION:
1. Clinical follow-up recommended for the palpable tender areas of
concern in the right breast. Any further workup should be based on
clinical grounds. As the areas of concern are near the lumpectomy
site, if there is persisting clinical concern, MRI is recommended.

2. Bilateral diagnostic mammogram is recommended in Sunday June, 2017 to
resume annual surveillance.

I have discussed the findings and recommendations with the patient.
Results were also provided in writing at the conclusion of the
visit. If applicable, a reminder letter will be sent to the patient
regarding the next appointment.

BI-RADS CATEGORY  2: Benign.

## 2018-09-18 ENCOUNTER — Inpatient Hospital Stay: Payer: Medicare Other | Admitting: Oncology

## 2018-09-28 ENCOUNTER — Inpatient Hospital Stay: Payer: Medicare Other

## 2018-09-28 ENCOUNTER — Encounter: Payer: Self-pay | Admitting: Oncology

## 2018-09-28 ENCOUNTER — Inpatient Hospital Stay: Payer: Medicare Other | Attending: Oncology | Admitting: Oncology

## 2018-09-28 ENCOUNTER — Telehealth: Payer: Self-pay | Admitting: Oncology

## 2018-09-28 VITALS — BP 120/65 | HR 54 | Temp 97.9°F | Resp 18 | Ht 63.0 in | Wt 154.4 lb

## 2018-09-28 DIAGNOSIS — Z170 Estrogen receptor positive status [ER+]: Secondary | ICD-10-CM | POA: Insufficient documentation

## 2018-09-28 DIAGNOSIS — C50411 Malignant neoplasm of upper-outer quadrant of right female breast: Secondary | ICD-10-CM | POA: Diagnosis not present

## 2018-09-28 DIAGNOSIS — D509 Iron deficiency anemia, unspecified: Secondary | ICD-10-CM | POA: Insufficient documentation

## 2018-09-28 DIAGNOSIS — I10 Essential (primary) hypertension: Secondary | ICD-10-CM | POA: Diagnosis not present

## 2018-09-28 DIAGNOSIS — M810 Age-related osteoporosis without current pathological fracture: Secondary | ICD-10-CM | POA: Diagnosis not present

## 2018-09-28 LAB — IRON AND TIBC
Iron: 88 ug/dL (ref 28–170)
Saturation Ratios: 22 % (ref 10.4–31.8)
TIBC: 407 ug/dL (ref 250–450)
UIBC: 319 ug/dL

## 2018-09-28 LAB — CBC WITH DIFFERENTIAL/PLATELET
BASOS ABS: 0 10*3/uL (ref 0–0.1)
Basophils Relative: 1 %
EOS ABS: 0.1 10*3/uL (ref 0–0.7)
EOS PCT: 1 %
HCT: 35.4 % (ref 35.0–47.0)
Hemoglobin: 11.8 g/dL — ABNORMAL LOW (ref 12.0–16.0)
LYMPHS PCT: 35 %
Lymphs Abs: 1.7 10*3/uL (ref 1.0–3.6)
MCH: 27.2 pg (ref 26.0–34.0)
MCHC: 33.3 g/dL (ref 32.0–36.0)
MCV: 81.7 fL (ref 80.0–100.0)
Monocytes Absolute: 0.5 10*3/uL (ref 0.2–0.9)
Monocytes Relative: 10 %
Neutro Abs: 2.6 10*3/uL (ref 1.4–6.5)
Neutrophils Relative %: 53 %
PLATELETS: 231 10*3/uL (ref 150–440)
RBC: 4.33 MIL/uL (ref 3.80–5.20)
RDW: 15.2 % — AB (ref 11.5–14.5)
WBC: 5 10*3/uL (ref 3.6–11.0)

## 2018-09-28 LAB — FERRITIN: FERRITIN: 19 ng/mL (ref 11–307)

## 2018-09-28 NOTE — Progress Notes (Signed)
No new  Changes noted today 

## 2018-09-30 NOTE — Progress Notes (Signed)
Hematology/Oncology Consult note [facility]  Telephone:([phone] [phone]  Patient Care Team: [name], MD as PCP - General (Family Medicine) Christene Lye, MD (General Surgery) [name], MD as Referring Physician (Family Medicine)   Name of the patient: Abigail Shaw  [id number]  [birth year]   Date of visit: 09/30/18  Diagnosis- 1.Stage 1 breast cancer on aromasin 2. Iron deficiency anemia   Chief complaint/ Reason for visit- routine f/u of breast cancer and anemia  Heme/Onc history: 1. 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 Tumor size 1.5 cm, grade 1, 2 sentinel nodes negative for metastasis. ER positive (80%), PR positive (90%). HER-2/neu negative by FISH (Her2/CEP17 ratio 1.15). Oncotype DX test reportedly had a score of 18. Started adjuvant hormonal therapy with Anastrazole in Aug 2014 changed to exemestane January 2015. She completed 5 years of hormone therapy in august 2019.  2. History of mild osteoporosis -on calcium + vitamin D.Fosamax stopped. She did not wish to pursue parenteral bisphosphonates  3. Iron deficiency- she was scheduled to go for colonoscopy but did not get through because of diarrhea.  Interval history- she reports feeling well. Appetite is good. denies any unintentional weight loss. She reports occasional breast tenderness which comes and goes  ECOG PS- 1 Pain scale- 0 Opioid associated constipation- no  Review of systems- Review of Systems  Constitutional: Negative for chills, fever, malaise/fatigue and weight loss.  HENT: Negative for congestion, ear discharge and nosebleeds.   Eyes: Negative for blurred vision.  Respiratory: Negative for cough, hemoptysis, sputum production, shortness of breath and wheezing.   Cardiovascular: Negative for  chest pain, palpitations, orthopnea and claudication.  Gastrointestinal: Negative for abdominal pain, blood in stool, constipation, diarrhea, heartburn, melena, nausea and vomiting.  Genitourinary: Negative for dysuria, flank pain, frequency, hematuria and urgency.  Musculoskeletal: Negative for back pain, joint pain and myalgias.  Skin: Negative for rash.  Neurological: Negative for dizziness, tingling, focal weakness, seizures, weakness and headaches.  Endo/Heme/Allergies: Does not bruise/bleed easily.  Psychiatric/Behavioral: Negative for depression and suicidal ideas. The patient does not have insomnia.        Allergies  Allergen Reactions  . Lisinopril Cough       . Simbrinza [Brinzolamide-Brimonidine]     lethargy  . Sulfamethoxazole-Trimethoprim     Other reaction(s): Other (See Comments) HYPONATREMIA  . Tape Other (See Comments)    Redness and irritation     Past Medical History:  Diagnosis Date  . Arthritis    joints  . Breast cyst   . Cancer ([place]) 2014   right breast cancer and radiation  . Complication of anesthesia    difficulty waking up-quit breathing during first TDC, bruising on jaw after surgery  . Depression   . GERD (gastroesophageal reflux disease)   . Glaucoma   . Headache    migraines, none for awhile  . History of hiatal hernia   . Hypercholesteremia   . Hypertension    controlled on meds  . IBS (irritable bowel syndrome)   . Joint pain   . Motion sickness    ferry, cars  . Osteoporosis   . Personal history of radiation therapy 2014   Right breast  . Shingles 2012?   right side  . Wears dentures    partial upper (1 tooth)     Past Surgical History:  Procedure  Laterality Date  . ABDOMINAL HYSTERECTOMY  1976  . BREAST BIOPSY Right 2014   Invasive Carcinoma  . BREAST LUMPECTOMY Right 2014   + mammo cite rad and aromasin  . BREAST MAMMOSITE Right 08-01-13  . BREAST SURGERY Right 07-02-2013   with sentinal biopsy  . BREAST SURGERY  Right 07-18-13  . CARDIAC CATHETERIZATION    . CATARACT EXTRACTION W/ INTRAOCULAR LENS  IMPLANT, BILATERAL    . COLONOSCOPY     Dr Candace Cruise  . ESOPHAGOGASTRODUODENOSCOPY (EGD) WITH PROPOFOL N/A 04/04/2018   Procedure: ESOPHAGOGASTRODUODENOSCOPY (EGD) WITH PROPOFOL;  Surgeon: Toledo, Benay Pike, MD;  Location: [facility];  Service: Gastroenterology;  Laterality: N/A;  . EXCISION VAGINAL CYST    . FOOT SURGERY Left 12/05/14  . HAND SURGERY     trigger finger  . MM BREAST STEREO BX*R*R/S Right 2014  . PHOTOCOAGULATION Left 05/16/2018   Procedure: TRANSCLERAL DIODE CYCLOPHOTOCOAGULATION LEFT IVA BLOCK;  Surgeon: Leandrew Koyanagi, MD;  Location: [place];  Service: Ophthalmology;  Laterality: Left;  IVA BLOCK LEFT  . PHOTOCOAGULATION WITH LASER Left 08/22/2016   Procedure: PHOTOCOAGULATION WITH LASER;  Surgeon: Ronnell Freshwater, MD;  Location: [place];  Service: Ophthalmology;  Laterality: Left;  KEEP PT 2ND    Social History   Socioeconomic History  . Marital status: Married    Spouse name: Not on file  . Number of children: 5  . Years of education: 45  . Highest education level: Not on file  Occupational History  . Occupation: Retired  Scientific laboratory technician  . Financial resource strain: Not on file  . Food insecurity:    Worry: Not on file    Inability: Not on file  . Transportation needs:    Medical: Not on file    Non-medical: Not on file  Tobacco Use  . Smoking status: Never Smoker  . Smokeless tobacco: Never Used  Substance and Sexual Activity  . Alcohol use: No  . Drug use: No  . Sexual activity: Not on file  Lifestyle  . Physical activity:    Days per week: Not on file    Minutes per session: Not on file  . Stress: Not on file  Relationships  . Social connections:    Talks on phone: Not on file    Gets together: Not on file    Attends religious service: Not on file    Active member of club or organization: Not on file    Attends meetings of  clubs or organizations: Not on file    Relationship status: Not on file  . Intimate partner violence:    Fear of current or ex partner: Not on file    Emotionally abused: Not on file    Physically abused: Not on file    Forced sexual activity: Not on file  Other Topics Concern  . Not on file  Social History Narrative   Lives with husband   Caffeine use:  none    Family History  Problem Relation Age of Onset  . Cancer Mother        age 62 breast  . Breast cancer Mother 24  . Cancer Brother        prostate/lung/ liver /spleen  . Breast cancer Maternal Aunt        age 44  . Colon cancer Paternal Aunt      Current Outpatient Medications:  .  amLODipine (NORVASC) 2.5 MG tablet, Take 2.5 mg by mouth daily. am, Disp: , Rfl:  .  Biotin 1000 MCG tablet, Take 1,000 mcg by mouth daily. am, Disp: , Rfl:  .  cetirizine (ZYRTEC) 10 MG tablet, Take 10 mg by mouth every evening. , Disp: , Rfl:  .  dorzolamide-timolol (COSOPT) 22.3-6.8 MG/ML ophthalmic solution, Place 1 drop into both eyes 2 (two) times daily. , Disp: , Rfl:  .  FLUoxetine (PROZAC) 20 MG capsule, Take 20 mg by mouth daily. am, Disp: , Rfl:  .  fluticasone (FLONASE) 50 MCG/ACT nasal spray, Place 2 sprays into both nostrils daily. am, Disp: , Rfl:  .  losartan (COZAAR) 50 MG tablet, Take 50 mg by mouth every evening. , Disp: , Rfl:  .  rosuvastatin (CRESTOR) 5 MG tablet, Take 5 mg by mouth every other day. , Disp: , Rfl:  .  Travoprost, BAK Free, (TRAVATAN) 0.004 % SOLN ophthalmic solution, Place 1 drop into both eyes at bedtime., Disp: , Rfl:  .  Vitamin D, Ergocalciferol, (DRISDOL) 50000 UNITS CAPS capsule, Take 1 capsule by mouth once a week., Disp: , Rfl:  .  LORazepam (ATIVAN) 0.5 MG tablet, Take 0.5 mg by mouth at bedtime. And as needed, Disp: , Rfl:  .  pantoprazole (PROTONIX) 40 MG tablet, Take 40 mg by mouth 2 (two) times daily. Am and pm, Disp: , Rfl:  .  rizatriptan (MAXALT) 10 MG tablet, Take 10 mg by mouth as needed  for migraine. May repeat in 2 hours if needed, Disp: , Rfl:   Physical exam:  Vitals:   09/28/18 1330  BP: 120/65  Pulse: (!) 54  Resp: 18  Temp: 97.9 F (36.6 C)  TempSrc: Tympanic  SpO2: 98%  Weight: 154 lb 6.4 oz (70 kg)  Height: 5' 3" (1.6 m)   Physical Exam  Constitutional: She is oriented to person, place, and time. She appears well-developed and well-nourished.  HENT:  Head: Normocephalic and atraumatic.  Eyes: Pupils are equal, round, and reactive to light. EOM are normal.  Neck: Normal range of motion.  Cardiovascular: Normal rate, regular rhythm and normal heart sounds.  Pulmonary/Chest: Effort normal and breath sounds normal.  Abdominal: Soft. Bowel sounds are normal.  Neurological: She is alert and oriented to person, place, and time.  Skin: Skin is warm and dry.     CMP Latest Ref Rng & Units 10/19/2016  Glucose 65 - 99 mg/dL 99  BUN 6 - 20 mg/dL 11  Creatinine 0.44 - 1.00 mg/dL 0.73  Sodium 135 - 145 mmol/L 136  Potassium 3.5 - 5.1 mmol/L 3.9  Chloride 101 - 111 mmol/L 104  CO2 22 - 32 mmol/L 26  Calcium 8.9 - 10.3 mg/dL 9.9  Total Protein 6.5 - 8.1 g/dL 7.3  Total Bilirubin 0.3 - 1.2 mg/dL 0.3  Alkaline Phos 38 - 126 U/L 87  AST 15 - 41 U/L 26  ALT 14 - 54 U/L 18   CBC Latest Ref Rng & Units 09/28/2018  WBC 3.6 - 11.0 K/uL 5.0  Hemoglobin 12.0 - 16.0 g/dL 11.8(L)  Hematocrit 35.0 - 47.0 % 35.4  Platelets 150 - 440 K/uL 231    Assessment and plan- Patient is a 83 y.o. female with following issues:  1. Stage I right breast cancer- Patient has completed 5 years of adjuvant hormone therapy. She does not need oncology follow up for breast cancer at this time. She can comtinue to get yearly mammograms which she prefers to get through Dr. Kary Kos. We will let him know. She will also need yearly breast exams. Mammogram from  this year was negative for malignancy  2. H/o osteoporosis: Patient does nto wish to try bisphosphonates. Will hold off on boen density  scans at this time. Can be considered by Dr. Kary Kos in the future if she wishes to get it monitored.   3. Iron deficiency anemia: she is mildly anemic today. Ferritin is low at 19. This came back after she had left the office. We will reach out to her regarding her results and see if she is interested in IV iron or wishes to try oral iron. Repeat cbc ferritin and iron studies in 3 and 6 months. I will see her back in 6 months   Visit Diagnosis 1. Malignant neoplasm of upper-outer quadrant of right breast in female, estrogen receptor positive ([place])   2. Iron deficiency anemia, unspecified iron deficiency anemia type      Dr. [name], MD, MPH [facility] at [facility] [phone] 09/30/2018 11:28 AM

## 2018-10-01 ENCOUNTER — Telehealth: Payer: Self-pay | Admitting: *Deleted

## 2018-10-01 NOTE — Telephone Encounter (Signed)
Called house and husband answered the phone and I asked if pt was there and he is not and he could take a message. I told him that I wanted to go over results with her and offer her to start oral iron or IV iron. She should be back at 3 pm today but I am leaving early. He said she will be here tom. So I will call back in am.

## 2018-10-01 NOTE — Telephone Encounter (Signed)
-----   Message from Sindy Guadeloupe, MD sent at 09/30/2018 11:36 AM EDT ----- Ca you please see my note and do the needful and change the wrap up accordingly. Thanks, Astrid Divine

## 2018-10-02 ENCOUNTER — Telehealth: Payer: Self-pay | Admitting: *Deleted

## 2018-10-02 NOTE — Telephone Encounter (Signed)
Called patient today and got her voicemail.  Left a message that she is slightly anemic with a ferritin levels that are low.  Dr. Janese Banks recommended either coming in and getting IV iron or she could try oral iron over-the-counter.  And then see her back in 3 in 6 months to test her levels again.  I have left this information on her voicemail and asked her to give me a call back and let me know which option she would like today.

## 2018-10-02 NOTE — Telephone Encounter (Signed)
-----   Message from Sindy Guadeloupe, MD sent at 09/30/2018 11:36 AM EDT ----- Ca you please see my note and do the needful and change the wrap up accordingly. Thanks, Astrid Divine

## 2018-10-03 ENCOUNTER — Telehealth: Payer: Self-pay | Admitting: *Deleted

## 2018-10-03 DIAGNOSIS — D509 Iron deficiency anemia, unspecified: Secondary | ICD-10-CM

## 2018-10-03 NOTE — Telephone Encounter (Signed)
-----   Message from Sindy Guadeloupe, MD sent at 09/30/2018 11:36 AM EDT ----- Ca you please see my note and do the needful and change the wrap up accordingly. Thanks, Astrid Divine

## 2018-10-03 NOTE — Telephone Encounter (Signed)
Patient called back after she got my message about her lab yesterday.  Went over her ferritin level and her hemoglobin level.  Went over she could try oral iron versus IV iron and patient decided that she wants the IV iron.  I told her we will put her on starting next week and then a repeat iron in 1 week after.  She will need to come back in 3 months for labs in 6 months for labs and Dr. she is agreeable for this, and also she wants afternoon appointments.  I told patient I will send a message to the scheduler with all this above and she will give the patient a call with her appointments.  Patient agreeable to above

## 2018-10-04 ENCOUNTER — Other Ambulatory Visit: Payer: Self-pay | Admitting: Oncology

## 2018-10-04 DIAGNOSIS — D509 Iron deficiency anemia, unspecified: Secondary | ICD-10-CM | POA: Insufficient documentation

## 2018-10-12 ENCOUNTER — Inpatient Hospital Stay: Payer: Medicare Other

## 2018-10-12 VITALS — BP 147/76 | HR 54 | Temp 97.0°F | Resp 18

## 2018-10-12 DIAGNOSIS — C50411 Malignant neoplasm of upper-outer quadrant of right female breast: Secondary | ICD-10-CM | POA: Diagnosis not present

## 2018-10-12 DIAGNOSIS — D509 Iron deficiency anemia, unspecified: Secondary | ICD-10-CM

## 2018-10-12 MED ORDER — SODIUM CHLORIDE 0.9 % IV SOLN
510.0000 mg | Freq: Once | INTRAVENOUS | Status: AC
  Administered 2018-10-12: 510 mg via INTRAVENOUS
  Filled 2018-10-12: qty 17

## 2018-10-12 MED ORDER — SODIUM CHLORIDE 0.9 % IV SOLN
Freq: Once | INTRAVENOUS | Status: AC
  Administered 2018-10-12: 11:00:00 via INTRAVENOUS
  Filled 2018-10-12: qty 250

## 2018-10-19 ENCOUNTER — Inpatient Hospital Stay: Payer: Medicare Other

## 2018-10-19 VITALS — BP 133/71 | HR 60 | Temp 98.4°F | Resp 18

## 2018-10-19 DIAGNOSIS — C50411 Malignant neoplasm of upper-outer quadrant of right female breast: Secondary | ICD-10-CM | POA: Diagnosis not present

## 2018-10-19 DIAGNOSIS — D509 Iron deficiency anemia, unspecified: Secondary | ICD-10-CM

## 2018-10-19 MED ORDER — SODIUM CHLORIDE 0.9 % IV SOLN
Freq: Once | INTRAVENOUS | Status: AC
  Administered 2018-10-19: 15:00:00 via INTRAVENOUS
  Filled 2018-10-19: qty 250

## 2018-10-19 MED ORDER — SODIUM CHLORIDE 0.9 % IV SOLN
510.0000 mg | Freq: Once | INTRAVENOUS | Status: AC
  Administered 2018-10-19: 510 mg via INTRAVENOUS
  Filled 2018-10-19: qty 17

## 2018-11-02 ENCOUNTER — Ambulatory Visit (INDEPENDENT_AMBULATORY_CARE_PROVIDER_SITE_OTHER): Payer: Medicare Other

## 2018-11-02 ENCOUNTER — Ambulatory Visit: Payer: Medicare Other | Admitting: Podiatry

## 2018-11-02 ENCOUNTER — Encounter: Payer: Self-pay | Admitting: Podiatry

## 2018-11-02 ENCOUNTER — Other Ambulatory Visit: Payer: Self-pay | Admitting: Podiatry

## 2018-11-02 DIAGNOSIS — M779 Enthesopathy, unspecified: Secondary | ICD-10-CM

## 2018-11-02 DIAGNOSIS — M722 Plantar fascial fibromatosis: Secondary | ICD-10-CM

## 2018-11-02 DIAGNOSIS — M79671 Pain in right foot: Secondary | ICD-10-CM

## 2018-11-02 MED ORDER — METHYLPREDNISOLONE 4 MG PO TBPK: ORAL_TABLET | ORAL | 0 refills | Status: DC

## 2018-11-02 MED ORDER — TRIAMCINOLONE ACETONIDE 10 MG/ML IJ SUSP
10.0000 mg | Freq: Once | INTRAMUSCULAR | Status: AC
  Administered 2018-11-02: 10 mg

## 2018-11-05 NOTE — Progress Notes (Signed)
Subjective:   Patient ID: Abigail Shaw, female   DOB: 83 y.o.   MRN: [id number]   HPI Patient presents stating she is having a lot of pain in her entire right foot extending her right ankle.  This is been going on for around a week and she does not remember injury but states it is becoming increasingly hard for her to weight-bear   ROS      Objective:  Physical Exam  Neurovascular status intact with patient's right ankle found to be very tender in the sinus tarsi with moderate swelling of the foot and into the ankle region.  Patient is noted to have good digital perfusion and does have diminished range of motion as she is splinting on the right side.  The pain is most in the sinus tarsi but also noted to be in the entire foot and plantar foot ulcer     Assessment:  Appears to be an acute inflammatory condition with sinus tarsitis that is quite acute in its nature     Plan:  H&P x-ray reviewed and today I did a sterile prep and then injected the sinus tarsi 3 mg Kenalog 5 mg Xylocaine and applied air fracture walker to immobilize the foot due to the fact the entire foot is starting sore and she cannot bear weight on it.  Patient is encouraged to try to leave this on for several weeks and patient will be seen back in 2 weeks or earlier if needed  X-rays were negative for signs of fracture or indications of bony injury

## 2018-11-16 ENCOUNTER — Ambulatory Visit: Payer: Medicare Other | Admitting: Podiatry

## 2019-01-11 ENCOUNTER — Inpatient Hospital Stay: Payer: Medicare Other | Attending: Oncology

## 2019-01-11 ENCOUNTER — Other Ambulatory Visit: Payer: Self-pay

## 2019-01-11 DIAGNOSIS — M810 Age-related osteoporosis without current pathological fracture: Secondary | ICD-10-CM | POA: Diagnosis not present

## 2019-01-11 DIAGNOSIS — Z170 Estrogen receptor positive status [ER+]: Secondary | ICD-10-CM | POA: Diagnosis not present

## 2019-01-11 DIAGNOSIS — I10 Essential (primary) hypertension: Secondary | ICD-10-CM | POA: Diagnosis not present

## 2019-01-11 DIAGNOSIS — C50411 Malignant neoplasm of upper-outer quadrant of right female breast: Secondary | ICD-10-CM | POA: Insufficient documentation

## 2019-01-11 DIAGNOSIS — D509 Iron deficiency anemia, unspecified: Secondary | ICD-10-CM | POA: Diagnosis not present

## 2019-01-11 LAB — CBC WITH DIFFERENTIAL/PLATELET
ABS IMMATURE GRANULOCYTES: 0.02 10*3/uL (ref 0.00–0.07)
BASOS PCT: 1 %
Basophils Absolute: 0 10*3/uL (ref 0.0–0.1)
Eosinophils Absolute: 0.1 10*3/uL (ref 0.0–0.5)
Eosinophils Relative: 1 %
HCT: 38.5 % (ref 36.0–46.0)
HEMOGLOBIN: 12.8 g/dL (ref 12.0–15.0)
IMMATURE GRANULOCYTES: 0 %
LYMPHS PCT: 32 %
Lymphs Abs: 1.9 10*3/uL (ref 0.7–4.0)
MCH: 28.7 pg (ref 26.0–34.0)
MCHC: 33.2 g/dL (ref 30.0–36.0)
MCV: 86.3 fL (ref 80.0–100.0)
MONO ABS: 0.5 10*3/uL (ref 0.1–1.0)
MONOS PCT: 8 %
NEUTROS ABS: 3.5 10*3/uL (ref 1.7–7.7)
NEUTROS PCT: 58 %
PLATELETS: 216 10*3/uL (ref 150–400)
RBC: 4.46 MIL/uL (ref 3.87–5.11)
RDW: 14.6 % (ref 11.5–15.5)
WBC: 5.9 10*3/uL (ref 4.0–10.5)
nRBC: 0 % (ref 0.0–0.2)

## 2019-01-11 LAB — IRON AND TIBC
IRON: 76 ug/dL (ref 28–170)
Saturation Ratios: 25 % (ref 10.4–31.8)
TIBC: 306 ug/dL (ref 250–450)
UIBC: 230 ug/dL

## 2019-01-11 LAB — FERRITIN: Ferritin: 414 ng/mL — ABNORMAL HIGH (ref 11–307)

## 2019-03-11 ENCOUNTER — Ambulatory Visit
Admission: RE | Admit: 2019-03-11 | Discharge: 2019-03-11 | Disposition: A | Discharge status: Home / Self Care | Payer: Medicare Other | Source: Ambulatory Visit | Attending: Oncology | Admitting: Oncology

## 2019-03-11 DIAGNOSIS — C50411 Malignant neoplasm of upper-outer quadrant of right female breast: Secondary | ICD-10-CM | POA: Diagnosis not present

## 2019-03-11 DIAGNOSIS — Z170 Estrogen receptor positive status [ER+]: Secondary | ICD-10-CM | POA: Diagnosis present

## 2019-03-11 DIAGNOSIS — M810 Age-related osteoporosis without current pathological fracture: Secondary | ICD-10-CM | POA: Diagnosis present

## 2019-04-11 ENCOUNTER — Telehealth: Payer: Self-pay | Admitting: Oncology

## 2019-04-12 ENCOUNTER — Other Ambulatory Visit: Payer: Medicare Other

## 2019-04-12 ENCOUNTER — Other Ambulatory Visit: Payer: Self-pay

## 2019-04-12 ENCOUNTER — Inpatient Hospital Stay: Payer: Medicare Other | Attending: Oncology | Admitting: Oncology

## 2019-04-12 ENCOUNTER — Encounter: Payer: Self-pay | Admitting: Oncology

## 2019-04-12 DIAGNOSIS — M810 Age-related osteoporosis without current pathological fracture: Secondary | ICD-10-CM

## 2019-04-12 DIAGNOSIS — Z08 Encounter for follow-up examination after completed treatment for malignant neoplasm: Secondary | ICD-10-CM | POA: Diagnosis not present

## 2019-04-12 DIAGNOSIS — Z853 Personal history of malignant neoplasm of breast: Secondary | ICD-10-CM

## 2019-04-12 DIAGNOSIS — D509 Iron deficiency anemia, unspecified: Secondary | ICD-10-CM

## 2019-04-12 NOTE — Progress Notes (Signed)
Pt says that both of breasts has pain from on and off and the lump she feels she states she was told it was her scar tissue and it has not changed. From anemia -she feels better than she was. She stays at home because of virus and some days she does get tired and takes a nap but she feels like she should stay at home until virus is over

## 2019-04-12 NOTE — Progress Notes (Signed)
I connected with Abigail Shaw on 04/12/19 at 11:15 AM EDT by video enabled telemedicine visit and verified that I am speaking with the correct person using two identifiers.   I discussed the limitations, risks, security and privacy concerns of performing an evaluation and management service by telemedicine and the availability of in-person appointments. I also discussed with the patient that there may be a patient responsible charge related to this service. The patient expressed understanding and agreed to proceed.  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  She did not require adjuvant chemotherapy as she had a low Oncotype score.  She also has a history of osteoporosis for which she is on calcium and vitamin D.  She was previously on Fosamax which was stopped and patient did not want to take any parenteral bisphosphonates.  Patient has a history of iron deficiency anemia.  She has seen GI in the past but could not get through a colonoscopy because of diarrhea  Interval history she reports feeling well today and denies any specific complaints at this time.  Denies any fever, cough, shortness of breath, sore throat.  She has occasional soreness over her lumpectomy site.  Appetite is good and she denies any unintentional weight loss   Review of Systems  Constitutional: Negative for chills, fever, malaise/fatigue and weight loss.  HENT: Negative for congestion, ear discharge and nosebleeds.   Eyes: Negative for blurred vision.  Respiratory: Negative for cough, hemoptysis, sputum production,  shortness of breath and wheezing.   Cardiovascular: Negative for chest pain, palpitations, orthopnea and claudication.  Gastrointestinal: Negative for abdominal pain, blood in stool, constipation, diarrhea, heartburn, melena, nausea and vomiting.  Genitourinary: Negative for dysuria, flank pain, frequency, hematuria and urgency.  Musculoskeletal: Negative for back pain, joint pain and myalgias.  Skin: Negative for rash.  Neurological: Negative for dizziness, tingling, focal weakness, seizures, weakness and headaches.  Endo/Heme/Allergies: Does not bruise/bleed easily.  Psychiatric/Behavioral: Negative for depression and suicidal ideas. The patient does not have insomnia.     Allergies  Allergen Reactions  . Lisinopril Cough       . Simbrinza [Brinzolamide-Brimonidine]     lethargy  . Sulfamethoxazole-Trimethoprim     Other reaction(s): Other (See Comments) HYPONATREMIA  . Tape Other (See Comments)    Redness and irritation    Past Medical History:  Diagnosis Date  . Arthritis    joints  . Breast cyst   . Cancer ([place]) 2014   right breast cancer and radiation  . Complication of anesthesia    difficulty waking up-quit breathing during first TDC, bruising on jaw after surgery  . Depression   . GERD (gastroesophageal reflux disease)   . Glaucoma   . Headache    migraines, none for awhile  . History of hiatal hernia   . Hypercholesteremia   . Hypertension    controlled on meds  . IBS (irritable bowel syndrome)   . Joint pain   . Motion sickness    ferry, cars  . Osteoporosis   . Personal history of radiation therapy 2014   Right breast  . Shingles 2012?  right side  . Wears dentures    partial upper (1 tooth)    Past Surgical History:  Procedure Laterality Date  . ABDOMINAL HYSTERECTOMY  1976  . BREAST BIOPSY Right 2014   Invasive Carcinoma  . BREAST LUMPECTOMY Right 2014   + mammo cite rad and aromasin  . BREAST MAMMOSITE Right 08-01-13  . BREAST SURGERY Right  07-02-2013   with sentinal biopsy  . BREAST SURGERY Right 07-18-13  . CARDIAC CATHETERIZATION    . CATARACT EXTRACTION W/ INTRAOCULAR LENS  IMPLANT, BILATERAL    . COLONOSCOPY     Dr Candace Cruise  . ESOPHAGOGASTRODUODENOSCOPY (EGD) WITH PROPOFOL N/A 04/04/2018   Procedure: ESOPHAGOGASTRODUODENOSCOPY (EGD) WITH PROPOFOL;  Surgeon: Toledo, Benay Pike, MD;  Location: [facility];  Service: Gastroenterology;  Laterality: N/A;  . EXCISION VAGINAL CYST    . FOOT SURGERY Left 12/05/14  . HAND SURGERY     trigger finger  . MM BREAST STEREO BX*R*R/S Right 2014  . PHOTOCOAGULATION Left 05/16/2018   Procedure: TRANSCLERAL DIODE CYCLOPHOTOCOAGULATION LEFT IVA BLOCK;  Surgeon: Leandrew Koyanagi, MD;  Location: [place];  Service: Ophthalmology;  Laterality: Left;  IVA BLOCK LEFT  . PHOTOCOAGULATION WITH LASER Left 08/22/2016   Procedure: PHOTOCOAGULATION WITH LASER;  Surgeon: Ronnell Freshwater, MD;  Location: [place];  Service: Ophthalmology;  Laterality: Left;  KEEP PT 2ND    Social History   Socioeconomic History  . Marital status: Married    Spouse name: Not on file  . Number of children: 5  . Years of education: 56  . Highest education level: Not on file  Occupational History  . Occupation: Retired  Scientific laboratory technician  . Financial resource strain: Not on file  . Food insecurity:    Worry: Not on file    Inability: Not on file  . Transportation needs:    Medical: Not on file    Non-medical: Not on file  Tobacco Use  . Smoking status: Never Smoker  . Smokeless tobacco: Never Used  Substance and Sexual Activity  . Alcohol use: No  . Drug use: No  . Sexual activity: Not on file  Lifestyle  . Physical activity:    Days per week: Not on file    Minutes per session: Not on file  . Stress: Not on file  Relationships  . Social connections:    Talks on phone: Not on file    Gets together: Not on file    Attends religious service: Not on file    Active member of club  or organization: Not on file    Attends meetings of clubs or organizations: Not on file    Relationship status: Not on file  . Intimate partner violence:    Fear of current or ex partner: Not on file    Emotionally abused: Not on file    Physically abused: Not on file    Forced sexual activity: Not on file  Other Topics Concern  . Not on file  Social History Narrative   Lives with husband   Caffeine use:  none    Family History  Problem Relation Age of Onset  . Cancer Mother        age 59 breast  . Breast cancer Mother 67  . Cancer Brother        prostate/lung/ liver /spleen  . Breast cancer Maternal Aunt        age 85  . Colon cancer Paternal Aunt      Current  Outpatient Medications:  .  amLODipine (NORVASC) 2.5 MG tablet, Take 2.5 mg by mouth daily. am, Disp: , Rfl:  .  Biotin 1000 MCG tablet, Take 1,000 mcg by mouth daily. am, Disp: , Rfl:  .  cetirizine (ZYRTEC) 10 MG tablet, Take 10 mg by mouth every evening. , Disp: , Rfl:  .  dorzolamide-timolol (COSOPT) 22.3-6.8 MG/ML ophthalmic solution, Place 1 drop into both eyes 2 (two) times daily. , Disp: , Rfl:  .  FLUoxetine (PROZAC) 20 MG capsule, Take 20 mg by mouth daily. am, Disp: , Rfl:  .  fluticasone (FLONASE) 50 MCG/ACT nasal spray, Place 2 sprays into both nostrils daily. am, Disp: , Rfl:  .  LORazepam (ATIVAN) 0.5 MG tablet, Take 0.5 mg by mouth at bedtime. And as needed, Disp: , Rfl:  .  losartan (COZAAR) 50 MG tablet, Take 50 mg by mouth every evening. , Disp: , Rfl:  .  pantoprazole (PROTONIX) 40 MG tablet, Take 40 mg by mouth 2 (two) times daily. Am and pm, Disp: , Rfl:  .  rizatriptan (MAXALT) 10 MG tablet, Take 10 mg by mouth as needed for migraine. May repeat in 2 hours if needed, Disp: , Rfl:  .  rosuvastatin (CRESTOR) 5 MG tablet, Take 5 mg by mouth every other day. , Disp: , Rfl:  .  Travoprost, BAK Free, (TRAVATAN) 0.004 % SOLN ophthalmic solution, Place 1 drop into both eyes at bedtime., Disp: , Rfl:  .   Vitamin D, Ergocalciferol, (DRISDOL) 50000 UNITS CAPS capsule, Take 1 capsule by mouth once a week., Disp: , Rfl:   No results found.  No images are attached to the encounter.   CMP Latest Ref Rng & Units 10/19/2016  Glucose 65 - 99 mg/dL 99  BUN 6 - 20 mg/dL 11  Creatinine 0.44 - 1.00 mg/dL 0.73  Sodium 135 - 145 mmol/L 136  Potassium 3.5 - 5.1 mmol/L 3.9  Chloride 101 - 111 mmol/L 104  CO2 22 - 32 mmol/L 26  Calcium 8.9 - 10.3 mg/dL 9.9  Total Protein 6.5 - 8.1 g/dL 7.3  Total Bilirubin 0.3 - 1.2 mg/dL 0.3  Alkaline Phos 38 - 126 U/L 87  AST 15 - 41 U/L 26  ALT 14 - 54 U/L 18   CBC Latest Ref Rng & Units 01/11/2019  WBC 4.0 - 10.5 K/uL 5.9  Hemoglobin 12.0 - 15.0 g/dL 12.8  Hematocrit 36.0 - 46.0 % 38.5  Platelets 150 - 400 K/uL 216     Observation/objective: Patient appears in no acute distress over the video visit today.  Breathing is nonlabored  Assessment and plan: Patient is 84 year old female with following issues:  1.  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  We have scheduled a mammogram for her in June 2020.  Clinically there are no signs and symptoms concerning for recurrence.  2.  Osteoporosis: I have encouraged her to keep up with her calcium and vitamin D.  Recent bone density scan in March 2020 showed persistent osteoporosis but no overt worsening.  3. Iron deficiency anemia: Her iron studies are normal today and her hemoglobin is 12.8.  She does not require any IV iron at this time.  Follow-up instructions: Repeat CBC ferritin and iron studies in 3 in 6 months and I will see her back in 6 months  I discussed the assessment and treatment plan with  the patient. The patient was provided an opportunity to ask questions and all were answered. The patient agreed with the plan and demonstrated an understanding of the instructions.   The patient was  advised to call back or seek an in-person evaluation if the symptoms worsen or if the condition fails to improve as anticipated.   Visit Diagnosis: 1. Iron deficiency anemia, unspecified iron deficiency anemia type   2. Encounter for follow-up surveillance of breast cancer   3. Osteoporosis without current pathological fracture, unspecified osteoporosis type     Dr. [name], MD, MPH [facility] at [facility] Pager- [phone] 04/12/2019 12:56 PM

## 2019-06-03 ENCOUNTER — Ambulatory Visit
Admission: RE | Admit: 2019-06-03 | Discharge: 2019-06-03 | Disposition: A | Discharge status: Home / Self Care | Payer: Medicare Other | Source: Ambulatory Visit | Attending: Oncology | Admitting: Oncology

## 2019-06-03 ENCOUNTER — Other Ambulatory Visit: Payer: Self-pay

## 2019-06-03 DIAGNOSIS — Z1231 Encounter for screening mammogram for malignant neoplasm of breast: Secondary | ICD-10-CM | POA: Diagnosis not present

## 2019-06-03 DIAGNOSIS — C50411 Malignant neoplasm of upper-outer quadrant of right female breast: Secondary | ICD-10-CM | POA: Diagnosis not present

## 2019-06-03 DIAGNOSIS — Z170 Estrogen receptor positive status [ER+]: Secondary | ICD-10-CM | POA: Insufficient documentation

## 2019-06-21 ENCOUNTER — Other Ambulatory Visit
Admission: RE | Admit: 2019-06-21 | Discharge: 2019-06-21 | Disposition: A | Discharge status: Home / Self Care | Payer: Medicare Other | Source: Ambulatory Visit | Attending: Ophthalmology | Admitting: Ophthalmology

## 2019-06-21 ENCOUNTER — Other Ambulatory Visit: Payer: Self-pay

## 2019-06-21 DIAGNOSIS — Z1159 Encounter for screening for other viral diseases: Secondary | ICD-10-CM | POA: Diagnosis not present

## 2019-06-22 LAB — NOVEL CORONAVIRUS, NAA (HOSP ORDER, SEND-OUT TO REF LAB; TAT 18-24 HRS): SARS-CoV-2, NAA: NOT DETECTED

## 2019-06-25 NOTE — Discharge Instructions (Signed)

## 2019-06-26 ENCOUNTER — Ambulatory Visit
Admission: RE | Admit: 2019-06-26 | Discharge: 2019-06-26 | Disposition: A | Discharge status: Home / Self Care | Payer: Medicare Other | Attending: Ophthalmology | Admitting: Ophthalmology

## 2019-06-26 ENCOUNTER — Ambulatory Visit: Payer: Medicare Other | Admitting: Anesthesiology

## 2019-06-26 ENCOUNTER — Encounter
Admission: RE | Disposition: A | Discharge status: Home / Self Care | Payer: Self-pay | Source: Home / Self Care | Attending: Ophthalmology

## 2019-06-26 ENCOUNTER — Other Ambulatory Visit: Payer: Self-pay

## 2019-06-26 DIAGNOSIS — Z79899 Other long term (current) drug therapy: Secondary | ICD-10-CM | POA: Diagnosis not present

## 2019-06-26 DIAGNOSIS — M1990 Unspecified osteoarthritis, unspecified site: Secondary | ICD-10-CM | POA: Diagnosis not present

## 2019-06-26 DIAGNOSIS — F329 Major depressive disorder, single episode, unspecified: Secondary | ICD-10-CM | POA: Insufficient documentation

## 2019-06-26 DIAGNOSIS — Z853 Personal history of malignant neoplasm of breast: Secondary | ICD-10-CM | POA: Diagnosis not present

## 2019-06-26 DIAGNOSIS — K219 Gastro-esophageal reflux disease without esophagitis: Secondary | ICD-10-CM | POA: Insufficient documentation

## 2019-06-26 DIAGNOSIS — E7800 Pure hypercholesterolemia, unspecified: Secondary | ICD-10-CM | POA: Diagnosis not present

## 2019-06-26 DIAGNOSIS — I10 Essential (primary) hypertension: Secondary | ICD-10-CM | POA: Diagnosis not present

## 2019-06-26 DIAGNOSIS — H401122 Primary open-angle glaucoma, left eye, moderate stage: Secondary | ICD-10-CM | POA: Diagnosis present

## 2019-06-26 HISTORY — DX: Iron deficiency anemia, unspecified: D50.9

## 2019-06-26 HISTORY — PX: CATARACT EXTRACTION W/PHACO: SHX586

## 2019-06-26 SURGERY — PHACOEMULSIFICATION, CATARACT, WITH IOL INSERTION
Anesthesia: Monitor Anesthesia Care | Site: Eye | Laterality: Left

## 2019-06-26 MED ORDER — FENTANYL CITRATE (PF) 100 MCG/2ML IJ SOLN
INTRAMUSCULAR | Status: DC | PRN
  Administered 2019-06-26: 50 ug via INTRAVENOUS

## 2019-06-26 MED ORDER — TETRACAINE HCL 0.5 % OP SOLN
1.0000 [drp] | OPHTHALMIC | Status: DC | PRN
  Administered 2019-06-26 (×2): 1 [drp] via OPHTHALMIC

## 2019-06-26 MED ORDER — TRYPAN BLUE 0.06 % OP SOLN
OPHTHALMIC | Status: DC | PRN
  Administered 2019-06-26: 0.5 mL via INTRAOCULAR

## 2019-06-26 MED ORDER — CEFUROXIME OPHTHALMIC INJECTION 1 MG/0.1 ML
INJECTION | OPHTHALMIC | Status: DC | PRN
  Administered 2019-06-26: 0.1 mL via INTRACAMERAL

## 2019-06-26 MED ORDER — ONDANSETRON HCL 4 MG/2ML IJ SOLN: 4.0000 mg | Freq: Once | INTRAMUSCULAR | Status: DC | PRN

## 2019-06-26 MED ORDER — MOXIFLOXACIN HCL 0.5 % OP SOLN
1.0000 [drp] | OPHTHALMIC | Status: DC | PRN
  Administered 2019-06-26 (×3): 1 [drp] via OPHTHALMIC

## 2019-06-26 MED ORDER — LIDOCAINE HCL (PF) 2 % IJ SOLN
INTRAOCULAR | Status: DC | PRN
  Administered 2019-06-26: 15:00:00 1 mL

## 2019-06-26 MED ORDER — EPINEPHRINE PF 1 MG/ML IJ SOLN
INTRAOCULAR | Status: DC | PRN
  Administered 2019-06-26: 15:00:00 28 mL via OPHTHALMIC

## 2019-06-26 MED ORDER — LACTATED RINGERS IV SOLN: INTRAVENOUS | Status: DC

## 2019-06-26 MED ORDER — NA HYALUR & NA CHOND-NA HYALUR 0.4-0.35 ML IO KIT
PACK | INTRAOCULAR | Status: DC | PRN
  Administered 2019-06-26: 1 mL via INTRAOCULAR

## 2019-06-26 MED ORDER — MIDAZOLAM HCL 2 MG/2ML IJ SOLN
INTRAMUSCULAR | Status: DC | PRN
  Administered 2019-06-26: 1 mg via INTRAVENOUS

## 2019-06-26 SURGICAL SUPPLY — 26 items
BLADE DUAL KAHOOK SINGLE USE (BLADE) ×2 IMPLANT
CANNULA ANT/CHMB 27G (MISCELLANEOUS) ×1 IMPLANT
CANNULA ANT/CHMB 27GA (MISCELLANEOUS) ×3 IMPLANT
GLOVE SURG LX 7.5 STRW (GLOVE) ×4
GLOVE SURG LX STRL 7.5 STRW (GLOVE) ×1 IMPLANT
GLOVE SURG TRIUMPH 8.0 PF LTX (GLOVE) ×3 IMPLANT
GOWN STRL REUS W/ TWL LRG LVL3 (GOWN DISPOSABLE) ×2 IMPLANT
GOWN STRL REUS W/TWL LRG LVL3 (GOWN DISPOSABLE) ×4
MARKER SKIN DUAL TIP RULER LAB (MISCELLANEOUS) ×3 IMPLANT
NDL FILTER BLUNT 18X1 1/2 (NEEDLE) ×1 IMPLANT
NDL RETROBULBAR .5 NSTRL (NEEDLE) IMPLANT
NEEDLE FILTER BLUNT 18X 1/2SAF (NEEDLE) ×2
NEEDLE FILTER BLUNT 18X1 1/2 (NEEDLE) ×1 IMPLANT
PACK CATARACT BRASINGTON (MISCELLANEOUS) ×3 IMPLANT
PACK EYE AFTER SURG (MISCELLANEOUS) ×3 IMPLANT
PACK OPTHALMIC (MISCELLANEOUS) ×3 IMPLANT
RING MALYGIN 7.0 (MISCELLANEOUS) IMPLANT
SUT ETHILON 10-0 CS-B-6CS-B-6 (SUTURE)
SUT VICRYL  9 0 (SUTURE)
SUT VICRYL 9 0 (SUTURE) IMPLANT
SUTURE EHLN 10-0 CS-B-6CS-B-6 (SUTURE) IMPLANT
SYR 3ML LL SCALE MARK (SYRINGE) ×3 IMPLANT
SYR 5ML LL (SYRINGE) ×3 IMPLANT
SYR TB 1ML LUER SLIP (SYRINGE) ×3 IMPLANT
WATER STERILE IRR 500ML POUR (IV SOLUTION) ×3 IMPLANT
WIPE NON LINTING 3.25X3.25 (MISCELLANEOUS) ×3 IMPLANT

## 2019-06-26 NOTE — Transfer of Care (Signed)
Immediate Anesthesia Transfer of Care Note  Patient: Abigail Shaw  Procedure(s) Performed: [place] DUAL BLADE GONIOTOMY LEFT (Left Eye)  Patient Location: PACU  Anesthesia Type: MAC  Level of Consciousness: awake, alert  and patient cooperative  Airway and Oxygen Therapy: Patient Spontanous Breathing and Patient connected to supplemental oxygen  Post-op Assessment: Post-op Vital signs reviewed, Patient's Cardiovascular Status Stable, Respiratory Function Stable, Patent Airway and No signs of Nausea or vomiting  Post-op Vital Signs: Reviewed and stable  Complications: No apparent anesthesia complications

## 2019-06-26 NOTE — Anesthesia Preprocedure Evaluation (Signed)
Anesthesia Evaluation  Patient identified by MRN, date of birth, ID band Patient awake    Reviewed: Allergy & Precautions, NPO status , Patient's Chart, lab work & pertinent test results  History of Anesthesia Complications (+) history of anesthetic complications (Pt with jaw bruises after endoscopy in 03/2018)  Airway Mallampati: III  TM Distance: >3 FB     Dental  (+) Partial Upper   Pulmonary neg pulmonary ROS,    breath sounds clear to auscultation       Cardiovascular Exercise Tolerance: Good hypertension,  Rhythm:Regular Rate:Normal     Neuro/Psych  Headaches, PSYCHIATRIC DISORDERS Depression    GI/Hepatic hiatal hernia, GERD  Medicated and Controlled,IBS   Endo/Other    Renal/GU      Musculoskeletal  (+) Arthritis , Osteoarthritis,    Abdominal   Peds  Hematology  (+) Blood dyscrasia, anemia , Breast CA   Anesthesia Other Findings   Reproductive/Obstetrics                             Anesthesia Physical  Anesthesia Plan  ASA: III  Anesthesia Plan: MAC   Post-op Pain Management:    Induction: Intravenous  PONV Risk Score and Plan: 2  Airway Management Planned: Natural Airway and Nasal Cannula  Additional Equipment:   Intra-op Plan:   Post-operative Plan:   Informed Consent: I have reviewed the patients History and Physical, chart, labs and discussed the procedure including the risks, benefits and alternatives for the proposed anesthesia with the patient or authorized representative who has indicated his/her understanding and acceptance.       Plan Discussed with: CRNA  Anesthesia Plan Comments:         Anesthesia Quick Evaluation

## 2019-06-26 NOTE — Anesthesia Procedure Notes (Signed)
Procedure Name: [place] Performed by: Cameron Ali, CRNA Pre-anesthesia Checklist: Patient identified, Emergency Drugs available, Suction available, Timeout performed and Patient being monitored Patient Re-evaluated:Patient Re-evaluated prior to induction Oxygen Delivery Method: Nasal cannula Placement Confirmation: positive ETCO2

## 2019-06-26 NOTE — Op Note (Signed)
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    COMPLICATIONS:  None.   DESCRIPTION OF PROCEDURE:  The patient was identified in the holding room and transported to the operating room and placed in the supine position under the operating microscope.  The left eye was identified as the operative eye and it was prepped and draped in the usual sterile ophthalmic fashion.   A 1 millimeter clear-corneal paracentesis was made at the 5:30 position.  0.5 ml of preservative-free 1% lidocaine was injected into the anterior chamber.  The anterior chamber was filled with Vision blue dye followed by balanced salt. Viscoat viscoelastic was then placed intot he anterior chamber.  A 2.4 millimeter keratome was used to make a near-clear corneal incision at the 2:30 position. The microscope was adjusted and a gonioprism was used to visulaize the trabecular meshwork.  The [facility] Dual Blade was advanced across the anterior chamber under viscoelastic.  The blade was used to mark the trabecular meshwork at the 7:30 position.  The blade was placed two clock hours clockwise into the meshwork.  Proper postioning was confirmed.  The blade was passed counterclockwise through the meshwork to excise approximately two clock-hours of trabecular meshwork.   The remaining viscoelastic was aspirated.   Wounds were hydrated with balanced salt solution.  The anterior chamber was inflated to a physiologic pressure with balanced salt solution.  No wound leaks were noted. Cefuroxime 0.1 ml of a 10mg /ml solution was injected into the anterior chamber for a dose of 1 mg of intracameral antibiotic at the completion of  the case.  The patient was taken to the recovery room in stable condition without complications of anesthesia or surgery.

## 2019-06-26 NOTE — Anesthesia Postprocedure Evaluation (Signed)
Anesthesia Post Note  Patient: Abigail Shaw  Procedure(s) Performed: [place] DUAL BLADE GONIOTOMY LEFT (Left Eye)  Patient location during evaluation: PACU Anesthesia Type: MAC Level of consciousness: awake and alert Pain management: pain level controlled Vital Signs Assessment: post-procedure vital signs reviewed and stable Respiratory status: spontaneous breathing, nonlabored ventilation, respiratory function stable and patient connected to nasal cannula oxygen Cardiovascular status: stable and blood pressure returned to baseline Postop Assessment: no apparent nausea or vomiting Anesthetic complications: no    Veda Canning

## 2019-06-26 NOTE — H&P (Signed)

## 2019-06-27 ENCOUNTER — Encounter: Payer: Self-pay | Admitting: Ophthalmology

## 2019-07-11 ENCOUNTER — Ambulatory Visit: Payer: Medicare Other | Admitting: General Surgery

## 2019-07-12 ENCOUNTER — Inpatient Hospital Stay: Payer: Medicare Other | Attending: Oncology

## 2019-08-21 IMAGING — US US BREAST*R* LIMITED INC AXILLA
1 series · 6 of 6 positions shown · non-contrast
Comparison: Previous exam(s).

CLINICAL DATA: 82-year-old female with history of right breast
cancer post lumpectomy 2782. Diagnostic mammography performed today
was negative, however when the patient was given results for her
diagnostic mammography she expressed concern regarding a palpable
abnormality in the upper-outer right breast and therefore given
patient concern ultrasound will be performed.

EXAM:
ULTRASOUND OF THE RIGHT BREAST

[Series 1: us breast*right* limited inc axilla · 0.06mm/px · 6 of 6 slices shown]
[im 1/6]
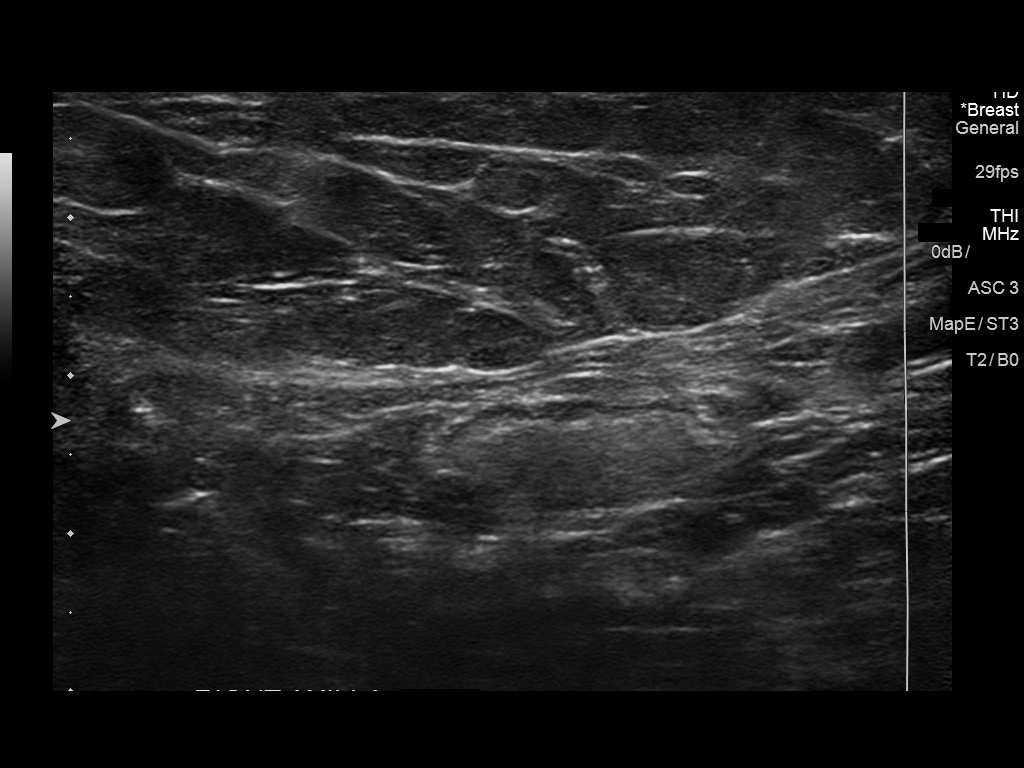
[im 2/6]
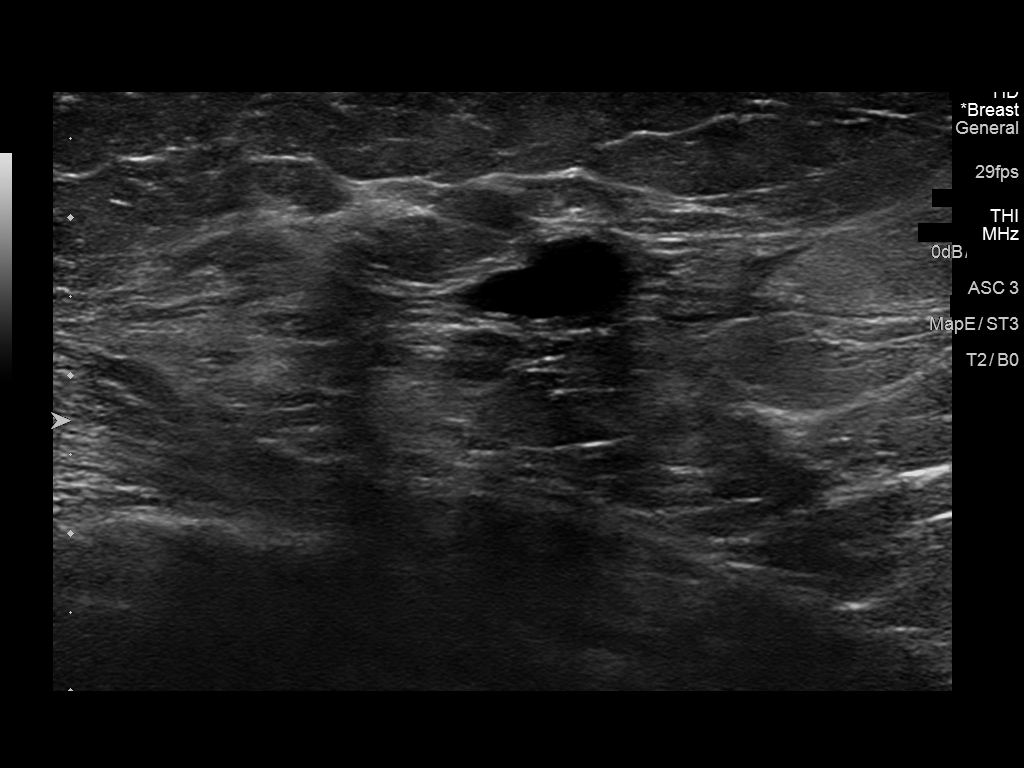
[im 3/6]
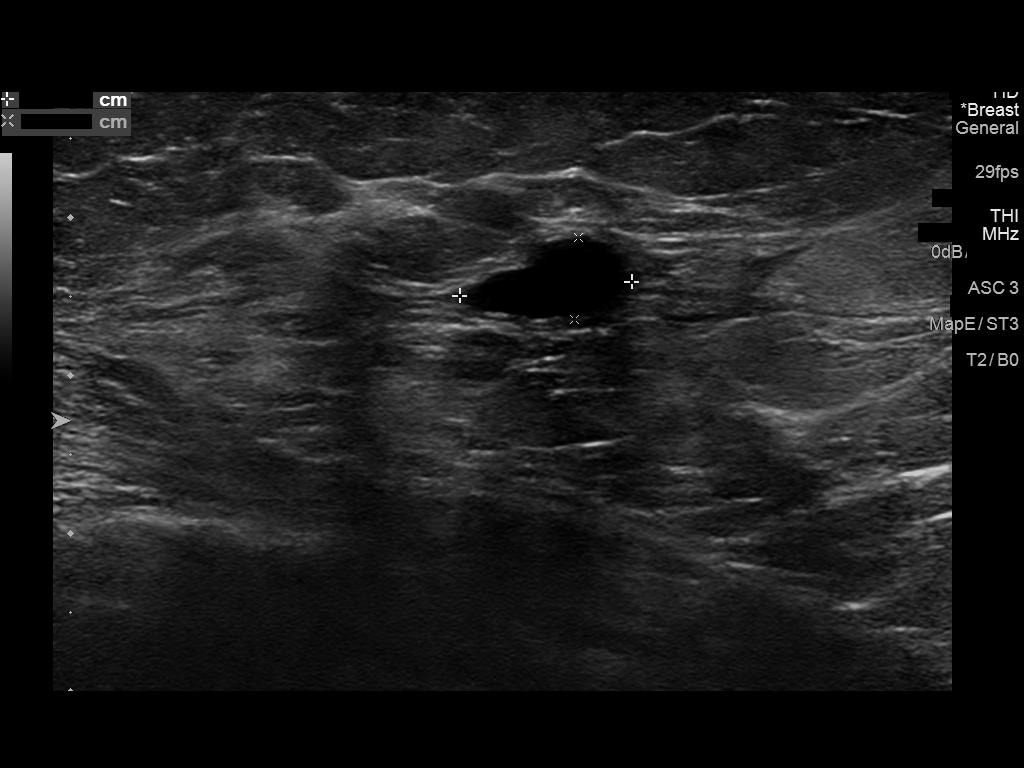
[im 4/6]
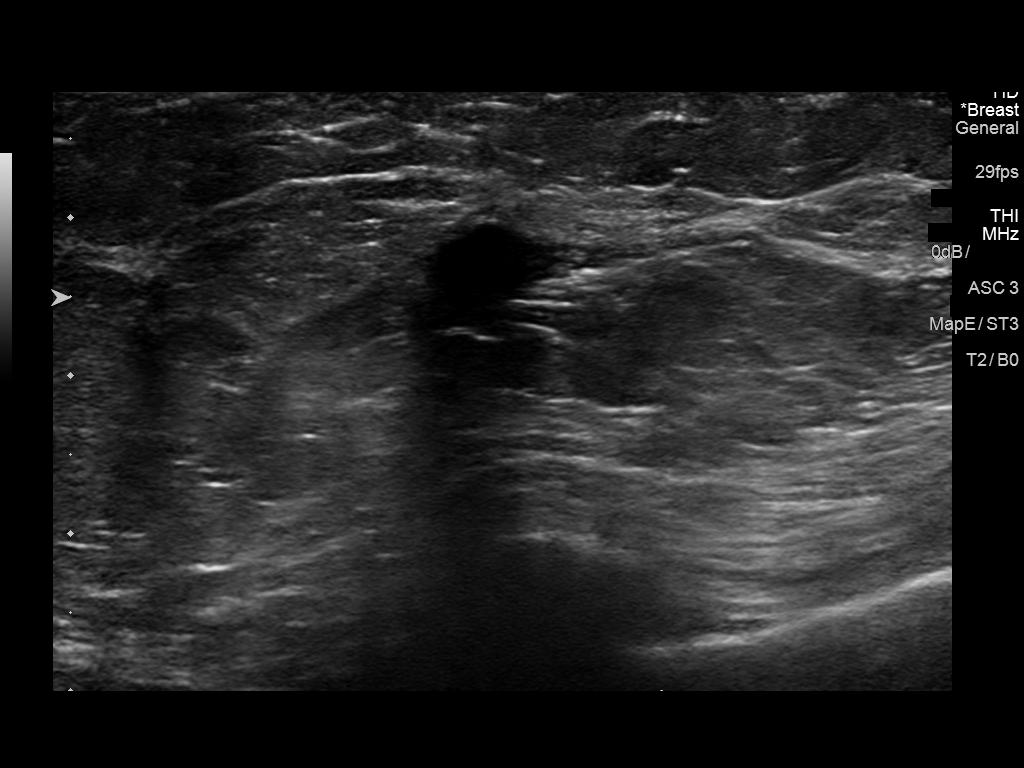
[im 5/6]
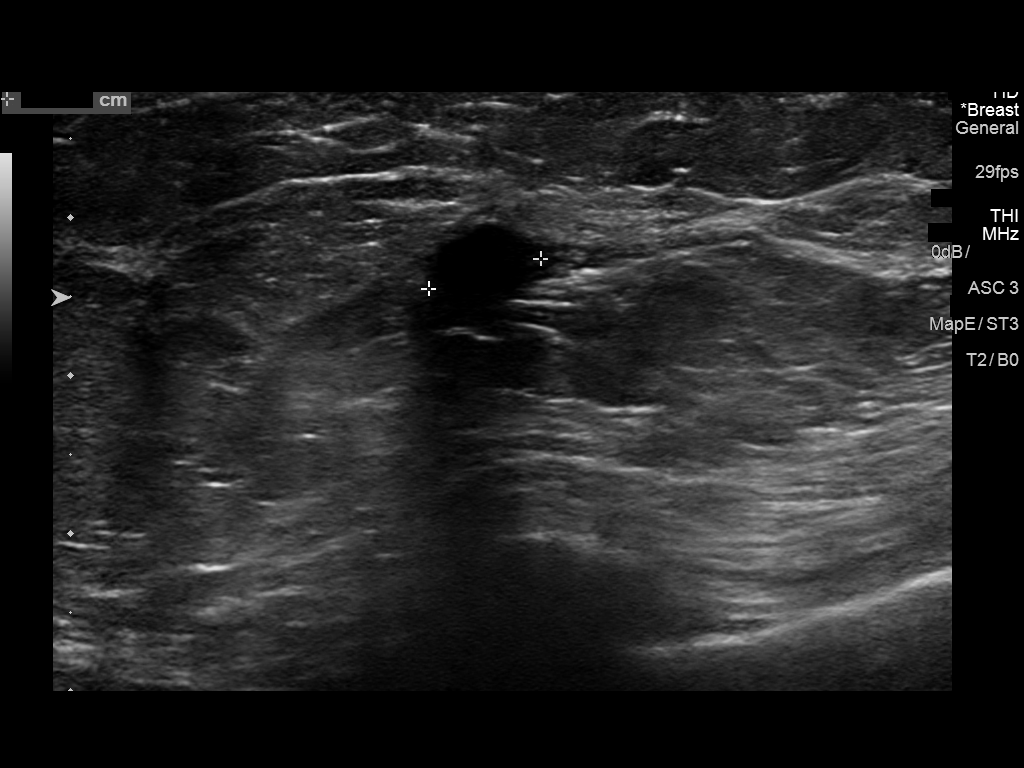
[im 6/6]
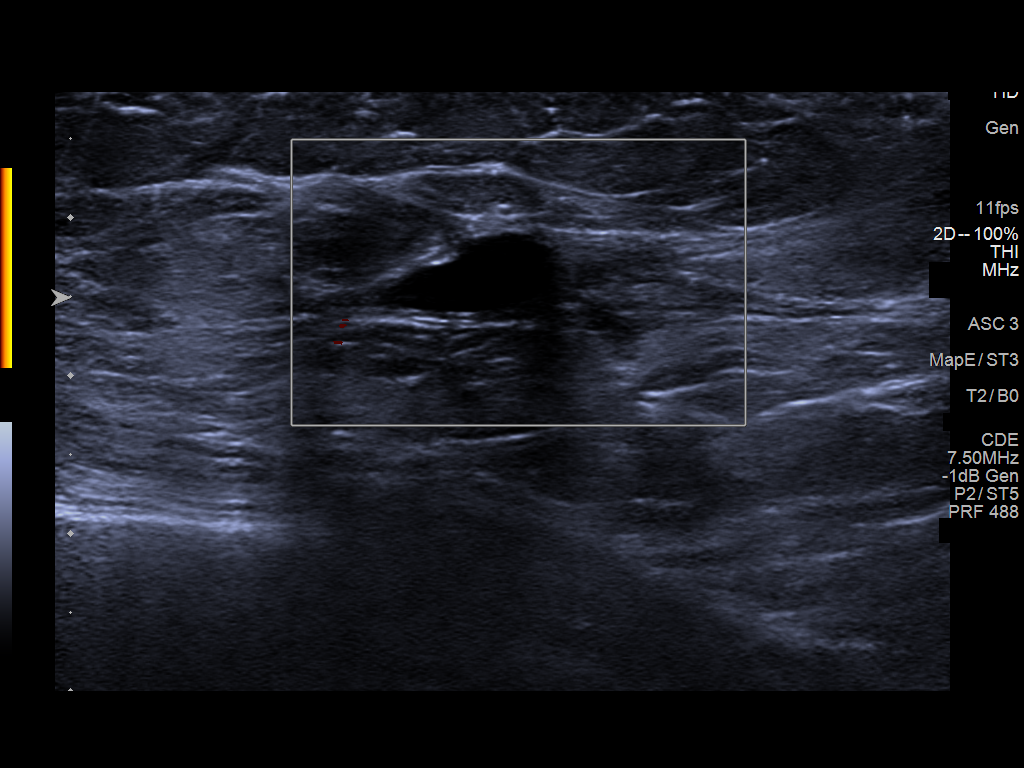

[6 of 6 positions shown; findings below may reference images not displayed]

FINDINGS: Physical examination at site of palpable concern reveals firmness
just superior to the lumpectomy scar in the upper-outer right
breast.

Targeted ultrasound at site of palpable concern in the upper-outer
right breast was performed demonstrating a small fluid collection at
the [DATE] position 9 cm from nipple in the region of patient's scar
measuring 1.1 x 0.5 x 0.7 cm, stable to slightly smaller in size
when compared to prior ultrasound dated 04/14/2017 and much smaller
when compared to prior ultrasound from 2782. No suspicious masses or
abnormality seen sonographically.
IMPRESSION: Small residual fluid collection at surgical site in the upper-outer
right breast, stable and benign in appearance. No sonographic
findings of malignancy.

RECOMMENDATION:
Recommend annual routine screening mammography.

I have discussed the findings and recommendations with the patient.
Results were also provided in writing at the conclusion of the
visit. If applicable, a reminder letter will be sent to the patient
regarding the next appointment.

BI-RADS CATEGORY  2: Benign.

## 2019-09-26 NOTE — Progress Notes (Signed)
Patient is coming in for follow up she mentions not feeling well today, she would like to address appetite, pain in upper back and change in GI system   Patient questioning about blood work

## 2019-09-27 ENCOUNTER — Inpatient Hospital Stay: Payer: Medicare Other

## 2019-09-27 ENCOUNTER — Inpatient Hospital Stay: Payer: Medicare Other | Attending: Oncology | Admitting: Oncology

## 2019-09-27 ENCOUNTER — Other Ambulatory Visit: Payer: Self-pay

## 2019-09-27 ENCOUNTER — Telehealth: Payer: Self-pay | Admitting: *Deleted

## 2019-09-27 DIAGNOSIS — Z923 Personal history of irradiation: Secondary | ICD-10-CM | POA: Insufficient documentation

## 2019-09-27 DIAGNOSIS — M810 Age-related osteoporosis without current pathological fracture: Secondary | ICD-10-CM | POA: Diagnosis not present

## 2019-09-27 DIAGNOSIS — M549 Dorsalgia, unspecified: Secondary | ICD-10-CM | POA: Diagnosis not present

## 2019-09-27 DIAGNOSIS — D509 Iron deficiency anemia, unspecified: Secondary | ICD-10-CM | POA: Diagnosis present

## 2019-09-27 DIAGNOSIS — M546 Pain in thoracic spine: Secondary | ICD-10-CM | POA: Diagnosis not present

## 2019-09-27 DIAGNOSIS — Z853 Personal history of malignant neoplasm of breast: Secondary | ICD-10-CM | POA: Insufficient documentation

## 2019-09-27 LAB — COMPREHENSIVE METABOLIC PANEL
ALT: 22 U/L (ref 0–44)
AST: 24 U/L (ref 15–41)
Albumin: 4.1 g/dL (ref 3.5–5.0)
Alkaline Phosphatase: 88 U/L (ref 38–126)
Anion gap: 8 (ref 5–15)
BUN: 15 mg/dL (ref 8–23)
CO2: 27 mmol/L (ref 22–32)
Calcium: 10 mg/dL (ref 8.9–10.3)
Chloride: 99 mmol/L (ref 98–111)
Creatinine, Ser: 0.76 mg/dL (ref 0.44–1.00)
GFR calc Af Amer: >60 mL/min (ref >60)
GFR calc non Af Amer: >60 mL/min (ref >60)
Glucose, Bld: 95 mg/dL (ref 70–99)
Potassium: 4 mmol/L (ref 3.5–5.1)
Sodium: 134 mmol/L — ABNORMAL LOW (ref 135–145)
Total Bilirubin: 0.5 mg/dL (ref 0.3–1.2)
Total Protein: 6.9 g/dL (ref 6.5–8.1)

## 2019-09-27 LAB — CBC WITH DIFFERENTIAL/PLATELET
Abs Immature Granulocytes: 0.01 10*3/uL (ref 0.00–0.07)
Basophils Absolute: 0 10*3/uL (ref 0.0–0.1)
Basophils Relative: 1 %
Eosinophils Absolute: 0.1 10*3/uL (ref 0.0–0.5)
Eosinophils Relative: 2 %
HCT: 36.6 % (ref 36.0–46.0)
Hemoglobin: 12.2 g/dL (ref 12.0–15.0)
Immature Granulocytes: 0 %
Lymphocytes Relative: 32 %
Lymphs Abs: 1.7 10*3/uL (ref 0.7–4.0)
MCH: 29.1 pg (ref 26.0–34.0)
MCHC: 33.3 g/dL (ref 30.0–36.0)
MCV: 87.4 fL (ref 80.0–100.0)
Monocytes Absolute: 0.5 10*3/uL (ref 0.1–1.0)
Monocytes Relative: 9 %
Neutro Abs: 2.9 10*3/uL (ref 1.7–7.7)
Neutrophils Relative %: 56 %
Platelets: 230 10*3/uL (ref 150–400)
RBC: 4.19 MIL/uL (ref 3.87–5.11)
RDW: 12.9 % (ref 11.5–15.5)
WBC: 5.1 10*3/uL (ref 4.0–10.5)
nRBC: 0 % (ref 0.0–0.2)

## 2019-09-27 LAB — FERRITIN: Ferritin: 356 ng/mL — ABNORMAL HIGH (ref 11–307)

## 2019-09-27 LAB — IRON AND TIBC
Iron: 78 ug/dL (ref 28–170)
Saturation Ratios: 26 % (ref 10.4–31.8)
TIBC: 301 ug/dL (ref 250–450)
UIBC: 223 ug/dL

## 2019-09-27 NOTE — Telephone Encounter (Signed)
Called Abigail Shaw to let her know that her mri is October 23 at 10 am for thoracic and 11 am for the lumbar. I will mail it as Abigail Shaw. Requested.

## 2019-09-29 ENCOUNTER — Encounter: Payer: Self-pay | Admitting: Oncology

## 2019-09-29 NOTE — Progress Notes (Signed)
Hematology/Oncology Consult note [facility]  Telephone:([phone] [phone]  Patient Care Team: [name], MD as PCP - General (Family Medicine) Christene Lye, MD (General Surgery) [name], MD as Referring Physician (Family Medicine)   Name of the patient: Abigail Shaw  [id number]  [birth year]   Date of visit: 09/29/19  Diagnosis- 1. Iron deficiency anemia 2. 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  She did not require adjuvant chemotherapy as she had a low Oncotype score.  She also has a history of osteoporosis for which she is on calcium and vitamin D.  She was previously on Fosamax which was stopped and patient did not want to take any parenteral bisphosphonates.  Patient has a history of iron deficiency anemia.  She has seen GI in the past but could not get through a colonoscopy because of diarrhea  Interval history- Patient complains of back pain which started about 6 weeks ago. It radiates around her chest wall and at times it is quite severe. She has been motrin and topical gel but symptoms have not improved  ECOG PS- 1 Pain scale-4 Opioid associated constipation- no  Review of systems- Review of Systems  Constitutional: Positive for malaise/fatigue. Negative for chills, fever and weight loss.  HENT: Negative for congestion, ear discharge and nosebleeds.   Eyes: Negative for blurred vision.  Respiratory: Negative for cough, hemoptysis, sputum production, shortness of breath and wheezing.   Cardiovascular: Negative for chest pain, palpitations, orthopnea and claudication.  Gastrointestinal: Negative for abdominal pain,  blood in stool, constipation, diarrhea, heartburn, melena, nausea and vomiting.  Genitourinary: Negative for dysuria, flank pain, frequency, hematuria and urgency.  Musculoskeletal: Positive for back pain. Negative for joint pain and myalgias.  Skin: Negative for rash.  Neurological: Negative for dizziness, tingling, focal weakness, seizures, weakness and headaches.  Endo/Heme/Allergies: Does not bruise/bleed easily.  Psychiatric/Behavioral: Negative for depression and suicidal ideas. The patient does not have insomnia.       Allergies  Allergen Reactions  . Lisinopril Cough       . Simbrinza [Brinzolamide-Brimonidine]     lethargy  . Sulfamethoxazole-Trimethoprim     Other reaction(s): Other (See Comments) HYPONATREMIA  . Tape Other (See Comments)    Redness and irritation     Past Medical History:  Diagnosis Date  . Arthritis    joints  . Breast cyst   . Cancer ([place]) 2014   right breast cancer and radiation  . Complication of anesthesia    difficulty waking up-quit breathing during first TDC, bruising on jaw after surgery  . Depression   . GERD (gastroesophageal reflux disease)   . Glaucoma   . Headache    migraines, none for awhile  . History of hiatal hernia   . Hypercholesteremia   . Hypertension    controlled on meds  . IBS (irritable bowel syndrome)   . Iron deficiency anemia   . Joint pain   . Motion sickness    fer[place], cars  . Osteoporosis   . Personal history of radiation therapy 2014   Right breast  . Shingles 2012?   right side  . Wears dentures    partial upper (1 tooth)  Past Surgical History:  Procedure Laterality Date  . ABDOMINAL HYSTERECTOMY  1976  . BREAST BIOPSY Right 2014   Invasive Carcinoma  . BREAST LUMPECTOMY Right 2014   + mammo cite rad and aromasin  . BREAST MAMMOSITE Right 08-01-13  . BREAST SURGERY Right 07-02-2013   with sentinal biopsy  . BREAST SURGERY Right 07-18-13  . CARDIAC CATHETERIZATION    . CATARACT EXTRACTION  W/ INTRAOCULAR LENS  IMPLANT, BILATERAL    . CATARACT EXTRACTION W/PHACO Left 06/26/2019   Procedure: [place] DUAL BLADE GONIOTOMY LEFT;  Surgeon: Leandrew Koyanagi, MD;  Location: [place];  Service: Ophthalmology;  Laterality: Left;  . COLONOSCOPY     Dr Candace Cruise  . ESOPHAGOGASTRODUODENOSCOPY (EGD) WITH PROPOFOL N/A 04/04/2018   Procedure: ESOPHAGOGASTRODUODENOSCOPY (EGD) WITH PROPOFOL;  Surgeon: Toledo, Benay Pike, MD;  Location: [facility];  Service: Gastroenterology;  Laterality: N/A;  . EXCISION VAGINAL CYST    . FOOT SURGERY Left 12/05/14  . HAND SURGERY     trigger finger  . MM BREAST STEREO BX*R*R/S Right 2014  . PHOTOCOAGULATION Left 05/16/2018   Procedure: TRANSCLERAL DIODE CYCLOPHOTOCOAGULATION LEFT IVA BLOCK;  Surgeon: Leandrew Koyanagi, MD;  Location: [place];  Service: Ophthalmology;  Laterality: Left;  IVA BLOCK LEFT  . PHOTOCOAGULATION WITH LASER Left 08/22/2016   Procedure: PHOTOCOAGULATION WITH LASER;  Surgeon: Ronnell Freshwater, MD;  Location: [place];  Service: Ophthalmology;  Laterality: Left;  KEEP PT 2ND    Social History   Socioeconomic History  . Marital status: Married    Spouse name: Not on file  . Number of children: 5  . Years of education: 90  . Highest education level: Not on file  Occupational History  . Occupation: Retired  Scientific laboratory technician  . Financial resource strain: Not on file  . Food insecurity    Worry: Not on file    Inability: Not on file  . Transportation needs    Medical: Not on file    Non-medical: Not on file  Tobacco Use  . Smoking status: Never Smoker  . Smokeless tobacco: Never Used  Substance and Sexual Activity  . Alcohol use: No  . Drug use: No  . Sexual activity: Not on file  Lifestyle  . Physical activity    Days per week: Not on file    Minutes per session: Not on file  . Stress: Not on file  Relationships  . Social Herbalist on phone: Not on file    Gets together:  Not on file    Attends religious service: Not on file    Active member of club or organization: Not on file    Attends meetings of clubs or organizations: Not on file    Relationship status: Not on file  . Intimate partner violence    Fear of current or ex partner: Not on file    Emotionally abused: Not on file    Physically abused: Not on file    Forced sexual activity: Not on file  Other Topics Concern  . Not on file  Social History Narrative   Lives with husband   Caffeine use:  none    Family History  Problem Relation Age of Onset  . Cancer Mother        age 40 breast  . Breast cancer Mother 79  . Cancer Brother        prostate/lung/ liver /spleen  . Breast cancer Maternal Aunt  age 88  . Colon cancer Paternal Aunt      Current Outpatient Medications:  Marland Kitchen  Meloxicam 15 MG TBDP, , Disp: , Rfl:  .  amLODipine (NORVASC) 2.5 MG tablet, Take 2.5 mg by mouth daily. am, Disp: , Rfl:  .  Biotin 1000 MCG tablet, Take 1,000 mcg by mouth daily. am, Disp: , Rfl:  .  cetirizine (ZYRTEC) 10 MG tablet, Take 10 mg by mouth every evening. , Disp: , Rfl:  .  FLUoxetine (PROZAC) 20 MG capsule, Take 20 mg by mouth daily. am, Disp: , Rfl:  .  fluticasone (FLONASE) 50 MCG/ACT nasal spray, Place 2 sprays into both nostrils daily. am, Disp: , Rfl:  .  LORazepam (ATIVAN) 0.5 MG tablet, Take 0.5 mg by mouth at bedtime. And as needed, Disp: , Rfl:  .  losartan (COZAAR) 50 MG tablet, Take 50 mg by mouth every evening. , Disp: , Rfl:  .  pantoprazole (PROTONIX) 40 MG tablet, Take 40 mg by mouth 2 (two) times daily. Am and pm, Disp: , Rfl:  .  rizatriptan (MAXALT) 10 MG tablet, Take 10 mg by mouth as needed for migraine. May repeat in 2 hours if needed, Disp: , Rfl:  .  rosuvastatin (CRESTOR) 5 MG tablet, Take 5 mg by mouth every other day. , Disp: , Rfl:  .  Travoprost, BAK Free, (TRAVATAN) 0.004 % SOLN ophthalmic solution, Place 1 drop into both eyes at bedtime., Disp: , Rfl:  .  Vitamin D,  Ergocalciferol, (DRISDOL) 50000 UNITS CAPS capsule, Take 1 capsule by mouth once a week., Disp: , Rfl:   Physical exam: There were no vitals filed for this visit. Physical Exam HENT:     Head: Normocephalic and atraumatic.  Eyes:     Pupils: Pupils are equal, round, and reactive to light.  Neck:     Musculoskeletal: Normal range of motion.  Cardiovascular:     Rate and Rhythm: Normal rate and regular rhythm.     Heart sounds: Normal heart sounds.  Pulmonary:     Effort: Pulmonary effort is normal.     Breath sounds: Normal breath sounds.  Abdominal:     General: Bowel sounds are normal.     Palpations: Abdomen is soft.  Skin:    General: Skin is warm and dry.  Neurological:     Mental Status: She is alert and oriented to person, place, and time.     No focal tenderness to palpation over the back. No herpetic skin lesions.   CMP Latest Ref Rng & Units 09/27/2019  Glucose 70 - 99 mg/dL 95  BUN 8 - 23 mg/dL 15  Creatinine 0.44 - 1.00 mg/dL 0.76  Sodium 135 - 145 mmol/L 134(L)  Potassium 3.5 - 5.1 mmol/L 4.0  Chloride 98 - 111 mmol/L 99  CO2 22 - 32 mmol/L 27  Calcium 8.9 - 10.3 mg/dL 10.0  Total Protein 6.5 - 8.1 g/dL 6.9  Total Bilirubin 0.3 - 1.2 mg/dL 0.5  Alkaline Phos 38 - 126 U/L 88  AST 15 - 41 U/L 24  ALT 0 - 44 U/L 22   CBC Latest Ref Rng & Units 09/27/2019  WBC 4.0 - 10.5 K/uL 5.1  Hemoglobin 12.0 - 15.0 g/dL 12.2  Hematocrit 36.0 - 46.0 % 36.6  Platelets 150 - 400 K/uL 230     Assessment and plan- Patient is a 84 y.o. female with following issues:  1. Iron deficiency anemia: she is not anemic and iron studies are  normal. Repeat levels in 3 and 6 months and I will see her back in 6 months  2. Back pain- likely musculoskeletal. No superficial hereptic lesions. Symptoms have not improved in 6 weeks. Will get MRI thoracic and lumbar spine given her h/o breast cancer  3. H/o breats cancer- she has completed 5 years of hormone therapy. Mammogram in June 2020 was  unremarkable   Visit Diagnosis 1. Iron deficiency anemia, unspecified iron deficiency anemia type   2. Acute midline thoracic back pain      Dr. [name], MD, MPH [facility] at [facility] [id number] 09/29/2019 2:45 PM

## 2019-10-14 ENCOUNTER — Ambulatory Visit: Payer: Medicare Other | Admitting: Oncology

## 2019-10-14 ENCOUNTER — Other Ambulatory Visit: Payer: Medicare Other

## 2019-10-18 ENCOUNTER — Ambulatory Visit
Admission: RE | Admit: 2019-10-18 | Discharge: 2019-10-18 | Disposition: A | Discharge status: Home / Self Care | Payer: Medicare Other | Source: Ambulatory Visit | Attending: Oncology | Admitting: Oncology

## 2019-10-18 ENCOUNTER — Other Ambulatory Visit: Payer: Self-pay

## 2019-10-18 ENCOUNTER — Telehealth: Payer: Self-pay

## 2019-10-18 DIAGNOSIS — D509 Iron deficiency anemia, unspecified: Secondary | ICD-10-CM | POA: Diagnosis present

## 2019-10-18 MED ORDER — GADOBUTROL 1 MMOL/ML IV SOLN
7.0000 mL | Freq: Once | INTRAVENOUS | Status: AC | PRN
  Administered 2019-10-18: 10:00:00 7 mL via INTRAVENOUS

## 2019-10-18 NOTE — Telephone Encounter (Signed)
Patient stated that she has a little bit of pain on her back. However, when I told her that we would refer her to Orthopedic, she declined. She stated that she did not want to see anybody else. She stated that she will see her PCP in about 2 weeks and she will mention about the pain if she still had it. I told her to give [place] a call back in case she would want [place] to refer her to orthopedics. Patient agreed and had no further questions.

## 2019-12-30 ENCOUNTER — Other Ambulatory Visit: Payer: Self-pay

## 2019-12-30 ENCOUNTER — Inpatient Hospital Stay: Payer: Medicare PPO | Attending: Oncology

## 2019-12-30 DIAGNOSIS — D509 Iron deficiency anemia, unspecified: Secondary | ICD-10-CM

## 2019-12-30 LAB — CBC WITH DIFFERENTIAL/PLATELET
Abs Immature Granulocytes: 0.01 10*3/uL (ref 0.00–0.07)
Basophils Absolute: 0 10*3/uL (ref 0.0–0.1)
Basophils Relative: 1 %
Eosinophils Absolute: 0.1 10*3/uL (ref 0.0–0.5)
Eosinophils Relative: 2 %
HCT: 38 % (ref 36.0–46.0)
Hemoglobin: 12.1 g/dL (ref 12.0–15.0)
Immature Granulocytes: 0 %
Lymphocytes Relative: 31 %
Lymphs Abs: 1.6 10*3/uL (ref 0.7–4.0)
MCH: 28.5 pg (ref 26.0–34.0)
MCHC: 31.8 g/dL (ref 30.0–36.0)
MCV: 89.4 fL (ref 80.0–100.0)
Monocytes Absolute: 0.4 10*3/uL (ref 0.1–1.0)
Monocytes Relative: 8 %
Neutro Abs: 3 10*3/uL (ref 1.7–7.7)
Neutrophils Relative %: 58 %
Platelets: 216 10*3/uL (ref 150–400)
RBC: 4.25 MIL/uL (ref 3.87–5.11)
RDW: 13.2 % (ref 11.5–15.5)
WBC: 5.1 10*3/uL (ref 4.0–10.5)
nRBC: 0 % (ref 0.0–0.2)

## 2019-12-30 LAB — IRON AND TIBC
Iron: 62 ug/dL (ref 28–170)
Saturation Ratios: 21 % (ref 10.4–31.8)
TIBC: 302 ug/dL (ref 250–450)
UIBC: 240 ug/dL

## 2019-12-30 LAB — FERRITIN: Ferritin: 326 ng/mL — ABNORMAL HIGH (ref 11–307)

## 2020-01-02 ENCOUNTER — Telehealth: Payer: Self-pay | Admitting: *Deleted

## 2020-01-02 NOTE — Telephone Encounter (Signed)
Patient called requesting that someone call her to explain her results in My Chart [id number]  CBC with Differential/Platelet Order: [id number] Status:  Final result Visible to patient:  Yes (MyChart) Next appt:  03/31/2020 at 01:45 PM in Oncology ([facility]) Dx:  Iron deficiency anemia, unspecified i...  Ref Range & Units 3 d ago 3 mo ago 11 mo ago  WBC 4.0 - 10.5 K/uL 5.1  5.1  5.9   RBC 3.87 - 5.11 MIL/uL 4.25  4.19  4.46   Hemoglobin 12.0 - 15.0 g/dL 12.1  12.2  12.8   HCT 36.0 - 46.0 % 38.0  36.6  38.5   MCV 80.0 - 100.0 fL 89.4  87.4  86.3   MCH 26.0 - 34.0 pg 28.5  29.1  28.7   MCHC 30.0 - 36.0 g/dL 31.8  33.3  33.2   RDW 11.5 - 15.5 % 13.2  12.9  14.6   Platelets 150 - 400 K/uL 216  230  216   nRBC 0.0 - 0.2 % 0.0  0.0  0.0   Neutrophils Relative % % 58  56  58   Neutro Abs 1.7 - 7.7 K/uL 3.0  2.9  3.5   Lymphocytes Relative % 31  32  32   Lymphs Abs 0.7 - 4.0 K/uL 1.6  1.7  1.9   Monocytes Relative % 8  9  8    Monocytes Absolute 0.1 - 1.0 K/uL 0.4  0.5  0.5   Eosinophils Relative % 2  2  1    Eosinophils Absolute 0.0 - 0.5 K/uL 0.1  0.1  0.1   Basophils Relative % 1  1  1    Basophils Absolute 0.0 - 0.1 K/uL 0.0  0.0  0.0   Immature Granulocytes % 0  0  0   Abs Immature Granulocytes 0.00 - 0.07 K/uL 0.01  0.01 CM  0.02 CM   Comment: Performed at [facility], [address] [place], [place] [address]  Resulting Agency  [facility] CLIN LAB [facility] CL[facility] LAB [facility] LAB      Specimen Collected: 12/30/19 14:01 Last Resulted: 12/30/19 14:20     Lab Flowsheet   Order Details   View Encounter   Lab and Collection Details   Routing   Result History     CM=Additional comments      Other Results from 12/30/2019  Iron and TIBC  Status:  Final result Visible to patient:  Yes (MyChart) Next appt:  03/31/2020 at 01:45 PM in Oncology ([facility]) Dx:  Iron deficiency anemia, unspecified i... Order: [id number]  Ref Range & Units 3 d ago 3 mo ago 11 mo ago  Iron 28 -  170 ug/dL 62  78  76   TIBC 250 - 450 ug/dL 302  301  306   Saturation Ratios 10.4 - 31.8 % 21  26  25    UIBC ug/dL 240  223 CM  230 CM   Comment: Performed at [facility], [address] [place], [place] [address]  Resulting Agency  [facility] CLIN LAB [facility] CLIN LAB [facility] CL[facility] LAB      Specimen Collected: 12/30/19 14:01 Last Resulted: 12/30/19 15:11     Lab Flowsheet   Order Details   View Encounter   Lab and Collection Details   Routing   Result History     CM=Additional comments        Contains abnormal data Ferritin  Status:  Final result Visible to patient:  Yes (MyChart)  Next appt:  03/31/2020 at 01:45 PM in Oncology ([facility]) Dx:  Iron deficiency anemia, unspecified i... Order: [id number]  Ref Range & Units 3 d ago 3 mo ago 11 mo ago  Ferritin 11 - 307 ng/mL 326High   356High  CM  414High  CM   Comment: Performed at [facility], [address] [place], [place] [address]  Resulting Agency  [facility] CLIN LAB [facility] CLIN LAB [facility] CL[facility] LAB      Specimen Collected: 12/30/19 14:01 Last Resulted: 12/30/19 15:11

## 2020-01-03 NOTE — Telephone Encounter (Signed)
Call returned to patient and she was given Dr Elroy Channel response

## 2020-01-03 NOTE — Telephone Encounter (Signed)
Please let her know she is not anemic and her iron studies are normal. Her ferritin has always been a little high which is not worrisome. As of now nothing more to do from her iron standpoint

## 2020-01-16 ENCOUNTER — Other Ambulatory Visit: Payer: Self-pay | Admitting: General Practice

## 2020-01-16 DIAGNOSIS — G501 Atypical facial pain: Secondary | ICD-10-CM

## 2020-02-11 ENCOUNTER — Other Ambulatory Visit: Payer: Medicare PPO

## 2020-02-24 ENCOUNTER — Other Ambulatory Visit: Payer: Self-pay | Admitting: Family Medicine

## 2020-02-24 DIAGNOSIS — R6884 Jaw pain: Secondary | ICD-10-CM

## 2020-03-02 ENCOUNTER — Other Ambulatory Visit: Payer: Self-pay | Admitting: Student

## 2020-03-02 DIAGNOSIS — R011 Cardiac murmur, unspecified: Secondary | ICD-10-CM

## 2020-03-05 ENCOUNTER — Other Ambulatory Visit: Payer: Self-pay | Admitting: Student

## 2020-03-05 ENCOUNTER — Other Ambulatory Visit: Payer: Self-pay

## 2020-03-05 DIAGNOSIS — R0789 Other chest pain: Secondary | ICD-10-CM

## 2020-03-08 ENCOUNTER — Ambulatory Visit
Admission: RE | Admit: 2020-03-08 | Discharge: 2020-03-08 | Disposition: A | Discharge status: Home / Self Care | Payer: Medicare PPO | Source: Ambulatory Visit | Attending: Family Medicine | Admitting: Family Medicine

## 2020-03-08 ENCOUNTER — Other Ambulatory Visit: Payer: Self-pay

## 2020-03-08 DIAGNOSIS — R6884 Jaw pain: Secondary | ICD-10-CM

## 2020-03-12 ENCOUNTER — Ambulatory Visit: Payer: Medicare PPO

## 2020-03-21 ENCOUNTER — Other Ambulatory Visit: Payer: Medicare PPO

## 2020-03-24 ENCOUNTER — Ambulatory Visit: Payer: Medicare PPO

## 2020-03-30 ENCOUNTER — Inpatient Hospital Stay: Payer: Medicare PPO | Attending: Oncology

## 2020-03-30 ENCOUNTER — Other Ambulatory Visit: Payer: Self-pay

## 2020-03-30 DIAGNOSIS — Z90710 Acquired absence of both cervix and uterus: Secondary | ICD-10-CM | POA: Diagnosis not present

## 2020-03-30 DIAGNOSIS — M810 Age-related osteoporosis without current pathological fracture: Secondary | ICD-10-CM | POA: Insufficient documentation

## 2020-03-30 DIAGNOSIS — M542 Cervicalgia: Secondary | ICD-10-CM | POA: Insufficient documentation

## 2020-03-30 DIAGNOSIS — D509 Iron deficiency anemia, unspecified: Secondary | ICD-10-CM | POA: Diagnosis not present

## 2020-03-30 DIAGNOSIS — Z803 Family history of malignant neoplasm of breast: Secondary | ICD-10-CM | POA: Insufficient documentation

## 2020-03-30 DIAGNOSIS — Z923 Personal history of irradiation: Secondary | ICD-10-CM | POA: Diagnosis not present

## 2020-03-30 DIAGNOSIS — Z800 Family history of malignant neoplasm of digestive organs: Secondary | ICD-10-CM | POA: Diagnosis not present

## 2020-03-30 DIAGNOSIS — Z801 Family history of malignant neoplasm of trachea, bronchus and lung: Secondary | ICD-10-CM | POA: Diagnosis not present

## 2020-03-30 DIAGNOSIS — Z853 Personal history of malignant neoplasm of breast: Secondary | ICD-10-CM | POA: Diagnosis not present

## 2020-03-30 DIAGNOSIS — R519 Headache, unspecified: Secondary | ICD-10-CM | POA: Diagnosis not present

## 2020-03-30 DIAGNOSIS — Z808 Family history of malignant neoplasm of other organs or systems: Secondary | ICD-10-CM | POA: Diagnosis not present

## 2020-03-30 DIAGNOSIS — M79602 Pain in left arm: Secondary | ICD-10-CM | POA: Insufficient documentation

## 2020-03-30 LAB — IRON AND TIBC
Iron: 68 ug/dL (ref 28–170)
Saturation Ratios: 21 % (ref 10.4–31.8)
TIBC: 322 ug/dL (ref 250–450)
UIBC: 254 ug/dL

## 2020-03-30 LAB — CBC WITH DIFFERENTIAL/PLATELET
Abs Immature Granulocytes: 0.02 10*3/uL (ref 0.00–0.07)
Basophils Absolute: 0 10*3/uL (ref 0.0–0.1)
Basophils Relative: 1 %
Eosinophils Absolute: 0.1 10*3/uL (ref 0.0–0.5)
Eosinophils Relative: 2 %
HCT: 40.9 % (ref 36.0–46.0)
Hemoglobin: 13.4 g/dL (ref 12.0–15.0)
Immature Granulocytes: 0 %
Lymphocytes Relative: 31 %
Lymphs Abs: 1.6 10*3/uL (ref 0.7–4.0)
MCH: 28.2 pg (ref 26.0–34.0)
MCHC: 32.8 g/dL (ref 30.0–36.0)
MCV: 86.1 fL (ref 80.0–100.0)
Monocytes Absolute: 0.4 10*3/uL (ref 0.1–1.0)
Monocytes Relative: 7 %
Neutro Abs: 3.1 10*3/uL (ref 1.7–7.7)
Neutrophils Relative %: 59 %
Platelets: 237 10*3/uL (ref 150–400)
RBC: 4.75 MIL/uL (ref 3.87–5.11)
RDW: 13.5 % (ref 11.5–15.5)
WBC: 5.2 10*3/uL (ref 4.0–10.5)
nRBC: 0 % (ref 0.0–0.2)

## 2020-03-30 LAB — FERRITIN: Ferritin: 238 ng/mL (ref 11–307)

## 2020-03-31 ENCOUNTER — Inpatient Hospital Stay (HOSPITAL_BASED_OUTPATIENT_CLINIC_OR_DEPARTMENT_OTHER): Payer: Medicare PPO | Admitting: Oncology

## 2020-03-31 ENCOUNTER — Inpatient Hospital Stay: Payer: Medicare PPO

## 2020-03-31 ENCOUNTER — Telehealth: Payer: Self-pay

## 2020-03-31 ENCOUNTER — Encounter: Payer: Self-pay | Admitting: Oncology

## 2020-03-31 DIAGNOSIS — D509 Iron deficiency anemia, unspecified: Secondary | ICD-10-CM | POA: Diagnosis not present

## 2020-03-31 DIAGNOSIS — Z08 Encounter for follow-up examination after completed treatment for malignant neoplasm: Secondary | ICD-10-CM

## 2020-03-31 DIAGNOSIS — R519 Headache, unspecified: Secondary | ICD-10-CM

## 2020-03-31 DIAGNOSIS — Z853 Personal history of malignant neoplasm of breast: Secondary | ICD-10-CM | POA: Diagnosis not present

## 2020-03-31 NOTE — Progress Notes (Signed)
Patient is doing virtual visit, she mentions some discomfort on her left side shoulder, jaw, ear and neck nothing new but thought she had TMJ.

## 2020-03-31 NOTE — Telephone Encounter (Signed)
Called [facility]'s number and asked if they had received a referral from our office and they stated that they had. They stated that they will contact patient with an appointment date and time. If they are not able to contact patient,  They would also let us know.

## 2020-04-03 ENCOUNTER — Encounter: Payer: Self-pay | Admitting: Neurology

## 2020-04-03 NOTE — Progress Notes (Signed)
I connected with Abigail Shaw on 04/03/20 at  2:15 PM EDT by video enabled telemedicine visit and verified that I am speaking with the correct person using two identifiers.   I discussed the limitations, risks, security and privacy concerns of performing an evaluation and management service by telemedicine and the availability of in-person appointments. I also discussed with the patient that there may be a patient responsible charge related to this service. The patient expressed understanding and agreed to proceed.  Other persons participating in the visit and their role in the encounter:  none  Patient's location:  home Provider's location:  Work  Diagnosis- 1. Iron deficiency anemia 2. 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 She did not require adjuvant chemotherapy as she had a low Oncotype score. She also has a history of osteoporosis for which she is on calcium and vitamin D. She was previously on Fosamax which was stopped and patient did not want to take any parenteral bisphosphonates. Patient has a history of iron deficiency anemia.   Interval history: Patient reports as having problems with pain in the left side of her face which travels to her neck ear and jaw.  She was seen by ENT and also underwent MRI of the TM joint which was unremarkable.  However patient continues to be bothered by the symptoms and is unable to get restful during the day and night.  She has also been evaluated by her PCP and there is no evidence of shingles noted   Review of Systems  Constitutional: Negative for chills, fever, malaise/fatigue and weight loss.  HENT: Negative for congestion, ear  discharge and nosebleeds.   Eyes: Negative for blurred vision.  Respiratory: Negative for cough, hemoptysis, sputum production, shortness of breath and wheezing.   Cardiovascular: Negative for chest pain, palpitations, orthopnea and claudication.  Gastrointestinal: Negative for abdominal pain, blood in stool, constipation, diarrhea, heartburn, melena, nausea and vomiting.  Genitourinary: Negative for dysuria, flank pain, frequency, hematuria and urgency.  Musculoskeletal: Negative for back pain, joint pain and myalgias.  Skin: Negative for rash.  Neurological: Negative for dizziness, tingling, focal weakness, seizures, weakness and headaches.       Pain in the left side of the face  Endo/Heme/Allergies: Does not bruise/bleed easily.  Psychiatric/Behavioral: Negative for depression and suicidal ideas. The patient does not have insomnia.     Allergies  Allergen Reactions  . Lisinopril Cough       . Simbrinza [Brinzolamide-Brimonidine]     lethargy  . Sulfamethoxazole-Trimethoprim     Other reaction(s): Other (See Comments) HYPONATREMIA  . Tape Other (See Comments)    Redness and irritation    Past Medical History:  Diagnosis Date  . Arthritis    joints  . Breast cyst   . Cancer ([place]) 2014   right breast cancer and radiation  . Complication of anesthesia    difficulty waking up-quit breathing during first TDC, bruising on jaw after surgery  . Depression   . GERD (gastroesophageal reflux disease)   . Glaucoma   . Headache    migraines, none for awhile  . History of hiatal hernia   . Hypercholesteremia   . Hypertension    controlled on meds  .  IBS (irritable bowel syndrome)   . Iron deficiency anemia   . Joint pain   . Motion sickness    ferry, cars  . Osteoporosis   . Personal history of radiation therapy 2014   Right breast  . Shingles 2012?   right side  . Wears dentures    partial upper (1 tooth)    Past Surgical History:  Procedure Laterality Date  .  ABDOMINAL HYSTERECTOMY  1976  . BREAST BIOPSY Right 2014   Invasive Carcinoma  . BREAST LUMPECTOMY Right 2014   + mammo cite rad and aromasin  . BREAST MAMMOSITE Right 08-01-13  . BREAST SURGERY Right 07-02-2013   with sentinal biopsy  . BREAST SURGERY Right 07-18-13  . CARDIAC CATHETERIZATION    . CATARACT EXTRACTION W/ INTRAOCULAR LENS  IMPLANT, BILATERAL    . CATARACT EXTRACTION W/PHACO Left 06/26/2019   Procedure: Neldon Newport DUAL BLADE GONIOTOMY LEFT;  Surgeon: Leandrew Koyanagi, MD;  Location: [place];  Service: Ophthalmology;  Laterality: Left;  . COLONOSCOPY     Dr Candace Cruise  . ESOPHAGOGASTRODUODENOSCOPY (EGD) WITH PROPOFOL N/A 04/04/2018   Procedure: ESOPHAGOGASTRODUODENOSCOPY (EGD) WITH PROPOFOL;  Surgeon: Toledo, Benay Pike, MD;  Location: [facility];  Service: Gastroenterology;  Laterality: N/A;  . EXCISION VAGINAL CYST    . FOOT SURGERY Left 12/05/14  . HAND SURGERY     trigger finger  . MM BREAST STEREO BX*R*R/S Right 2014  . PHOTOCOAGULATION Left 05/16/2018   Procedure: TRANSCLERAL DIODE CYCLOPHOTOCOAGULATION LEFT IVA BLOCK;  Surgeon: Leandrew Koyanagi, MD;  Location: [place];  Service: Ophthalmology;  Laterality: Left;  IVA BLOCK LEFT  . PHOTOCOAGULATION WITH LASER Left 08/22/2016   Procedure: PHOTOCOAGULATION WITH LASER;  Surgeon: Ronnell Freshwater, MD;  Location: [place];  Service: Ophthalmology;  Laterality: Left;  KEEP PT 2ND    Social History   Socioeconomic History  . Marital status: Married    Spouse name: Not on file  . Number of children: 5  . Years of education: 50  . Highest education level: Not on file  Occupational History  . Occupation: Retired  Tobacco Use  . Smoking status: Never Smoker  . Smokeless tobacco: Never Used  Substance and Sexual Activity  . Alcohol use: No  . Drug use: No  . Sexual activity: Not on file  Other Topics Concern  . Not on file  Social History Narrative   Lives with husband    Caffeine use:  none   Social Determinants of Health   Financial Resource Strain:   . Difficulty of Paying Living Expenses:   Food Insecurity:   . Worried About Charity fundraiser in the Last Year:   . Arboriculturist in the Last Year:   Transportation Needs:   . Film/video editor (Medical):   Marland Kitchen Lack of Transportation (Non-Medical):   Physical Activity:   . Days of Exercise per Week:   . Minutes of Exercise per Session:   Stress:   . Feeling of Stress :   Social Connections:   . Frequency of Communication with Friends and Family:   . Frequency of Social Gatherings with Friends and Family:   . Attends Religious Services:   . Active Member of Clubs or Organizations:   . Attends Archivist Meetings:   Marland Kitchen Marital Status:   Intimate Partner Violence:   . Fear of Current or Ex-Partner:   . Emotionally Abused:   Marland Kitchen Physically Abused:   . Sexually Abused:  Family History  Problem Relation Age of Onset  . Cancer Mother        age 73 breast  . Breast cancer Mother 66  . Cancer Brother        prostate/lung/ liver /spleen  . Breast cancer Maternal Aunt        age 7  . Colon cancer Paternal Aunt      Current Outpatient Medications:  .  amLODipine (NORVASC) 2.5 MG tablet, Take 2.5 mg by mouth daily. am, Disp: , Rfl:  .  Biotin 1000 MCG tablet, Take 1,000 mcg by mouth daily. am, Disp: , Rfl:  .  cetirizine (ZYRTEC) 10 MG tablet, Take 10 mg by mouth every evening. , Disp: , Rfl:  .  dorzolamide-timolol (COSOPT) 22.3-6.8 MG/ML ophthalmic solution, Place 1 drop into the left eye 2 (two) times daily., Disp: , Rfl:  .  FLUoxetine (PROZAC) 20 MG capsule, Take 20 mg by mouth daily. am, Disp: , Rfl:  .  fluticasone (FLONASE) 50 MCG/ACT nasal spray, Place 2 sprays into both nostrils daily. am, Disp: , Rfl:  .  LORazepam (ATIVAN) 0.5 MG tablet, Take 0.5 mg by mouth at bedtime. And as needed, Disp: , Rfl:  .  losartan (COZAAR) 50 MG tablet, Take 50 mg by mouth every  evening. , Disp: , Rfl:  .  pantoprazole (PROTONIX) 40 MG tablet, Take 40 mg by mouth daily. Am and pm , Disp: , Rfl:  .  rosuvastatin (CRESTOR) 5 MG tablet, Take 5 mg by mouth every other day. , Disp: , Rfl:  .  Travoprost, BAK Free, (TRAVATAN) 0.004 % SOLN ophthalmic solution, Place 1 drop into both eyes at bedtime., Disp: , Rfl:  .  Vitamin D, Ergocalciferol, (DRISDOL) 50000 UNITS CAPS capsule, Take 1 capsule by mouth once a week., Disp: , Rfl:  .  Meloxicam 15 MG TBDP, , Disp: , Rfl:  .  prednisoLONE acetate (PRED FORTE) 1 % ophthalmic suspension, Place 1 drop into the left eye 2 (two) times daily., Disp: , Rfl:  .  rizatriptan (MAXALT) 10 MG tablet, Take 10 mg by mouth as needed for migraine. May repeat in 2 hours if needed, Disp: , Rfl:   MR TMJ  Result Date: 03/09/2020 CLINICAL DATA:  Left-sided TMJ pain for the past 3 months. History of breast cancer. EXAM: MRI OF TEMPOROMANDIBULAR JOINT WITHOUT CONTRAST TECHNIQUE: Multiplanar, multisequence MR imaging of the temporomandibular joint was performed following the standard protocol. No intravenous contrast was administered. COMPARISON:  None. FINDINGS: Right temporomandibular joint: The articular disc is normally positioned between the mandibular condyle and the temporal bone in both open and closed positions.There is normal anterior translation of the mandibular condyle with jaw opening.There is no joint effusion. Left temporomandibular joint: The articular disc is normally positioned between the mandibular condyle and the temporal bone in both open and closed positions.There is normal anterior translation of the mandibular condyle with jaw opening.There is no joint effusion. Other: None. IMPRESSION: Normal examination of the temporomandibular joints. Electronically Signed   By: Titus Dubin M.D.   On: 03/09/2020 08:08    No images are attached to the encounter.   CMP Latest Ref Rng & Units 09/27/2019  Glucose 70 - 99 mg/dL 95  BUN 8 - 23  mg/dL 15  Creatinine 0.44 - 1.00 mg/dL 0.76  Sodium 135 - 145 mmol/L 134(L)  Potassium 3.5 - 5.1 mmol/L 4.0  Chloride 98 - 111 mmol/L 99  CO2 22 - 32 mmol/L 27  Calcium  8.9 - 10.3 mg/dL 10.0  Total Protein 6.5 - 8.1 g/dL 6.9  Total Bilirubin 0.3 - 1.2 mg/dL 0.5  Alkaline Phos 38 - 126 U/L 88  AST 15 - 41 U/L 24  ALT 0 - 44 U/L 22   CBC Latest Ref Rng & Units 03/30/2020  WBC 4.0 - 10.5 K/uL 5.2  Hemoglobin 12.0 - 15.0 g/dL 13.4  Hematocrit 36.0 - 46.0 % 40.9  Platelets 150 - 400 K/uL 237     Observation/objective: Appears in no acute distress of a video visit today.  Breathing is nonlabored  Assessment and plan: Patient is a 85 year old female with the following issues:  1.  History of breast cancer: Patient is s/p 5 years of hormone therapy which she completed in August 2019. Mammogram in June 2020 was normal and we will schedule a mammogram in June 2021.  From a breast cancer standpoint patient appears to be doing well.  Patient wishes to come for an in person breast exam which I will arrange for in the next 1 to 2 weeks  2.  Left-sided facial pain: Refer to neurology to rule out conditions such as trigeminal neuralgia or giant cell arteritis.  She has already been evaluated by ENT and cardiology  3.  Iron deficiency anemia: Patient is currently not anemic and iron studies are normal.  She did not require any IV iron at this time  Follow-up instructions: I will see her in 1 to 2 weeks for a breast exam  I discussed the assessment and treatment plan with the patient. The patient was provided an opportunity to ask questions and all were answered. The patient agreed with the plan and demonstrated an understanding of the instructions.   The patient was advised to call back or seek an in-person evaluation if the symptoms worsen or if the condition fails to improve as anticipated.   Visit Diagnosis: 1. Encounter for follow-up surveillance of breast cancer   2. Left facial pain   3.  Iron deficiency anemia, unspecified iron deficiency anemia type     Dr. [name], MD, MPH [facility] at [facility] Tel- [phone] 04/03/2020 8:05 AM

## 2020-04-14 ENCOUNTER — Other Ambulatory Visit: Payer: Self-pay

## 2020-04-14 ENCOUNTER — Encounter: Payer: Self-pay | Admitting: Oncology

## 2020-04-14 ENCOUNTER — Inpatient Hospital Stay: Payer: Medicare PPO | Admitting: Oncology

## 2020-04-14 VITALS — BP 129/85 | HR 50 | Temp 96.1°F | Resp 18 | Wt 155.9 lb

## 2020-04-14 DIAGNOSIS — Z08 Encounter for follow-up examination after completed treatment for malignant neoplasm: Secondary | ICD-10-CM

## 2020-04-14 DIAGNOSIS — Z853 Personal history of malignant neoplasm of breast: Secondary | ICD-10-CM

## 2020-04-14 DIAGNOSIS — R519 Headache, unspecified: Secondary | ICD-10-CM | POA: Diagnosis not present

## 2020-04-14 NOTE — Progress Notes (Signed)
Patient stated that she had been doing well except for the pain she had been having since Decemeber on her left side of her upper body.

## 2020-04-17 NOTE — Progress Notes (Signed)
Hematology/Oncology Consult note [facility]  Telephone:([phone] [phone]  Patient Care Team: [name], MD as PCP - General (Family Medicine) Christene Lye, MD (General Surgery) [name], MD as Referring Physician (Family Medicine)   Name of the patient: Abigail Shaw  [id number]  1935-01-29   Date of visit: 04/17/20  Diagnosis- 1. Iron deficiency anemia 2. 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 She did not require adjuvant chemotherapy as she had a low Oncotype score. She also has a history of osteoporosis for which she is on calcium and vitamin D. She was previously on Fosamax which was stopped and patient did not want to take any parenteral bisphosphonates. Patient has a history of iron deficiency anemia.    Interval history-patient continues to struggle with left-sided jaw pain which radiates to her face and also associated with upper neck pain and sometimes left arm pain.  She also gets on and off headaches.  She has been evaluated by ENT cardiology and no discernible etiology was found.  She has an appointment coming up with neurology in about 1 month's time  ECOG PS- 1 Pain scale- 4   Review of systems- Review of Systems  Constitutional: Positive for malaise/fatigue. Negative for chills, fever and weight loss.  HENT: Negative for congestion, ear discharge and nosebleeds.        Left-sided facial pain  Eyes: Negative for blurred vision.  Respiratory: Negative for cough, hemoptysis, sputum production, shortness of breath and wheezing.   Cardiovascular: Negative for chest pain, palpitations, orthopnea and claudication.    Gastrointestinal: Negative for abdominal pain, blood in stool, constipation, diarrhea, heartburn, melena, nausea and vomiting.  Genitourinary: Negative for dysuria, flank pain, frequency, hematuria and urgency.  Musculoskeletal: Negative for back pain, joint pain and myalgias.  Skin: Negative for rash.  Neurological: Negative for dizziness, tingling, focal weakness, seizures, weakness and headaches.  Endo/Heme/Allergies: Does not bruise/bleed easily.  Psychiatric/Behavioral: Negative for depression and suicidal ideas. The patient does not have insomnia.       Allergies  Allergen Reactions  . Lisinopril Cough       . Simbrinza [Brinzolamide-Brimonidine]     lethargy  . Sulfamethoxazole-Trimethoprim     Other reaction(s): Other (See Comments) HYPONATREMIA  . Tape Other (See Comments)    Redness and irritation     Past Medical History:  Diagnosis Date  . Arthritis    joints  . Breast cyst   . Cancer ([place]) 2014   right breast cancer and radiation  . Complication of anesthesia    difficulty waking up-quit breathing during first TDC, bruising on jaw after surgery  . Depression   . GERD (gastroesophageal reflux disease)   . Glaucoma   . Headache    migraines, none for awhile  . History of hiatal hernia   . Hypercholesteremia   . Hypertension    controlled on meds  . IBS (irritable bowel syndrome)   . Iron deficiency anemia   . Joint pain   . Motion sickness    ferry, cars  . Osteoporosis   . Personal history of radiation therapy 2014   Right breast  . Shingles 2012?   right side  . Wears dentures  partial upper (1 tooth)     Past Surgical History:  Procedure Laterality Date  . ABDOMINAL HYSTERECTOMY  1976  . BREAST BIOPSY Right 2014   Invasive Carcinoma  . BREAST LUMPECTOMY Right 2014   + mammo cite rad and aromasin  . BREAST MAMMOSITE Right 08-01-13  . BREAST SURGERY Right 07-02-2013   with sentinal biopsy  . BREAST SURGERY Right 07-18-13  . CARDIAC  CATHETERIZATION    . CATARACT EXTRACTION W/ INTRAOCULAR LENS  IMPLANT, BILATERAL    . CATARACT EXTRACTION W/PHACO Left 06/26/2019   Procedure: Neldon Newport DUAL BLADE GONIOTOMY LEFT;  Surgeon: Leandrew Koyanagi, MD;  Location: [place];  Service: Ophthalmology;  Laterality: Left;  . COLONOSCOPY     Dr Candace Cruise  . ESOPHAGOGASTRODUODENOSCOPY (EGD) WITH PROPOFOL N/A 04/04/2018   Procedure: ESOPHAGOGASTRODUODENOSCOPY (EGD) WITH PROPOFOL;  Surgeon: Toledo, Benay Pike, MD;  Location: [facility];  Service: Gastroenterology;  Laterality: N/A;  . EXCISION VAGINAL CYST    . FOOT SURGERY Left 12/05/14  . HAND SURGERY     trigger finger  . MM BREAST STEREO BX*R*R/S Right 2014  . PHOTOCOAGULATION Left 05/16/2018   Procedure: TRANSCLERAL DIODE CYCLOPHOTOCOAGULATION LEFT IVA BLOCK;  Surgeon: Leandrew Koyanagi, MD;  Location: [place];  Service: Ophthalmology;  Laterality: Left;  IVA BLOCK LEFT  . PHOTOCOAGULATION WITH LASER Left 08/22/2016   Procedure: PHOTOCOAGULATION WITH LASER;  Surgeon: Ronnell Freshwater, MD;  Location: [place];  Service: Ophthalmology;  Laterality: Left;  KEEP PT 2ND    Social History   Socioeconomic History  . Marital status: Married    Spouse name: Not on file  . Number of children: 5  . Years of education: 33  . Highest education level: Not on file  Occupational History  . Occupation: Retired  Tobacco Use  . Smoking status: Never Smoker  . Smokeless tobacco: Never Used  Substance and Sexual Activity  . Alcohol use: No  . Drug use: No  . Sexual activity: Not on file  Other Topics Concern  . Not on file  Social History Narrative   Lives with husband   Caffeine use:  none   Social Determinants of Health   Financial Resource Strain:   . Difficulty of Paying Living Expenses:   Food Insecurity:   . Worried About Charity fundraiser in the Last Year:   . Arboriculturist in the Last Year:   Transportation Needs:   . Lexicographer (Medical):   Marland Kitchen Lack of Transportation (Non-Medical):   Physical Activity:   . Days of Exercise per Week:   . Minutes of Exercise per Session:   Stress:   . Feeling of Stress :   Social Connections:   . Frequency of Communication with Friends and Family:   . Frequency of Social Gatherings with Friends and Family:   . Attends Religious Services:   . Active Member of Clubs or Organizations:   . Attends Archivist Meetings:   Marland Kitchen Marital Status:   Intimate Partner Violence:   . Fear of Current or Ex-Partner:   . Emotionally Abused:   Marland Kitchen Physically Abused:   . Sexually Abused:     Family History  Problem Relation Age of Onset  . Cancer Mother        age 50 breast  . Breast cancer Mother 45  . Cancer Brother        prostate/lung/ liver /spleen  . Breast cancer Maternal Aunt  age 20  . Colon cancer Paternal Aunt      Current Outpatient Medications:  .  amLODipine (NORVASC) 2.5 MG tablet, Take 2.5 mg by mouth daily. am, Disp: , Rfl:  .  Biotin 1000 MCG tablet, Take 1,000 mcg by mouth daily. am, Disp: , Rfl:  .  cetirizine (ZYRTEC) 10 MG tablet, Take 10 mg by mouth every evening. , Disp: , Rfl:  .  dorzolamide-timolol (COSOPT) 22.3-6.8 MG/ML ophthalmic solution, Place 1 drop into the left eye 2 (two) times daily., Disp: , Rfl:  .  FLUoxetine (PROZAC) 20 MG capsule, Take 20 mg by mouth daily. am, Disp: , Rfl:  .  fluticasone (FLONASE) 50 MCG/ACT nasal spray, Place 2 sprays into both nostrils daily. am, Disp: , Rfl:  .  LORazepam (ATIVAN) 0.5 MG tablet, Take 0.5 mg by mouth at bedtime. And as needed, Disp: , Rfl:  .  losartan (COZAAR) 50 MG tablet, Take 50 mg by mouth every evening. , Disp: , Rfl:  .  pantoprazole (PROTONIX) 40 MG tablet, Take 40 mg by mouth daily. Am and pm , Disp: , Rfl:  .  prednisoLONE acetate (PRED FORTE) 1 % ophthalmic suspension, Place 1 drop into the left eye 2 (two) times daily., Disp: , Rfl:  .  rizatriptan (MAXALT) 10 MG  tablet, Take 10 mg by mouth as needed for migraine. May repeat in 2 hours if needed, Disp: , Rfl:  .  rosuvastatin (CRESTOR) 5 MG tablet, Take 5 mg by mouth every other day. , Disp: , Rfl:  .  Travoprost, BAK Free, (TRAVATAN) 0.004 % SOLN ophthalmic solution, Place 1 drop into both eyes at bedtime., Disp: , Rfl:  .  Vitamin D, Ergocalciferol, (DRISDOL) 50000 UNITS CAPS capsule, Take 1 capsule by mouth once a week., Disp: , Rfl:  .  Meloxicam 15 MG TBDP, , Disp: , Rfl:   Physical exam:  Vitals:   04/14/20 1013  BP: 129/85  Pulse: (!) 50  Resp: 18  Temp: (!) 96.1 F (35.6 C)  TempSrc: Tympanic  SpO2: 100%  Weight: 155 lb 14.4 oz (70.7 kg)   Physical Exam Constitutional:      General: She is not in acute distress. Cardiovascular:     Rate and Rhythm: Normal rate and regular rhythm.     Heart sounds: Normal heart sounds.  Pulmonary:     Effort: Pulmonary effort is normal.     Breath sounds: Normal breath sounds.  Abdominal:     General: Bowel sounds are normal.     Palpations: Abdomen is soft.  Skin:    General: Skin is warm and dry.  Neurological:     Mental Status: She is alert and oriented to person, place, and time.    Breast exam was performed in seated and lying down position. Patient is status post right lumpectomy with a well-healed surgical scar. No evidence of any palpable masses. No evidence of axillary adenopathy. No evidence of any palpable masses or lumps in the left breast. No evidence of leftt axillary adenopathy   CMP Latest Ref Rng & Units 09/27/2019  Glucose 70 - 99 mg/dL 95  BUN 8 - 23 mg/dL 15  Creatinine 0.44 - 1.00 mg/dL 0.76  Sodium 135 - 145 mmol/L 134(L)  Potassium 3.5 - 5.1 mmol/L 4.0  Chloride 98 - 111 mmol/L 99  CO2 22 - 32 mmol/L 27  Calcium 8.9 - 10.3 mg/dL 10.0  Total Protein 6.5 - 8.1 g/dL 6.9  Total Bilirubin 0.3 -  1.2 mg/dL 0.5  Alkaline Phos 38 - 126 U/L 88  AST 15 - 41 U/L 24  ALT 0 - 44 U/L 22   CBC Latest Ref Rng & Units 03/30/2020   WBC 4.0 - 10.5 K/uL 5.2  Hemoglobin 12.0 - 15.0 g/dL 13.4  Hematocrit 36.0 - 46.0 % 40.9  Platelets 150 - 400 K/uL 237    Assessment and plan- Patient is a 85 y.o. female with history of right breast cancer s/p surgery radiation treatment and 5 years of hormone therapy in August 2019.  She is here for routine breast exam  Clinically patient is doing well and no concerning signs and symptoms on today's exam.  She will be due for a repeat mammogram in June 2021 which I will order  Left-sided facial pain: She is going to be seen neurology soon.  Patient continues to have the symptoms and neurology does not find any etiology from their side MRI cervical spine could be considered given her associated symptoms of neck pain and left arm pain although patient states that predominantly her symptoms are over the left side of her face which would not be explained by her cervical pathology  I will see her back in 4 months with CBC ferritin and iron studies   Visit Diagnosis 1. Encounter for follow-up surveillance of breast cancer   2. Facial pain      Dr. [name], MD, MPH [facility] at [facility] [id number] 04/17/2020 7:50 AM

## 2020-05-11 NOTE — Progress Notes (Signed)
NEUROLOGY CONSULTATION NOTE  Abigail Shaw MRN: [id number] DOB: 1935/02/18  Referring provider: [name], MD Primary care provider: [name], MD  Reason for consult:  Facial pain  HISTORY OF PRESENT ILLNESS: Abigail Shaw is an 85 year old right-handed white female with HTN, HLD, IBS, and history of right breast cancer and radiation who presents for left-sided facial pain.  History supplemented by referring provider's note.  In December, she developed discomfort under her jaw on the left.  It felt like a hard swelling, possibly a swollen lymph node.  She then developed an aching type pain along the jaw line and into the left ear, throat and temple, which subsequently radiated to back of head and down the left side of her neck.  She subsequently developed pain in the left shoulder and entire arm but not sure if related as she has chronic issues with her shoulders.  It used to be persistent pain but now it is intermittent but occurs daily.  She has baseline poor vision in her left eye due to glaucoma and prior cataract surgery but no new visual disturbance.  She may have occasional sharp pain across her forehead.  Sometimes notes left sided numbness of her forehead.  No facial weakness.  No numbness or weakness of her arm.  No radicular pain down the neck and arm.  No paroxysmal shooting pain as would be seen with trigeminal neuralgia.  Pain may wake her up at night.  Laying down to sleep helps.  No particular triggers.  Thyroid testing reportedly unremarkable.  She was evaluated by her dentist.  MRI of bilateral TMJ on 03/08/2020 personally reviewed was normal.  She was evaluated by cardiology.  Work up including EKG, echo and stress test, was unremarkable.  She was evaluated by ENT with no etiology found.    PAST MEDICAL HISTORY: Past Medical History:  Diagnosis Date  . Arthritis    joints  . Breast cyst   . Cancer ([place]) 2014   right breast cancer and radiation  .  Complication of anesthesia    difficulty waking up-quit breathing during first TDC, bruising on jaw after surgery  . Depression   . GERD (gastroesophageal reflux disease)   . Glaucoma   . Headache    migraines, none for awhile  . History of hiatal hernia   . Hypercholesteremia   . Hypertension    controlled on meds  . IBS (irritable bowel syndrome)   . Iron deficiency anemia   . Joint pain   . Motion sickness    ferry, cars  . Osteoporosis   . Personal history of radiation therapy 2014   Right breast  . Shingles 2012?   right side  . Wears dentures    partial upper (1 tooth)    PAST SURGICAL HISTORY: Past Surgical History:  Procedure Laterality Date  . ABDOMINAL HYSTERECTOMY  1976  . BREAST BIOPSY Right 2014   Invasive Carcinoma  . BREAST LUMPECTOMY Right 2014   + mammo cite rad and aromasin  . BREAST MAMMOSITE Right 08-01-13  . BREAST SURGERY Right 07-02-2013   with sentinal biopsy  . BREAST SURGERY Right 07-18-13  . CARDIAC CATHETERIZATION    . CATARACT EXTRACTION W/ INTRAOCULAR LENS  IMPLANT, BILATERAL    . CATARACT EXTRACTION W/PHACO Left 06/26/2019   Procedure: Neldon [place] DUAL BLADE GONIOTOMY LEFT;  Surgeon: Leandrew Koyanagi, MD;  Location: [facility];  Service: Ophthalmology;  Laterality: Left;  . COLONOSCOPY     Dr  Oh  . ESOPHAGOGASTRODUODENOSCOPY (EGD) WITH PROPOFOL N/A 04/04/2018   Procedure: ESOPHAGOGASTRODUODENOSCOPY (EGD) WITH PROPOFOL;  Surgeon: Toledo, Benay Pike, MD;  Location: [facility];  Service: Gastroenterology;  Laterality: N/A;  . EXCISION VAGINAL CYST    . FOOT SURGERY Left 12/05/14  . HAND SURGERY     trigger finger  . MM BREAST STEREO BX*R*R/S Right 2014  . PHOTOCOAGULATION Left 05/16/2018   Procedure: TRANSCLERAL DIODE CYCLOPHOTOCOAGULATION LEFT IVA BLOCK;  Surgeon: Leandrew Koyanagi, MD;  Location: [place];  Service: Ophthalmology;  Laterality: Left;  IVA BLOCK LEFT  . PHOTOCOAGULATION WITH LASER Left 08/22/2016    Procedure: PHOTOCOAGULATION WITH LASER;  Surgeon: Ronnell Freshwater, MD;  Location: [place];  Service: Ophthalmology;  Laterality: Left;  KEEP PT 2ND    MEDICATIONS: Current Outpatient Medications on File Prior to Visit  Medication Sig Dispense Refill  . amLODipine (NORVASC) 2.5 MG tablet Take 2.5 mg by mouth daily. am    . Biotin 1000 MCG tablet Take 1,000 mcg by mouth daily. am    . cetirizine (ZYRTEC) 10 MG tablet Take 10 mg by mouth every evening.     . dorzolamide-timolol (COSOPT) 22.3-6.8 MG/ML ophthalmic solution Place 1 drop into the left eye 2 (two) times daily.    Marland Kitchen FLUoxetine (PROZAC) 20 MG capsule Take 20 mg by mouth daily. am    . fluticasone (FLONASE) 50 MCG/ACT nasal spray Place 2 sprays into both nostrils daily. am    . LORazepam (ATIVAN) 0.5 MG tablet Take 0.5 mg by mouth at bedtime. And as needed    . losartan (COZAAR) 50 MG tablet Take 50 mg by mouth every evening.     . Meloxicam 15 MG TBDP     . pantoprazole (PROTONIX) 40 MG tablet Take 40 mg by mouth daily. Am and pm     . prednisoLONE acetate (PRED FORTE) 1 % ophthalmic suspension Place 1 drop into the left eye 2 (two) times daily.    . rizatriptan (MAXALT) 10 MG tablet Take 10 mg by mouth as needed for migraine. May repeat in 2 hours if needed    . rosuvastatin (CRESTOR) 5 MG tablet Take 5 mg by mouth every other day.     . Travoprost, BAK Free, (TRAVATAN) 0.004 % SOLN ophthalmic solution Place 1 drop into both eyes at bedtime.    . Vitamin D, Ergocalciferol, (DRISDOL) 50000 UNITS CAPS capsule Take 1 capsule by mouth once a week.     No current facility-administered medications on file prior to visit.    ALLERGIES: Allergies  Allergen Reactions  . Lisinopril Cough       . Simbrinza [Brinzolamide-Brimonidine]     lethargy  . Sulfamethoxazole-Trimethoprim     Other reaction(s): Other (See Comments) HYPONATREMIA  . Tape Other (See Comments)    Redness and irritation    FAMILY  HISTORY: Family History  Problem Relation Age of Onset  . Cancer Mother        age 77 breast  . Breast cancer Mother 21  . Cancer Brother        prostate/lung/ liver /spleen  . Breast cancer Maternal Aunt        age 83  . Colon cancer Paternal Aunt    SOCIAL HISTORY: Social History   Socioeconomic History  . Marital status: Married    Spouse name: Not on file  . Number of children: 5  . Years of education: 19  . Highest education level: Not on file  Occupational  History  . Occupation: Retired  Tobacco Use  . Smoking status: Never Smoker  . Smokeless tobacco: Never Used  Substance and Sexual Activity  . Alcohol use: No  . Drug use: No  . Sexual activity: Not on file  Other Topics Concern  . Not on file  Social History Narrative   Lives with husband   Caffeine use:  none   Social Determinants of Health   Financial Resource Strain:   . Difficulty of Paying Living Expenses:   Food Insecurity:   . Worried About Charity fundraiser in the Last Year:   . Arboriculturist in the Last Year:   Transportation Needs:   . Film/video editor (Medical):   Marland Kitchen Lack of Transportation (Non-Medical):   Physical Activity:   . Days of Exercise per Week:   . Minutes of Exercise per Session:   Stress:   . Feeling of Stress :   Social Connections:   . Frequency of Communication with Friends and Family:   . Frequency of Social Gatherings with Friends and Family:   . Attends Religious Services:   . Active Member of Clubs or Organizations:   . Attends Archivist Meetings:   Marland Kitchen Marital Status:   Intimate Partner Violence:   . Fear of Current or Ex-Partner:   . Emotionally Abused:   Marland Kitchen Physically Abused:   . Sexually Abused:     PHYSICAL EXAM: Blood pressure 138/83, pulse 82, height 5\' 3"  (1.6 m), weight 155 lb 9.6 oz (70.6 kg), SpO2 97 %. General: No acute distress.  Patient appears well-groomed.   Head:  Normocephalic/atraumatic Eyes:  fundi examined but not  visualized Neck: supple, no paraspinal tenderness, full range of motion, tenderness to palpation of left lateral neck and shoulder as well as tenderness to palpation of her left arm. Back: No paraspinal tenderness Heart: regular rate and rhythm Lungs: Clear to auscultation bilaterally. Vascular: No carotid bruits. Neurological Exam: Mental status: alert and oriented to person, place, and time, recent and remote memory intact, fund of knowledge intact, attention and concentration intact, speech fluent and not dysarthric, language intact. Cranial nerves: CN I: not tested CN II: pupils equal, round and reactive to light, visual fields intact CN III, IV, VI:  full range of motion, no nystagmus, no ptosis CN V: facial sensation intact CN VII: upper and lower face symmetric CN VIII: hearing intact CN IX, X: gag intact, uvula midline CN XI: sternocleidomastoid and trapezius muscles intact CN XII: tongue midline Bulk & Tone: normal, no fasciculations. Motor:  5/5 throughout  Sensation:  Temperature and vibration sensation intact.  Deep Tendon Reflexes:  2+ throughout, toes downgoing.  Finger to nose testing:  Without dysmetria.   Heel to shin:  Without dysmetria.  * Gait:  Mildly wide-based..  Able to turn.  Romberg with sway.  IMPRESSION: Left sided neck pain/facial pain/arm pain.  Not consistent with trigeminal neuralgia.  Pain not necessarily neuralgic in quality.  I would like to evaluate for pathology in the neck, as well as intracranial etiology.  PLAN: MRI of brain and cervical spine with and without contrast. Further recommendations pending results.  Thank you for allowing me to take part in the care of this patient.  Metta Clines, DO  CC:  [name], MD  [name], MD

## 2020-05-12 ENCOUNTER — Encounter: Payer: Self-pay | Admitting: Neurology

## 2020-05-12 ENCOUNTER — Ambulatory Visit: Payer: Medicare PPO | Admitting: Neurology

## 2020-05-12 ENCOUNTER — Other Ambulatory Visit: Payer: Self-pay

## 2020-05-12 VITALS — BP 138/83 | HR 82 | Ht 63.0 in | Wt 155.6 lb

## 2020-05-12 DIAGNOSIS — R519 Headache, unspecified: Secondary | ICD-10-CM | POA: Diagnosis not present

## 2020-05-12 DIAGNOSIS — M542 Cervicalgia: Secondary | ICD-10-CM

## 2020-05-12 NOTE — Patient Instructions (Addendum)
MRI of brain and cervical spine with and without contrast. We have sent a referral to [facility] for your MRI and they will call you directly to schedule your appointment. They are located at [place]. If you need to contact them directly please call [phone].  Further recommendations pending results.

## 2020-05-18 ENCOUNTER — Other Ambulatory Visit
Admission: RE | Admit: 2020-05-18 | Discharge: 2020-05-18 | Disposition: A | Discharge status: Home / Self Care | Payer: Medicare PPO | Source: Ambulatory Visit | Attending: General Surgery | Admitting: General Surgery

## 2020-05-18 ENCOUNTER — Other Ambulatory Visit: Payer: Self-pay

## 2020-05-18 DIAGNOSIS — Z20822 Contact with and (suspected) exposure to covid-19: Secondary | ICD-10-CM | POA: Insufficient documentation

## 2020-05-18 DIAGNOSIS — Z01812 Encounter for preprocedural laboratory examination: Secondary | ICD-10-CM | POA: Diagnosis present

## 2020-05-18 LAB — SARS CORONAVIRUS 2 (TAT 6-24 HRS): SARS Coronavirus 2: NEGATIVE

## 2020-05-20 ENCOUNTER — Ambulatory Visit: Payer: Medicare PPO | Admitting: Anesthesiology

## 2020-05-20 ENCOUNTER — Encounter
Admission: RE | Disposition: A | Discharge status: Home / Self Care | Payer: Self-pay | Source: Home / Self Care | Attending: General Surgery

## 2020-05-20 ENCOUNTER — Ambulatory Visit
Admission: RE | Admit: 2020-05-20 | Discharge: 2020-05-20 | Disposition: A | Discharge status: Home / Self Care | Payer: Medicare PPO | Attending: General Surgery | Admitting: General Surgery

## 2020-05-20 ENCOUNTER — Encounter: Payer: Self-pay | Admitting: General Surgery

## 2020-05-20 DIAGNOSIS — D124 Benign neoplasm of descending colon: Secondary | ICD-10-CM | POA: Insufficient documentation

## 2020-05-20 DIAGNOSIS — Z79899 Other long term (current) drug therapy: Secondary | ICD-10-CM | POA: Diagnosis not present

## 2020-05-20 DIAGNOSIS — K5730 Diverticulosis of large intestine without perforation or abscess without bleeding: Secondary | ICD-10-CM | POA: Insufficient documentation

## 2020-05-20 DIAGNOSIS — K219 Gastro-esophageal reflux disease without esophagitis: Secondary | ICD-10-CM | POA: Diagnosis not present

## 2020-05-20 DIAGNOSIS — Z791 Long term (current) use of non-steroidal anti-inflammatories (NSAID): Secondary | ICD-10-CM | POA: Insufficient documentation

## 2020-05-20 DIAGNOSIS — I10 Essential (primary) hypertension: Secondary | ICD-10-CM | POA: Insufficient documentation

## 2020-05-20 DIAGNOSIS — R194 Change in bowel habit: Secondary | ICD-10-CM | POA: Insufficient documentation

## 2020-05-20 DIAGNOSIS — F329 Major depressive disorder, single episode, unspecified: Secondary | ICD-10-CM | POA: Insufficient documentation

## 2020-05-20 DIAGNOSIS — K644 Residual hemorrhoidal skin tags: Secondary | ICD-10-CM | POA: Diagnosis not present

## 2020-05-20 DIAGNOSIS — M1990 Unspecified osteoarthritis, unspecified site: Secondary | ICD-10-CM | POA: Diagnosis not present

## 2020-05-20 DIAGNOSIS — Z923 Personal history of irradiation: Secondary | ICD-10-CM | POA: Insufficient documentation

## 2020-05-20 DIAGNOSIS — E7800 Pure hypercholesterolemia, unspecified: Secondary | ICD-10-CM | POA: Diagnosis not present

## 2020-05-20 DIAGNOSIS — Z853 Personal history of malignant neoplasm of breast: Secondary | ICD-10-CM | POA: Diagnosis not present

## 2020-05-20 DIAGNOSIS — H409 Unspecified glaucoma: Secondary | ICD-10-CM | POA: Insufficient documentation

## 2020-05-20 HISTORY — PX: COLONOSCOPY WITH PROPOFOL: SHX5780

## 2020-05-20 SURGERY — COLONOSCOPY WITH PROPOFOL
Anesthesia: General

## 2020-05-20 MED ORDER — SODIUM CHLORIDE 0.9 % IV SOLN: INTRAVENOUS | Status: DC

## 2020-05-20 MED ORDER — LIDOCAINE HCL (CARDIAC) PF 100 MG/5ML IV SOSY
PREFILLED_SYRINGE | INTRAVENOUS | Status: DC | PRN
  Administered 2020-05-20: 100 mg via INTRAVENOUS

## 2020-05-20 MED ORDER — PROPOFOL 500 MG/50ML IV EMUL
INTRAVENOUS | Status: DC | PRN
  Administered 2020-05-20: 155 ug/kg/min via INTRAVENOUS

## 2020-05-20 MED ORDER — PROPOFOL 10 MG/ML IV BOLUS
INTRAVENOUS | Status: DC | PRN
  Administered 2020-05-20: 5 mg via INTRAVENOUS
  Administered 2020-05-20: 40 mg via INTRAVENOUS

## 2020-05-20 NOTE — H&P (Addendum)
Abigail Shaw [id number] [birth year]     HPI: 85 y/o with recent change in bowel habits, more difficulty with complete elimination.  Had some mild incontinence when taking MiraLax and Fiber supplements at the same time.   N&V with prep, resolved with time.  Able to finish the prep. Reports last BM was clear, Gatoraid colored.   Sister with polyps, second degree relatives with colon cancer.   Medications Prior to Admission  Medication Sig Dispense Refill Last Dose  . amLODipine (NORVASC) 2.5 MG tablet Take 2.5 mg by mouth daily. am   05/20/2020 at 0600  . losartan (COZAAR) 50 MG tablet Take 50 mg by mouth every evening.    05/19/2020 at Unknown time  . Biotin 1000 MCG tablet Take 1,000 mcg by mouth daily. am     . cetirizine (ZYRTEC) 10 MG tablet Take 10 mg by mouth every evening.      . dorzolamide-timolol (COSOPT) 22.3-6.8 MG/ML ophthalmic solution Place 1 drop into the left eye 2 (two) times daily.     Marland Kitchen FLUoxetine (PROZAC) 20 MG capsule Take 20 mg by mouth daily. am     . fluticasone (FLONASE) 50 MCG/ACT nasal spray Place 2 sprays into both nostrils daily. am     . LORazepam (ATIVAN) 0.5 MG tablet Take 0.5 mg by mouth at bedtime. And as needed     . Meloxicam 15 MG TBDP      . pantoprazole (PROTONIX) 40 MG tablet Take 40 mg by mouth daily. Am and pm      . prednisoLONE acetate (PRED FORTE) 1 % ophthalmic suspension Place 1 drop into the left eye 2 (two) times daily.     . rizatriptan (MAXALT) 10 MG tablet Take 10 mg by mouth as needed for migraine. May repeat in 2 hours if needed     . rosuvastatin (CRESTOR) 5 MG tablet Take 5 mg by mouth every other day.      . Travoprost, BAK Free, (TRAVATAN) 0.004 % SOLN ophthalmic solution Place 1 drop into both eyes at bedtime.     . Vitamin D, Ergocalciferol, (DRISDOL) 50000 UNITS CAPS capsule Take 1 capsule by mouth once a week.      Allergies  Allergen Reactions  . Lisinopril Cough       . Simbrinza [Brinzolamide-Brimonidine]     lethargy   . Sulfamethoxazole-Trimethoprim     Other reaction(s): Other (See Comments) HYPONATREMIA  . Tape Other (See Comments)    Redness and irritation   Past Medical History:  Diagnosis Date  . Arthritis    joints  . Breast cyst   . Cancer ([place]) 2014   right breast cancer and radiation  . Complication of anesthesia    difficulty waking up-quit breathing during first TDC, bruising on jaw after surgery  . Depression   . GERD (gastroesophageal reflux disease)   . Glaucoma   . Headache    migraines, none for awhile  . History of hiatal hernia   . Hypercholesteremia   . Hypertension    controlled on meds  . IBS (irritable bowel syndrome)   . Iron deficiency anemia   . Joint pain   . Motion sickness    fer[place], cars  . Osteoporosis   . Personal history of radiation therapy 2014   Right breast  . Shingles 2012?   right side  . Wears dentures    partial upper (1 tooth)   Past Surgical History:  Procedure Laterality Date  . ABDOMINAL HYSTERECTOMY  [place] BIOPSY Right 2014   Invasive Carcinoma  . BREAST LUMPECTOMY Right 2014   + mammo cite rad and aromasin  . BREAST MAMMOSITE Right 08-01-13  . BREAST SURGERY Right 07-02-2013   with sentinal biopsy  . BREAST SURGERY Right 07-18-13  . CARDIAC CATHETERIZATION    . CATARACT EXTRACTION W/ INTRAOCULAR LENS  IMPLANT, BILATERAL    . CATARACT EXTRACTION W/PHACO Left 06/26/2019   Procedure: Neldon [place] DUAL BLADE GONIOTOMY LEFT;  Surgeon: Leandrew Koyanagi, MD;  Location: [facility];  Service: Ophthalmology;  Laterality: Left;  . COLONOSCOPY     Dr Candace Cruise  . ESOPHAGOGASTRODUODENOSCOPY (EGD) WITH PROPOFOL N/A 04/04/2018   Procedure: ESOPHAGOGASTRODUODENOSCOPY (EGD) WITH PROPOFOL;  Surgeon: Toledo, Benay Pike, MD;  Location: [facility];  Service: Gastroenterology;  Laterality: N/A;  . EXCISION VAGINAL CYST    . FOOT SURGERY Left 12/05/14  . HAND SURGERY     trigger finger  . MM BREAST STEREO BX*R*R/S Right 2014  .  PHOTOCOAGULATION Left 05/16/2018   Procedure: TRANSCLERAL DIODE CYCLOPHOTOCOAGULATION LEFT IVA BLOCK;  Surgeon: Leandrew Koyanagi, MD;  Location: [facility];  Service: Ophthalmology;  Laterality: Left;  IVA BLOCK LEFT  . PHOTOCOAGULATION WITH LASER Left 08/22/2016   Procedure: PHOTOCOAGULATION WITH LASER;  Surgeon: Ronnell Freshwater, MD;  Location: [place];  Service: Ophthalmology;  Laterality: Left;  KEEP PT 2ND   Social History   Socioeconomic History  . Marital status: Married    Spouse name: Not on file  . Number of children: 5  . Years of education: 43  . Highest education level: Not on file  Occupational History  . Occupation: Retired  Tobacco Use  . Smoking status: Never Smoker  . Smokeless tobacco: Never Used  Substance and Sexual Activity  . Alcohol use: No  . Drug use: No  . Sexual activity: Not on file  Other Topics Concern  . Not on file  Social History Narrative   Lives with husband   Caffeine use:  None   Right handed   Social Determinants of Health   Financial Resource Strain:   . Difficulty of Paying Living Expenses:   Food Insecurity:   . Worried About Charity fundraiser in the Last Year:   . Arboriculturist in the Last Year:   Transportation Needs:   . Film/video editor (Medical):   Marland Kitchen Lack of Transportation (Non-Medical):   Physical Activity:   . Days of Exercise per Week:   . Minutes of Exercise per Session:   Stress:   . Feeling of Stress :   Social Connections:   . Frequency of Communication with Friends and Family:   . Frequency of Social Gatherings with Friends and Family:   . Attends Religious Services:   . Active Member of Clubs or Organizations:   . Attends Archivist Meetings:   Marland Kitchen Marital Status:   Intimate Partner Violence:   . Fear of Current or Ex-Partner:   . Emotionally Abused:   Marland Kitchen Physically Abused:   . Sexually Abused:    Social History   Social History Narrative   Lives with  husband   Caffeine use:  None   Right handed     ROS: Negative.     PE: HEENT: Negative. Lungs: Clear. Cardio: RR.   Assessment/Plan:  Proceed with planned endoscopy.   Forest Gleason [facility] 05/20/2020

## 2020-05-20 NOTE — Brief Op Note (Signed)
Cardiopulmonary at bedside for 12-lead ECG ordered by Dr. Randa Lynn for irregular heartbeat. She spoke with [facility] who said send pt home if HR is under 100bpms and for pt to follow up with him upon release from endo. Pt voiced understanding.

## 2020-05-20 NOTE — Op Note (Signed)
[facility] Patient Name: Abigail Shaw Procedure Date: 05/20/2020 8:12 AM MRN: [id number] Account #: [id number] Date of Birth: December 15, 1935 Admit Type: Outpatient Age: 85 Room: [facility] ENDO ROOM 1 Gender: Female Note Status: Finalized Procedure:             Colonoscopy Indications:           Change in bowel habits Providers:             Robert Bellow, MD Referring MD:          Irven Easterly. Kary Kos, MD (Referring MD) Medicines:             Monitored Anesthesia Care Complications:         No immediate complications. Procedure:             Pre-Anesthesia Assessment:                        - Prior to the procedure, a History and Physical was                         performed, and patient medications, allergies and                         sensitivities were reviewed. The patient's tolerance                         of previous anesthesia was reviewed.                        - The risks and benefits of the procedure and the                         sedation options and risks were discussed with the                         patient. All questions were answered and informed                         consent was obtained.                        After obtaining informed consent, the colonoscope was                         passed under direct vision. Throughout the procedure,                         the patient's blood pressure, pulse, and oxygen                         saturations were monitored continuously. The                         Colonoscope was introduced through the anus and                         advanced to the the cecum, identified by appendiceal                         orifice and ileocecal  valve. The colonoscopy was                         performed without difficulty. The patient tolerated                         the procedure well. The quality of the bowel                         preparation was excellent. Findings:      A 5 mm polyp was found  in the descending colon. The polyp was sessile.       Biopsies were taken with a cold forceps for histology.      A few medium-mouthed diverticula were found in the sigmoid colon.      The retroflexed view of the distal rectum and anal verge was normal and       showed no anal or rectal abnormalities.      The perianal exam findings include non-thrombosed external hemorrhoids. Impression:            - One 5 mm polyp in the descending colon. Biopsied.                        - Diverticulosis in the sigmoid colon.                        - The distal rectum and anal verge are normal on                         retroflexion view.                        - Non-thrombosed external hemorrhoids found on                         perianal exam. Recommendation:        - Telephone endoscopist for pathology results in 1                         week. Procedure Code(s):     --- Professional ---                        [phone], Colonoscopy, flexible; with biopsy, single or                         multiple Diagnosis Code(s):     --- Professional ---                        K63.5, Polyp of colon                        K64.4, Residual hemorrhoidal skin tags                        R19.4, Change in bowel habit                        K57.30, Diverticulosis of large intestine without  perforation or abscess without bleeding CPT copyright 2019 American Medical Association. All rights reserved. The codes documented in this report are preliminary and upon coder review may  be revised to meet current compliance requirements. Robert Bellow, MD 05/20/2020 8:53:06 AM This report has been signed electronically. Number of Addenda: 0 Note Initiated On: 05/20/2020 8:12 AM Scope Withdrawal Time: 0 hours 9 minutes 59 seconds  Total Procedure Duration: 0 hours 20 minutes 20 seconds  Estimated Blood Loss:  Estimated blood loss: none.      [facility]

## 2020-05-20 NOTE — Anesthesia Postprocedure Evaluation (Signed)
Anesthesia Post Note  Patient: Abigail Shaw  Procedure(s) Performed: COLONOSCOPY WITH PROPOFOL (N/A )  Patient location during evaluation: Endoscopy Anesthesia Type: General Level of consciousness: awake and alert and oriented Pain management: pain level controlled Vital Signs Assessment: post-procedure vital signs reviewed and stable Respiratory status: spontaneous breathing, nonlabored ventilation and respiratory function stable Cardiovascular status: blood pressure returned to baseline and stable Postop Assessment: no signs of nausea or vomiting Anesthetic complications: no   Pt in afib post procedure, 12 lead EKG obtained, rate controlled. Talked with pt's cardiologist, Dr. Clayborn Bigness, over the phone and he recommended outpatient followup. Discussed with patient and she will call and schedule appointment.   Last Vitals:  Vitals:   05/20/20 0913 05/20/20 0923  BP: (!) 150/68 (!) 156/98  Pulse: 61 87  Resp: 16 (!) 27  Temp:    SpO2: 100% 97%    Last Pain:  Vitals:   05/20/20 0923  TempSrc:   PainSc: 0-No pain                 Richy Spradley

## 2020-05-20 NOTE — Anesthesia Preprocedure Evaluation (Signed)
Anesthesia Evaluation  Patient identified by MRN, date of birth, ID band Patient awake    Reviewed: Allergy & Precautions, NPO status , Patient's Chart, lab work & pertinent test results  History of Anesthesia Complications Negative for: history of anesthetic complications  Airway Mallampati: II  TM Distance: >3 FB Neck ROM: Full    Dental  (+) Missing   Pulmonary neg pulmonary ROS, neg sleep apnea, neg COPD,    breath sounds clear to auscultation- rhonchi (-) wheezing      Cardiovascular hypertension, Pt. on medications (-) CAD, (-) Past MI, (-) Cardiac Stents and (-) CABG  Rhythm:Regular Rate:Normal - Systolic murmurs and - Diastolic murmurs    Neuro/Psych  Headaches, PSYCHIATRIC DISORDERS Depression    GI/Hepatic Neg liver ROS, hiatal hernia, GERD  ,  Endo/Other  negative endocrine ROSneg diabetes  Renal/GU negative Renal ROS     Musculoskeletal  (+) Arthritis ,   Abdominal (+) - obese,   Peds  Hematology  (+) anemia ,   Anesthesia Other Findings Past Medical History: No date: Arthritis     Comment:  joints No date: Breast cyst 2014: Cancer ([place])     Comment:  right breast cancer and radiation No date: Complication of anesthesia     Comment:  difficulty waking up-quit breathing during first TDC,               bruising on jaw after surgery No date: Depression No date: GERD (gastroesophageal reflux disease) No date: Glaucoma No date: Headache     Comment:  migraines, none for awhile No date: History of hiatal hernia No date: Hypercholesteremia No date: Hypertension     Comment:  controlled on meds No date: IBS (irritable bowel syndrome) No date: Iron deficiency anemia No date: Joint pain No date: Motion sickness     Comment:  fer[place], cars No date: Osteoporosis 2014: Personal history of radiation therapy     Comment:  Right breast 2012?: Shingles     Comment:  right side No date: Wears dentures   Comment:  partial upper (1 tooth)   Reproductive/Obstetrics                             Anesthesia Physical Anesthesia Plan  ASA: II  Anesthesia Plan: General   Post-op Pain Management:    Induction: Intravenous  PONV Risk Score and Plan: 2 and Propofol infusion  Airway Management Planned: Natural Airway  Additional Equipment:   Intra-op Plan:   Post-operative Plan:   Informed Consent: I have reviewed the patients History and Physical, chart, labs and discussed the procedure including the risks, benefits and alternatives for the proposed anesthesia with the patient or authorized representative who has indicated his/her understanding and acceptance.     Dental advisory given  Plan Discussed with: CRNA and Anesthesiologist  Anesthesia Plan Comments:         Anesthesia Quick Evaluation

## 2020-05-20 NOTE — Transfer of Care (Signed)
Immediate Anesthesia Transfer of Care Note  Patient: KYNNA EALY  Procedure(s) Performed: COLONOSCOPY WITH PROPOFOL (N/A )  Patient Location: Endoscopy Unit  Anesthesia Type:General  Level of Consciousness: drowsy and responds to stimulation  Airway & Oxygen Therapy: Patient Spontanous Breathing and Patient connected to face mask oxygen  Post-op Assessment: Report given to RN and Post -op Vital signs reviewed and stable  Post vital signs: Reviewed and stable  Last Vitals:  Vitals Value Taken Time  BP 134/77 05/20/20 0853  Temp 36.6 C 05/20/20 0853  Pulse 69 05/20/20 0854  Resp 10 05/20/20 0854  SpO2 100 % 05/20/20 0854  Vitals shown include unvalidated device data.  Last Pain:  Vitals:   05/20/20 0853  TempSrc: Temporal  PainSc: 0-No pain         Complications: No apparent anesthesia complications

## 2020-05-21 ENCOUNTER — Encounter: Payer: Self-pay | Admitting: *Deleted

## 2020-05-21 LAB — SURGICAL PATHOLOGY

## 2020-06-03 ENCOUNTER — Ambulatory Visit
Admission: RE | Admit: 2020-06-03 | Discharge: 2020-06-03 | Disposition: A | Discharge status: Home / Self Care | Payer: Medicare PPO | Source: Ambulatory Visit | Attending: Oncology | Admitting: Oncology

## 2020-06-03 DIAGNOSIS — Z1231 Encounter for screening mammogram for malignant neoplasm of breast: Secondary | ICD-10-CM | POA: Diagnosis present

## 2020-06-03 DIAGNOSIS — Z853 Personal history of malignant neoplasm of breast: Secondary | ICD-10-CM | POA: Diagnosis not present

## 2020-06-03 DIAGNOSIS — Z08 Encounter for follow-up examination after completed treatment for malignant neoplasm: Secondary | ICD-10-CM | POA: Diagnosis not present

## 2020-06-08 ENCOUNTER — Ambulatory Visit: Payer: Medicare PPO | Admitting: Neurology

## 2020-06-12 ENCOUNTER — Ambulatory Visit
Admission: RE | Admit: 2020-06-12 | Discharge: 2020-06-12 | Disposition: A | Discharge status: Home / Self Care | Payer: Medicare PPO | Source: Ambulatory Visit | Attending: Neurology | Admitting: Neurology

## 2020-06-12 ENCOUNTER — Other Ambulatory Visit: Payer: Self-pay

## 2020-06-12 DIAGNOSIS — R519 Headache, unspecified: Secondary | ICD-10-CM

## 2020-06-12 DIAGNOSIS — M542 Cervicalgia: Secondary | ICD-10-CM

## 2020-06-12 MED ORDER — GADOBENATE DIMEGLUMINE 529 MG/ML IV SOLN
14.0000 mL | Freq: Once | INTRAVENOUS | Status: AC | PRN
  Administered 2020-06-12: 14 mL via INTRAVENOUS

## 2020-08-13 ENCOUNTER — Other Ambulatory Visit: Payer: Self-pay

## 2020-08-13 ENCOUNTER — Inpatient Hospital Stay: Payer: Medicare PPO | Attending: Oncology

## 2020-08-13 DIAGNOSIS — D509 Iron deficiency anemia, unspecified: Secondary | ICD-10-CM | POA: Insufficient documentation

## 2020-08-13 DIAGNOSIS — Z08 Encounter for follow-up examination after completed treatment for malignant neoplasm: Secondary | ICD-10-CM

## 2020-08-13 LAB — IRON AND TIBC
Iron: 83 ug/dL (ref 28–170)
Saturation Ratios: 27 % (ref 10.4–31.8)
TIBC: 312 ug/dL (ref 250–450)
UIBC: 229 ug/dL

## 2020-08-13 LAB — CBC WITH DIFFERENTIAL/PLATELET
Abs Immature Granulocytes: 0.01 10*3/uL (ref 0.00–0.07)
Basophils Absolute: 0 10*3/uL (ref 0.0–0.1)
Basophils Relative: 1 %
Eosinophils Absolute: 0.1 10*3/uL (ref 0.0–0.5)
Eosinophils Relative: 1 %
HCT: 36.6 % (ref 36.0–46.0)
Hemoglobin: 12.5 g/dL (ref 12.0–15.0)
Immature Granulocytes: 0 %
Lymphocytes Relative: 29 %
Lymphs Abs: 1.6 10*3/uL (ref 0.7–4.0)
MCH: 28.7 pg (ref 26.0–34.0)
MCHC: 34.2 g/dL (ref 30.0–36.0)
MCV: 84.1 fL (ref 80.0–100.0)
Monocytes Absolute: 0.5 10*3/uL (ref 0.1–1.0)
Monocytes Relative: 10 %
Neutro Abs: 3.2 10*3/uL (ref 1.7–7.7)
Neutrophils Relative %: 59 %
Platelets: 212 10*3/uL (ref 150–400)
RBC: 4.35 MIL/uL (ref 3.87–5.11)
RDW: 13.9 % (ref 11.5–15.5)
WBC: 5.3 10*3/uL (ref 4.0–10.5)
nRBC: 0 % (ref 0.0–0.2)

## 2020-08-13 LAB — FERRITIN: Ferritin: 280 ng/mL (ref 11–307)

## 2020-08-14 ENCOUNTER — Other Ambulatory Visit: Payer: Medicare PPO

## 2020-08-14 ENCOUNTER — Inpatient Hospital Stay (HOSPITAL_BASED_OUTPATIENT_CLINIC_OR_DEPARTMENT_OTHER): Payer: Medicare PPO | Admitting: Oncology

## 2020-08-14 DIAGNOSIS — D509 Iron deficiency anemia, unspecified: Secondary | ICD-10-CM

## 2020-08-17 ENCOUNTER — Encounter: Payer: Self-pay | Admitting: Oncology

## 2020-08-17 NOTE — Progress Notes (Signed)
I connected with Abigail Shaw on 08/17/20 at 11:30 AM EDT by video enabled telemedicine visit and verified that I am speaking with the correct person using two identifiers.   I discussed the limitations, risks, security and privacy concerns of performing an evaluation and management service by telemedicine and the availability of in-person appointments. I also discussed with the patient that there may be a patient responsible charge related to this service. The patient expressed understanding and agreed to proceed.  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 She did not require adjuvant chemotherapy as she had a low Oncotype score. She also has a history of osteoporosis for which she is on calcium and vitamin D. She was previously on Fosamax which was stopped and patient did not want to take any parenteral bisphosphonates. Patient has a history of iron deficiency anemia.    Interval history patient reports doing well and denies any complaints at this time.  Denies any bleeding in her stool or urine.  Denies any dark melanotic stools   Review of Systems  Constitutional: Negative for chills, fever, malaise/fatigue and weight loss.  HENT: Negative for congestion, ear discharge and nosebleeds.   Eyes: Negative for blurred vision.  Respiratory: Negative for cough, hemoptysis, sputum production, shortness of breath and wheezing.   Cardiovascular: Negative for chest pain, palpitations, orthopnea and  claudication.  Gastrointestinal: Negative for abdominal pain, blood in stool, constipation, diarrhea, heartburn, melena, nausea and vomiting.  Genitourinary: Negative for dysuria, flank pain, frequency, hematuria and urgency.  Musculoskeletal: Negative for back pain, joint pain and myalgias.  Skin: Negative for rash.  Neurological: Negative for dizziness, tingling, focal weakness, seizures, weakness and headaches.  Endo/Heme/Allergies: Does not bruise/bleed easily.  Psychiatric/Behavioral: Negative for depression and suicidal ideas. The patient does not have insomnia.     Allergies  Allergen Reactions  . Lisinopril Cough       . Simbrinza [Brinzolamide-Brimonidine]     lethargy  . Sulfamethoxazole-Trimethoprim     Other reaction(s): Other (See Comments) HYPONATREMIA  . Tape Other (See Comments)    Redness and irritation    Past Medical History:  Diagnosis Date  . Arthritis    joints  . Breast cyst   . Cancer ([place]) 2014   right breast cancer and radiation  . Complication of anesthesia    difficulty waking up-quit breathing during first TDC, bruising on jaw after surgery  . Depression   . GERD (gastroesophageal reflux disease)   . Glaucoma   . Headache    migraines, none for awhile  . History of hiatal hernia   . Hypercholesteremia   . Hypertension    controlled on meds  . IBS (irritable bowel syndrome)   . Iron deficiency anemia   . Joint pain   . Motion sickness    ferry, cars  . Osteoporosis   . Personal history of radiation therapy 2014   Right breast  . Shingles 2012?   right side  . Wears dentures    partial upper (1 tooth)  Past Surgical History:  Procedure Laterality Date  . ABDOMINAL HYSTERECTOMY  1976  . BREAST BIOPSY Right 2014   Invasive Carcinoma  . BREAST LUMPECTOMY Right 2014   + mammo cite rad and aromasin  . BREAST MAMMOSITE Right 08-01-13  . BREAST SURGERY Right 07-02-2013   with sentinal biopsy  . BREAST SURGERY Right 07-18-13  . CARDIAC  CATHETERIZATION    . CATARACT EXTRACTION W/ INTRAOCULAR LENS  IMPLANT, BILATERAL    . CATARACT EXTRACTION W/PHACO Left 06/26/2019   Procedure: Neldon Newport DUAL BLADE GONIOTOMY LEFT;  Surgeon: Leandrew Koyanagi, MD;  Location: [place];  Service: Ophthalmology;  Laterality: Left;  . COLONOSCOPY     Dr Candace Cruise  . COLONOSCOPY WITH PROPOFOL N/A 05/20/2020   Procedure: COLONOSCOPY WITH PROPOFOL;  Surgeon: Robert Bellow, MD;  Location: [facility] ENDOSCOPY;  Service: Endoscopy;  Laterality: N/A;  . ESOPHAGOGASTRODUODENOSCOPY (EGD) WITH PROPOFOL N/A 04/04/2018   Procedure: ESOPHAGOGASTRODUODENOSCOPY (EGD) WITH PROPOFOL;  Surgeon: Toledo, Benay Pike, MD;  Location: [facility] ENDOSCOPY;  Service: Gastroenterology;  Laterality: N/A;  . EXCISION VAGINAL CYST    . FOOT SURGERY Left 12/05/14  . HAND SURGERY     trigger finger  . MM BREAST STEREO BX*R*R/S Right 2014  . PHOTOCOAGULATION Left 05/16/2018   Procedure: TRANSCLERAL DIODE CYCLOPHOTOCOAGULATION LEFT IVA BLOCK;  Surgeon: Leandrew Koyanagi, MD;  Location: [place];  Service: Ophthalmology;  Laterality: Left;  IVA BLOCK LEFT  . PHOTOCOAGULATION WITH LASER Left 08/22/2016   Procedure: PHOTOCOAGULATION WITH LASER;  Surgeon: Ronnell Freshwater, MD;  Location: [place];  Service: Ophthalmology;  Laterality: Left;  KEEP PT 2ND    Social History   Socioeconomic History  . Marital status: Married    Spouse name: Not on file  . Number of children: 5  . Years of education: 41  . Highest education level: Not on file  Occupational History  . Occupation: Retired  Tobacco Use  . Smoking status: Never Smoker  . Smokeless tobacco: Never Used  Vaping Use  . Vaping Use: Never used  Substance and Sexual Activity  . Alcohol use: No  . Drug use: No  . Sexual activity: Not on file  Other Topics Concern  . Not on file  Social History Narrative   Lives with husband   Caffeine use:  None   Right handed   Social Determinants of  Health   Financial Resource Strain:   . Difficulty of Paying Living Expenses: Not on file  Food Insecurity:   . Worried About Charity fundraiser in the Last Year: Not on file  . Ran Out of Food in the Last Year: Not on file  Transportation Needs:   . Lack of Transportation (Medical): Not on file  . Lack of Transportation (Non-Medical): Not on file  Physical Activity:   . Days of Exercise per Week: Not on file  . Minutes of Exercise per Session: Not on file  Stress:   . Feeling of Stress : Not on file  Social Connections:   . Frequency of Communication with Friends and Family: Not on file  . Frequency of Social Gatherings with Friends and Family: Not on file  . Attends Religious Services: Not on file  . Active Member of Clubs or Organizations: Not on file  . Attends Archivist Meetings: Not on file  . Marital Status: Not on file  Intimate Partner Violence:   . Fear of Current or Ex-Partner: Not on file  . Emotionally Abused: Not on  file  . Physically Abused: Not on file  . Sexually Abused: Not on file    Family History  Problem Relation Age of Onset  . Cancer Mother        age 39 breast  . Breast cancer Mother 37  . Cancer Brother        prostate/lung/ liver /spleen  . Breast cancer Maternal Aunt        age 48  . Colon cancer Paternal Aunt      Current Outpatient Medications:  .  amLODipine (NORVASC) 2.5 MG tablet, Take 2.5 mg by mouth daily. am, Disp: , Rfl:  .  Biotin 1000 MCG tablet, Take 1,000 mcg by mouth daily. am, Disp: , Rfl:  .  cetirizine (ZYRTEC) 10 MG tablet, Take 10 mg by mouth every evening. , Disp: , Rfl:  .  dorzolamide-timolol (COSOPT) 22.3-6.8 MG/ML ophthalmic solution, Place 1 drop into the left eye 2 (two) times daily., Disp: , Rfl:  .  FLUoxetine (PROZAC) 20 MG capsule, Take 20 mg by mouth daily. am, Disp: , Rfl:  .  fluticasone (FLONASE) 50 MCG/ACT nasal spray, Place 2 sprays into both nostrils daily. am, Disp: , Rfl:  .  LORazepam  (ATIVAN) 0.5 MG tablet, Take 0.5 mg by mouth at bedtime. And as needed, Disp: , Rfl:  .  losartan (COZAAR) 50 MG tablet, Take 50 mg by mouth every evening. , Disp: , Rfl:  .  Meloxicam 15 MG TBDP, , Disp: , Rfl:  .  pantoprazole (PROTONIX) 40 MG tablet, Take 40 mg by mouth daily. Am and pm , Disp: , Rfl:  .  prednisoLONE acetate (PRED FORTE) 1 % ophthalmic suspension, Place 1 drop into the left eye 2 (two) times daily., Disp: , Rfl:  .  rizatriptan (MAXALT) 10 MG tablet, Take 10 mg by mouth as needed for migraine. May repeat in 2 hours if needed, Disp: , Rfl:  .  rosuvastatin (CRESTOR) 5 MG tablet, Take 5 mg by mouth every other day. , Disp: , Rfl:  .  Travoprost, BAK Free, (TRAVATAN) 0.004 % SOLN ophthalmic solution, Place 1 drop into both eyes at bedtime., Disp: , Rfl:  .  Vitamin D, Ergocalciferol, (DRISDOL) 50000 UNITS CAPS capsule, Take 1 capsule by mouth once a week., Disp: , Rfl:   No results found.  No images are attached to the encounter.   CMP Latest Ref Rng & Units 09/27/2019  Glucose 70 - 99 mg/dL 95  BUN 8 - 23 mg/dL 15  Creatinine 0.44 - 1.00 mg/dL 0.76  Sodium 135 - 145 mmol/L 134(L)  Potassium 3.5 - 5.1 mmol/L 4.0  Chloride 98 - 111 mmol/L 99  CO2 22 - 32 mmol/L 27  Calcium 8.9 - 10.3 mg/dL 10.0  Total Protein 6.5 - 8.1 g/dL 6.9  Total Bilirubin 0.3 - 1.2 mg/dL 0.5  Alkaline Phos 38 - 126 U/L 88  AST 15 - 41 U/L 24  ALT 0 - 44 U/L 22   CBC Latest Ref Rng & Units 08/13/2020  WBC 4.0 - 10.5 K/uL 5.3  Hemoglobin 12.0 - 15.0 g/dL 12.5  Hematocrit 36 - 46 % 36.6  Platelets 150 - 400 K/uL 212     Assessment and plan: Patient is an 85 year old female with history of iron deficiency anemia and this is a routine follow-up visit  Patient's hemoglobin has remained stable around 12 over the last 1 year.  She last received IV iron back in October 2019.  Iron studies are presently  normal and she does not require any IV iron at this time.  Given the stability of her hemoglobin  and the fact that she has not received IV iron for about 2 years now she can continue to follow-up with Dr. Kary Kos.  She does not require any follow-up with hematology at this time but can be referred to [place] in the future if questions or concerns arise  Follow-up instructions: No follow-up needed I discussed the assessment and treatment plan with the patient. The patient was provided an opportunity to ask questions and all were answered. The patient agreed with the plan and demonstrated an understanding of the instructions.   The patient was advised to call back or seek an in-person evaluation if the symptoms worsen or if the condition fails to improve as anticipated.   Visit Diagnosis: No diagnosis found.  Dr. [name], MD, MPH [place] at [facility] Tel- [phone] 08/17/2020 7:54 AM

## 2021-03-08 ENCOUNTER — Ambulatory Visit: Payer: Self-pay | Admitting: Podiatry

## 2021-03-10 ENCOUNTER — Encounter: Payer: Self-pay | Admitting: Podiatry

## 2021-03-10 ENCOUNTER — Ambulatory Visit: Payer: Medicare PPO | Admitting: Podiatry

## 2021-03-10 ENCOUNTER — Other Ambulatory Visit: Payer: Self-pay

## 2021-03-10 DIAGNOSIS — L600 Ingrowing nail: Secondary | ICD-10-CM

## 2021-03-10 MED ORDER — NEOMYCIN-POLYMYXIN-HC 1 % OT SOLN
OTIC | 1 refills | Status: DC
Start: 2021-03-10 — End: 2024-01-22

## 2021-03-10 NOTE — Patient Instructions (Signed)

## 2021-03-10 NOTE — Progress Notes (Signed)
Subjective:  Patient ID: Abigail Shaw, female    DOB: [birth year],  MRN: [id number] HPI Chief Complaint  Patient presents with  . Toe Pain    Hallux right - both borders (medial worse), tender x 1 week, tried trimming  . Teo Pain    2nd toe left - medial border, tender sometimes  . New Patient (Initial Visit)    86 y.o. female presents with the above complaint.   ROS: Denies fever chills nausea vomiting muscle aches pains calf pain back pain chest pain shortness of breath.  Past Medical History:  Diagnosis Date  . Arthritis    joints  . Breast cyst   . Cancer ([place]) 2014   right breast cancer and radiation  . Complication of anesthesia    difficulty waking up-quit breathing during first TDC, bruising on jaw after surgery  . Depression   . GERD (gastroesophageal reflux disease)   . Glaucoma   . Headache    migraines, none for awhile  . History of hiatal hernia   . Hypercholesteremia   . Hypertension    controlled on meds  . IBS (irritable bowel syndrome)   . Iron deficiency anemia   . Joint pain   . Motion sickness    ferry, cars  . Osteoporosis   . Personal history of radiation therapy 2014   Right breast  . Shingles 2012?   right side  . Wears dentures    partial upper (1 tooth)   Past Surgical History:  Procedure Laterality Date  . ABDOMINAL HYSTERECTOMY  1976  . BREAST BIOPSY Right 2014   Invasive Carcinoma  . BREAST LUMPECTOMY Right 2014   + mammo cite rad and aromasin  . BREAST MAMMOSITE Right 08-01-13  . BREAST SURGERY Right 07-02-2013   with sentinal biopsy  . BREAST SURGERY Right 07-18-13  . CARDIAC CATHETERIZATION    . CATARACT EXTRACTION W/ INTRAOCULAR LENS  IMPLANT, BILATERAL    . CATARACT EXTRACTION W/PHACO Left 06/26/2019   Procedure: Neldon Newport DUAL BLADE GONIOTOMY LEFT;  Surgeon: Leandrew Koyanagi, MD;  Location: [place];  Service: Ophthalmology;  Laterality: Left;  . COLONOSCOPY     Dr Candace Cruise  . COLONOSCOPY WITH PROPOFOL N/A 05/20/2020    Procedure: COLONOSCOPY WITH PROPOFOL;  Surgeon: Robert Bellow, MD;  [facility] ENDOSCOPY;  Service: Endoscopy;  Laterality: N/A;  . ESOPHAGOGASTRODUODENOSCOPY (EGD) WITH PROPOFOL N/A 04/04/2018   Procedure: ESOPHAGOGASTRODUODENOSCOPY (EGD) WITH PROPOFOL;  Surgeon: Toledo, Benay Pike, MD;  Location: [facility] ENDOSCOPY;  Service: Gastroenterology;  Laterality: N/A;  . EXCISION VAGINAL CYST    . FOOT SURGERY Left 12/05/14  . HAND SURGERY     trigger finger  . MM BREAST STEREO BX*R*R/S Right 2014  . PHOTOCOAGULATION Left 05/16/2018   Procedure: TRANSCLERAL DIODE CYCLOPHOTOCOAGULATION LEFT IVA BLOCK;  Surgeon: Leandrew Koyanagi, MD;  Location: [place];  Service: Ophthalmology;  Laterality: Left;  IVA BLOCK LEFT  . PHOTOCOAGULATION WITH LASER Left 08/22/2016   Procedure: PHOTOCOAGULATION WITH LASER;  Surgeon: Ronnell Freshwater, MD;  Location: [place];  Service: Ophthalmology;  Laterality: Left;  KEEP PT 2ND    Current Outpatient Medications:  .  NEOMYCIN-POLYMYXIN-HYDROCORTISONE (CORTISPORIN) 1 % SOLN OTIC solution, Apply 1-2 drops to toe BID after soaking, Disp: 10 mL, Rfl: 1 .  acetaZOLAMIDE (DIAMOX) 500 MG capsule, TAKE 1 CAPSULE AS SINGLE DOSE. TAKE THURSDAY MORNING 10/08/20 PRIOR TO YOUR CLINIC VISIT AS DIRECTED., Disp: , Rfl:  .  amLODipine (NORVASC) 2.5 MG tablet, Take 2.5 mg  by mouth daily. am, Disp: , Rfl:  .  Biotin 1000 MCG tablet, Take 1,000 mcg by mouth daily. am, Disp: , Rfl:  .  cetirizine (ZYRTEC) 10 MG tablet, Take 10 mg by mouth every evening. , Disp: , Rfl:  .  clotrimazole-betamethasone (LOTRISONE) lotion, APPLY A SMALL AMOUNT TO AFFECTED AREA OF EAR TWICE DAILY FOR 7 DAYS, THEN 1-2 TIMES WEEKLY AS NEEDED FOR ITCHING, Disp: , Rfl:  .  dorzolamide-timolol (COSOPT) 22.3-6.8 MG/ML ophthalmic solution, Place 1 drop into the left eye 2 (two) times daily., Disp: , Rfl:  .  FLUoxetine (PROZAC) 20 MG capsule, Take 20 mg by mouth daily. am, Disp: ,  Rfl:  .  fluticasone (FLONASE) 50 MCG/ACT nasal spray, Place 2 sprays into both nostrils daily. am, Disp: , Rfl:  .  LORazepam (ATIVAN) 0.5 MG tablet, Take 0.5 mg by mouth at bedtime. And as needed, Disp: , Rfl:  .  losartan (COZAAR) 50 MG tablet, Take 50 mg by mouth every evening. , Disp: , Rfl:  .  Meloxicam 15 MG TBDP, , Disp: , Rfl:  .  pantoprazole (PROTONIX) 40 MG tablet, Take 40 mg by mouth daily. Am and pm , Disp: , Rfl:  .  prednisoLONE acetate (PRED FORTE) 1 % ophthalmic suspension, Place 1 drop into the left eye 2 (two) times daily., Disp: , Rfl:  .  rizatriptan (MAXALT) 10 MG tablet, Take 10 mg by mouth as needed for migraine. May repeat in 2 hours if needed, Disp: , Rfl:  .  rosuvastatin (CRESTOR) 5 MG tablet, Take 5 mg by mouth every other day. , Disp: , Rfl:  .  Travoprost, BAK Free, (TRAVATAN) 0.004 % SOLN ophthalmic solution, Place 1 drop into both eyes at bedtime., Disp: , Rfl:  .  Vitamin D, Ergocalciferol, (DRISDOL) 50000 UNITS CAPS capsule, Take 1 capsule by mouth once a week., Disp: , Rfl:   Allergies  Allergen Reactions  . Lisinopril Cough       . Simbrinza [Brinzolamide-Brimonidine]     lethargy  . Sulfamethoxazole-Trimethoprim     Other reaction(s): Other (See Comments) HYPONATREMIA  . Tape Other (See Comments)    Redness and irritation   Review of Systems Objective:  There were no vitals filed for this visit.  General: Well developed, nourished, in no acute distress, alert and oriented x3   Dermatological: Skin is warm, dry and supple bilateral. Nails x 10 are well maintained; remaining integument appears unremarkable at this time. There are no open sores, no preulcerative lesions, no rash or signs of infection present.  Sharp incurvated nail margin tibiofibular border the hallux right.  No signs of infection.  Vascular: Dorsalis Pedis artery and Posterior Tibial artery pedal pulses are 2/4 bilateral with immedate capillary fill time. Pedal hair growth  present. No varicosities and no lower extremity edema present bilateral.   Neruologic: Grossly intact via light touch bilateral. Vibratory intact via tuning fork bilateral. Protective threshold with Semmes Wienstein monofilament intact to all pedal sites bilateral. Patellar and Achilles deep tendon reflexes 2+ bilateral. No Babinski or clonus noted bilateral.   Musculoskeletal: No gross boney pedal deformities bilateral. No pain, crepitus, or limitation noted with foot and ankle range of motion bilateral. Muscular strength 5/5 in all groups tested bilateral.  Gait: Unassisted, Nonantalgic.    Radiographs:  None taken  Assessment & Plan:   Assessment: Ingrown toenail hallux right both borders  Plan: Chemical matricectomy hallux right after local anesthetic was administered she tolerated procedure well without  complications.  We provided her with both oral and written home-going instructions for the care and soaking of the toe as well as a prescription for Cortisporin Otic to be applied twice daily after soaking.  I will follow-up with her in 2 weeks.     Sarahgrace Broman T. [place], [place]

## 2021-03-31 ENCOUNTER — Ambulatory Visit: Payer: Medicare PPO | Admitting: Podiatry

## 2021-04-19 ENCOUNTER — Ambulatory Visit: Payer: Medicare PPO | Admitting: Podiatry

## 2021-04-19 ENCOUNTER — Other Ambulatory Visit: Payer: Self-pay

## 2021-04-19 ENCOUNTER — Encounter: Payer: Self-pay | Admitting: Podiatry

## 2021-04-19 DIAGNOSIS — Z9889 Other specified postprocedural states: Secondary | ICD-10-CM

## 2021-04-19 DIAGNOSIS — L600 Ingrowing nail: Secondary | ICD-10-CM

## 2021-04-19 NOTE — Progress Notes (Signed)
She presents today states that they are doing better the right toe is still a little bit tender as she refers to the ingrown nails.  Objective: Vital signs are stable she alert and orient x3 there is no erythema edema/drainage odor these appear to be healing very nicely.  Assessment: Well-healing nail avulsions.  Plan: Follow-up with me on an as-needed basis otherwise continue conservative therapies.

## 2021-04-28 ENCOUNTER — Other Ambulatory Visit: Payer: Self-pay | Admitting: Family Medicine

## 2021-04-28 DIAGNOSIS — Z1231 Encounter for screening mammogram for malignant neoplasm of breast: Secondary | ICD-10-CM

## 2021-07-29 ENCOUNTER — Ambulatory Visit
Admission: RE | Admit: 2021-07-29 | Discharge: 2021-07-29 | Disposition: A | Discharge status: Home / Self Care | Payer: Medicare PPO | Source: Ambulatory Visit | Attending: Family Medicine | Admitting: Family Medicine

## 2021-07-29 ENCOUNTER — Other Ambulatory Visit: Payer: Self-pay

## 2021-07-29 DIAGNOSIS — Z1231 Encounter for screening mammogram for malignant neoplasm of breast: Secondary | ICD-10-CM | POA: Insufficient documentation

## 2021-12-16 ENCOUNTER — Other Ambulatory Visit: Payer: Self-pay | Admitting: Family Medicine

## 2021-12-16 DIAGNOSIS — L299 Pruritus, unspecified: Secondary | ICD-10-CM

## 2021-12-16 DIAGNOSIS — R7989 Other specified abnormal findings of blood chemistry: Secondary | ICD-10-CM

## 2021-12-28 ENCOUNTER — Ambulatory Visit
Admission: RE | Admit: 2021-12-28 | Discharge: 2021-12-28 | Disposition: A | Discharge status: Home / Self Care | Payer: Medicare PPO | Source: Ambulatory Visit | Attending: Family Medicine | Admitting: Family Medicine

## 2021-12-28 DIAGNOSIS — L299 Pruritus, unspecified: Secondary | ICD-10-CM | POA: Diagnosis not present

## 2021-12-28 DIAGNOSIS — R7989 Other specified abnormal findings of blood chemistry: Secondary | ICD-10-CM | POA: Insufficient documentation

## 2022-04-20 ENCOUNTER — Other Ambulatory Visit: Payer: Self-pay | Admitting: Student

## 2022-04-20 DIAGNOSIS — I208 Other forms of angina pectoris: Secondary | ICD-10-CM

## 2022-04-20 DIAGNOSIS — R079 Chest pain, unspecified: Secondary | ICD-10-CM

## 2022-04-20 DIAGNOSIS — I2089 Other forms of angina pectoris: Secondary | ICD-10-CM

## 2022-04-22 ENCOUNTER — Telehealth (HOSPITAL_COMMUNITY): Payer: Self-pay | Admitting: Emergency Medicine

## 2022-04-22 NOTE — Telephone Encounter (Signed)
Attempted to call patient regarding upcoming cardiac CT appointment. °Left message on voicemail with name and callback number °Charlet Harr RN Navigator Cardiac Imaging °[facility] °[phone] Office °[phone] Cell ° °

## 2022-04-25 ENCOUNTER — Ambulatory Visit
Admission: RE | Admit: 2022-04-25 | Discharge: 2022-04-25 | Disposition: A | Discharge status: Home / Self Care | Payer: Medicare PPO | Source: Ambulatory Visit | Attending: Student | Admitting: Student

## 2022-04-25 ENCOUNTER — Other Ambulatory Visit: Payer: Self-pay

## 2022-04-25 DIAGNOSIS — I208 Other forms of angina pectoris: Secondary | ICD-10-CM | POA: Insufficient documentation

## 2022-04-25 DIAGNOSIS — R079 Chest pain, unspecified: Secondary | ICD-10-CM | POA: Diagnosis not present

## 2022-04-25 MED ORDER — IOHEXOL 350 MG/ML SOLN
75.0000 mL | Freq: Once | INTRAVENOUS | Status: AC | PRN
  Administered 2022-04-25: 75 mL via INTRAVENOUS

## 2022-04-25 MED ORDER — NITROGLYCERIN 0.4 MG SL SUBL
0.8000 mg | SUBLINGUAL_TABLET | Freq: Once | SUBLINGUAL | Status: AC
Start: 2022-04-25 — End: 2022-04-25
  Administered 2022-04-25: 0.8 mg via SUBLINGUAL

## 2022-04-25 NOTE — Progress Notes (Signed)
Patient tolerated procedure well. Ambulate w/o difficulty. Denies light headedness or being dizzy. Sitting in chair drinking water provided. Encouraged to drink extra water today and reasoning explained. Verbalized understanding. All questions answered. ABC intact. No further needs. Discharge from procedure area w/o issues.   °

## 2022-06-29 ENCOUNTER — Other Ambulatory Visit: Payer: Self-pay | Admitting: Family Medicine

## 2022-06-29 DIAGNOSIS — Z1231 Encounter for screening mammogram for malignant neoplasm of breast: Secondary | ICD-10-CM

## 2022-08-01 ENCOUNTER — Ambulatory Visit
Admission: RE | Admit: 2022-08-01 | Discharge: 2022-08-01 | Disposition: A | Discharge status: Home / Self Care | Payer: Medicare PPO | Source: Ambulatory Visit | Attending: Family Medicine | Admitting: Family Medicine

## 2022-08-01 DIAGNOSIS — Z1231 Encounter for screening mammogram for malignant neoplasm of breast: Secondary | ICD-10-CM | POA: Diagnosis present

## 2022-09-04 ENCOUNTER — Emergency Department
Admission: EM | Admit: 2022-09-04 | Discharge: 2022-09-04 | Disposition: A | Discharge status: Home / Self Care | Payer: Medicare PPO | Attending: Emergency Medicine | Admitting: Emergency Medicine

## 2022-09-04 ENCOUNTER — Emergency Department: Payer: Medicare PPO

## 2022-09-04 DIAGNOSIS — R531 Weakness: Secondary | ICD-10-CM | POA: Diagnosis present

## 2022-09-04 DIAGNOSIS — I10 Essential (primary) hypertension: Secondary | ICD-10-CM | POA: Insufficient documentation

## 2022-09-04 DIAGNOSIS — R001 Bradycardia, unspecified: Secondary | ICD-10-CM | POA: Diagnosis not present

## 2022-09-04 DIAGNOSIS — Z20822 Contact with and (suspected) exposure to covid-19: Secondary | ICD-10-CM | POA: Insufficient documentation

## 2022-09-04 LAB — CBC WITH DIFFERENTIAL/PLATELET
Abs Immature Granulocytes: 0.01 10*3/uL (ref 0.00–0.07)
Basophils Absolute: 0 10*3/uL (ref 0.0–0.1)
Basophils Relative: 1 %
Eosinophils Absolute: 0.1 10*3/uL (ref 0.0–0.5)
Eosinophils Relative: 1 %
HCT: 37.3 % (ref 36.0–46.0)
Hemoglobin: 12.1 g/dL (ref 12.0–15.0)
Immature Granulocytes: 0 %
Lymphocytes Relative: 31 %
Lymphs Abs: 1.6 10*3/uL (ref 0.7–4.0)
MCH: 28.1 pg (ref 26.0–34.0)
MCHC: 32.4 g/dL (ref 30.0–36.0)
MCV: 86.7 fL (ref 80.0–100.0)
Monocytes Absolute: 0.5 10*3/uL (ref 0.1–1.0)
Monocytes Relative: 9 %
Neutro Abs: 3 10*3/uL (ref 1.7–7.7)
Neutrophils Relative %: 58 %
Platelets: 215 10*3/uL (ref 150–400)
RBC: 4.3 MIL/uL (ref 3.87–5.11)
RDW: 14 % (ref 11.5–15.5)
WBC: 5.2 10*3/uL (ref 4.0–10.5)
nRBC: 0 % (ref 0.0–0.2)

## 2022-09-04 LAB — TSH: TSH: 2.253 u[IU]/mL (ref 0.350–4.500)

## 2022-09-04 LAB — URINALYSIS, ROUTINE W REFLEX MICROSCOPIC
Bilirubin Urine: NEGATIVE
Glucose, UA: NEGATIVE mg/dL
Hgb urine dipstick: NEGATIVE
Ketones, ur: NEGATIVE mg/dL
Leukocytes,Ua: NEGATIVE
Nitrite: NEGATIVE
Protein, ur: NEGATIVE mg/dL
Specific Gravity, Urine: 1.004 — ABNORMAL LOW (ref 1.005–1.030)
pH: 7 (ref 5.0–8.0)

## 2022-09-04 LAB — COMPREHENSIVE METABOLIC PANEL
ALT: 18 U/L (ref 0–44)
AST: 21 U/L (ref 15–41)
Albumin: 3.4 g/dL — ABNORMAL LOW (ref 3.5–5.0)
Alkaline Phosphatase: 85 U/L (ref 38–126)
Anion gap: 5 (ref 5–15)
BUN: 13 mg/dL (ref 8–23)
CO2: 22 mmol/L (ref 22–32)
Calcium: 9 mg/dL (ref 8.9–10.3)
Chloride: 106 mmol/L (ref 98–111)
Creatinine, Ser: 0.69 mg/dL (ref 0.44–1.00)
GFR, Estimated: >60 mL/min (ref >60)
Glucose, Bld: 122 mg/dL — ABNORMAL HIGH (ref 70–99)
Potassium: 3.4 mmol/L — ABNORMAL LOW (ref 3.5–5.1)
Sodium: 133 mmol/L — ABNORMAL LOW (ref 135–145)
Total Bilirubin: 0.2 mg/dL — ABNORMAL LOW (ref 0.3–1.2)
Total Protein: 6 g/dL — ABNORMAL LOW (ref 6.5–8.1)

## 2022-09-04 LAB — TROPONIN I (HIGH SENSITIVITY)
Troponin I (High Sensitivity): 4 ng/L (ref <18)
Troponin I (High Sensitivity): 5 ng/L (ref <18)

## 2022-09-04 LAB — RESP PANEL BY RT-PCR (FLU A&B, COVID) ARPGX2
Influenza A by PCR: NEGATIVE
Influenza B by PCR: NEGATIVE
SARS Coronavirus 2 by RT PCR: NEGATIVE

## 2022-09-04 MED ORDER — SODIUM CHLORIDE 0.9 % IV BOLUS
500.0000 mL | Freq: Once | INTRAVENOUS | Status: AC
  Administered 2022-09-04: 500 mL via INTRAVENOUS

## 2022-09-04 NOTE — Discharge Instructions (Signed)
Continue taking all of your normal medications as prescribed.  Make sure you are staying well-hydrated.  Follow-up with the cardiologist as scheduled.  Return to the ER immediately for new, worsening, or persistent severe generalized weakness, any numbness or weakness that is more on one side of the body than the other, lightheadedness or passing out, chest pain, difficulty breathing, severe headache, fever, heart rate below 40, or any other new or worsening symptoms that concern you.

## 2022-09-04 NOTE — ED Provider Notes (Signed)
[facility] Provider Note    Event Date/Time   First MD Initiated Contact with Patient 09/04/22 1521     (approximate)   History   Weakness   HPI  Abigail Shaw is a 87 y.o. female with a history of hypertension, bradycardia, hypercholesterolemia, anemia, osteoporosis, and GERD who presents with generalized weakness over the last few weeks, but acutely worsened this afternoon.  The patient describes her symptoms as a generalized feeling of heaviness in her body.  She feels it somewhat more on the left side but does not endorse any weakness or numbness in the left side.  She has no headache, nausea or vomiting, difficulty breathing, chest pain, abdominal pain, or urinary symptoms.  She and her husband report that she had a similar episode in June which was less severe but that resolved spontaneously.  She did not seek medical care for it.   Physical Exam   Triage Vital Signs: ED Triage Vitals  Enc Vitals Group     BP 09/04/22 1535 (!) 171/78     Pulse Rate 09/04/22 1535 (!) 51     Resp 09/04/22 1535 12     Temp 09/04/22 1535 98.1 F (36.7 C)     Temp Source 09/04/22 1535 Oral     SpO2 09/04/22 1535 100 %     Weight 09/04/22 1536 155 lb (70.3 kg)     Height 09/04/22 1536 '5\' 3"'$  (1.6 m)     Head Circumference --      Peak Flow --      Pain Score 09/04/22 1536 0     Pain Loc --      Pain Edu? --      Excl. in [place]? --     Most recent vital signs: Vitals:   09/04/22 1921 09/04/22 2030  BP: (!) 153/85 (!) 152/71  Pulse: (!) 45 (!) 43  Resp: 16 11  Temp: 97.7 F (36.5 C)   SpO2: 99% 96%     General: Alert and oriented, comfortable appearing. CV:  Good peripheral perfusion.  Resp:  Normal effort.  Abd:  No distention.  Other:  EOMI.  PERRLA.  No facial droop.  Cranial nerves III through XII intact.  5/5 motor strength and intact sensation to bilateral upper and lower extremities.  No pronator drift.  No ataxia on finger-to-nose.   ED  Results / Procedures / Treatments   Labs (all labs ordered are listed, but only abnormal results are displayed) Labs Reviewed  COMPREHENSIVE METABOLIC PANEL - Abnormal; Notable for the following components:      Result Value   Sodium 133 (*)    Potassium 3.4 (*)    Glucose, Bld 122 (*)    Total Protein 6.0 (*)    Albumin 3.4 (*)    Total Bilirubin 0.2 (*)    All other components within normal limits  URINALYSIS, ROUTINE W REFLEX MICROSCOPIC - Abnormal; Notable for the following components:   Color, Urine STRAW (*)    APPearance CLEAR (*)    Specific Gravity, Urine 1.004 (*)    All other components within normal limits  RESP PANEL BY RT-PCR (FLU A&B, COVID) ARPGX2  CBC WITH DIFFERENTIAL/PLATELET  TSH  TROPONIN I (HIGH SENSITIVITY)  TROPONIN I (HIGH SENSITIVITY)     EKG  ED ECG REPORT I, Arta Silence, the attending physician, personally viewed and interpreted this ECG.  Date: 09/04/2022 EKG Time: 1539 Rate: 50 Rhythm: normal sinus rhythm QRS Axis: normal Intervals: Prolonged  PR ST/T Wave abnormalities: normal Narrative Interpretation: no evidence of acute ischemia    RADIOLOGY  CT head: I independently viewed and interpreted the images; there is no ICH.  Radiology report indicates no acute abnormalities.  PROCEDURES:  Critical Care performed: No  Procedures   MEDICATIONS ORDERED IN ED: Medications  sodium chloride 0.9 % bolus 500 mL (0 mLs Intravenous Stopped 09/04/22 1732)     IMPRESSION / MDM / ASSESSMENT AND PLAN / ED COURSE  I reviewed the triage vital signs and the nursing notes.  87 year old female with PMH as noted above presents with acute onset of generalized weakness and a feeling of heaviness in her body that started today although there is no specific clear time of onset.  I reviewed the past medical records.  The patient was seen by cardiology at [facility] on 8/28 and reported generally feeling unwell at that time, as well as off balance.   She was found to be bradycardic to the low 50s and was referred to EP for possible pacemaker placement.  Symptoms are also attributed to side effects of Wellbutrin, which was discontinued.  Currently on exam the vital signs are normal except for heart rate around 50 which is consistent with what she has had recently.  Neurologic exam is nonfocal.  Differential diagnosis includes, but is not limited to, symptomatic bradycardia, dehydration, electrolyte abnormality, other metabolic disturbance, ACS or other cardiac cause, medication side effect, UTI or other infection.  There are no findings to suggest acute CVA.  Patient's presentation is most consistent with acute presentation with potential threat to life or bodily function.  We will obtain lab work-up, CT head, give a fluid bolus, and reassess.  The patient is on the cardiac monitor to evaluate for evidence of arrhythmia and/or significant heart rate changes.  ----------------------------------------- 8:39 PM on 09/04/2022 -----------------------------------------  The patient's work-up is entirely within normal limits except for slightly low albumin and total protein.  Electrolytes are normal.  There is no leukocytosis or anemia.  Urinalysis shows no acute findings.  Troponins are negative x2.  TSH is normal.  We added on a COVID swab which is also negative.  The patient's heart rate has remained stable in the 40s with a normal to high blood pressure.  She was able to get up and ambulate.  She is still weak, but states that she now feels close to what has been her baseline over the last several weeks.  She no longer feels as weak as she did earlier today that prompted her to come into the hospital.  There continue to be no unilateral symptoms and no evidence of acute stroke.  I had an extensive discussion with the patient and her husband.  At this time there is no evidence of a specific reversible cause of her weakness today.  It may be  related to her bradycardia although her heart rate the same as it has been for at least the last several weeks.  It could also be dehydration as she is feeling better after fluids.  I did consider whether the patient would benefit from admission for cardiac monitoring, however she is currently in the midst of outpatient work-up and treatment for this and is planned for pacemaker.  I did offer the patient to be admitted for observation overnight.  However, she states that she feels significantly better than she did earlier, close to her recent baseline, and states that she would strongly prefer to go home.  Given the reassuring work-up  and stable vital signs over more than 5 hours in the ED, I feel that this is reasonable.  I gave the patient strict return precautions and she expressed understanding.  She already has follow-up arranged with the EP this week.    FINAL CLINICAL IMPRESSION(S) / ED DIAGNOSES   Final diagnoses:  Bradycardia  Generalized weakness     Rx / DC Orders   ED Discharge Orders     None        Note:  This document was prepared using Dragon voice recognition software and may include unintentional dictation errors.    Arta Silence, MD 09/04/22 2042

## 2022-09-04 NOTE — ED Triage Notes (Signed)
Pt BIB [facility] from [facility] independent living c/o increased weakness since this morning. Pt reports generalized weakness progressively worsening over the last several months, but today it has gotten so bad that she could not sit up by herself.

## 2023-03-31 ENCOUNTER — Encounter: Payer: Self-pay | Admitting: Family Medicine

## 2023-04-03 ENCOUNTER — Other Ambulatory Visit: Payer: Self-pay | Admitting: Family Medicine

## 2023-04-03 DIAGNOSIS — N6459 Other signs and symptoms in breast: Secondary | ICD-10-CM

## 2023-04-03 DIAGNOSIS — Z853 Personal history of malignant neoplasm of breast: Secondary | ICD-10-CM

## 2023-04-04 ENCOUNTER — Ambulatory Visit
Admission: RE | Admit: 2023-04-04 | Discharge: 2023-04-04 | Disposition: A | Discharge status: Home / Self Care | Payer: Medicare PPO | Source: Ambulatory Visit | Attending: Family Medicine | Admitting: Family Medicine

## 2023-04-04 DIAGNOSIS — N6459 Other signs and symptoms in breast: Secondary | ICD-10-CM | POA: Diagnosis present

## 2023-04-04 DIAGNOSIS — Z853 Personal history of malignant neoplasm of breast: Secondary | ICD-10-CM | POA: Diagnosis present

## 2023-07-05 ENCOUNTER — Other Ambulatory Visit: Payer: Self-pay | Admitting: Family Medicine

## 2023-07-05 DIAGNOSIS — Z1231 Encounter for screening mammogram for malignant neoplasm of breast: Secondary | ICD-10-CM

## 2023-08-03 ENCOUNTER — Ambulatory Visit
Admission: RE | Admit: 2023-08-03 | Discharge: 2023-08-03 | Disposition: A | Discharge status: Home / Self Care | Payer: Medicare PPO | Source: Ambulatory Visit | Attending: Family Medicine | Admitting: Family Medicine

## 2023-08-03 DIAGNOSIS — Z1231 Encounter for screening mammogram for malignant neoplasm of breast: Secondary | ICD-10-CM | POA: Insufficient documentation

## 2023-08-29 ENCOUNTER — Ambulatory Visit
Admission: EM | Admit: 2023-08-29 | Discharge: 2023-08-29 | Disposition: A | Discharge status: Home / Self Care | Payer: Medicare PPO | Attending: Emergency Medicine | Admitting: Emergency Medicine

## 2023-08-29 DIAGNOSIS — J069 Acute upper respiratory infection, unspecified: Secondary | ICD-10-CM | POA: Diagnosis present

## 2023-08-29 DIAGNOSIS — B9789 Other viral agents as the cause of diseases classified elsewhere: Secondary | ICD-10-CM | POA: Diagnosis not present

## 2023-08-29 DIAGNOSIS — Z1152 Encounter for screening for COVID-19: Secondary | ICD-10-CM | POA: Insufficient documentation

## 2023-08-29 DIAGNOSIS — R350 Frequency of micturition: Secondary | ICD-10-CM | POA: Diagnosis present

## 2023-08-29 DIAGNOSIS — R52 Pain, unspecified: Secondary | ICD-10-CM | POA: Insufficient documentation

## 2023-08-29 LAB — POCT URINALYSIS DIP (MANUAL ENTRY)
Bilirubin, UA: NEGATIVE
Glucose, UA: NEGATIVE mg/dL
Ketones, POC UA: NEGATIVE mg/dL
Leukocytes, UA: NEGATIVE
Nitrite, UA: NEGATIVE
Protein Ur, POC: NEGATIVE mg/dL
Spec Grav, UA: 1.02 (ref 1.010–1.025)
Urobilinogen, UA: 0.2 U/dL
pH, UA: 7.5 (ref 5.0–8.0)

## 2023-08-29 MED ORDER — BENZONATATE 100 MG PO CAPS: 100.0000 mg | ORAL_CAPSULE | Freq: Three times a day (TID) | ORAL | 0 refills | Status: DC | PRN

## 2023-08-29 NOTE — Discharge Instructions (Addendum)
Your urine does not show infection.    Your COVID test is pending.    Take Tylenol as needed for fever or discomfort.  Rest and keep yourself hydrated.    Follow-up with your primary care provider if your symptoms are not improving.

## 2023-08-29 NOTE — ED Provider Notes (Signed)
Renaldo Fiddler    CSN: [id number] Arrival date & time: 08/29/23  1055      History   Chief Complaint Chief Complaint  Patient presents with   Cough   Generalized Body Aches    HPI Abigail Shaw is a 88 y.o. female.  Accompanied by her husband, patient presents with 2-day history of bodyaches, chills, fatigue, cough.  Patient also presents with urinary frequency.  She believes this may be due to coughing but would like to be checked for UTI.  No fever, chest pain, shortness of breath, abdominal pain, dysuria, hematuria, vomiting, diarrhea, or other symptoms.  No OTC medications taken.  Her medical history includes breast cancer, anemia, arthritis, glaucoma, hyperlipidemia, IBS, GERD.  The history is provided by the patient, the spouse and medical records.    Past Medical History:  Diagnosis Date   Arthritis    joints   Breast cyst    Cancer (HCC) 2014   right breast cancer and radiation   Complication of anesthesia    difficulty waking up-quit breathing during first TDC, bruising on jaw after surgery   Depression    GERD (gastroesophageal reflux disease)    Glaucoma    Headache    migraines, none for awhile   History of hiatal hernia    Hypercholesteremia    Hypertension    controlled on meds   IBS (irritable bowel syndrome)    Iron deficiency anemia    Joint pain    Motion sickness    ferry, cars   Osteoporosis    Personal history of radiation therapy 2014   Right breast   Shingles 2012?   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 ER/PR pos, Her 2 neg 06/26/2013    Past Surgical History:  Procedure Laterality Date   ABDOMINAL HYSTERECTOMY  1976   BREAST BIOPSY Right 2014   Invasive Carcinoma   BREAST LUMPECTOMY Right 2014   + mammo cite rad and aromasin   BREAST MAMMOSITE Right 08-01-13   BREAST SURGERY Right 07-02-2013   with sentinal biopsy   BREAST SURGERY Right 07-18-13   CARDIAC CATHETERIZATION     CATARACT EXTRACTION W/ INTRAOCULAR LENS  IMPLANT, BILATERAL     CATARACT EXTRACTION W/PHACO Left 06/26/2019   Procedure: Milon Dikes DUAL BLADE GONIOTOMY LEFT;  Surgeon: Lockie Mola, MD;  Location: [facility];  Service: Ophthalmology;  Laterality: Left;   COLONOSCOPY     Dr Bluford Kaufmann   COLONOSCOPY WITH PROPOFOL N/A 05/20/2020   Procedure: COLONOSCOPY WITH PROPOFOL;  Surgeon: Earline Mayotte, MD;  Location: [facility];  Service: Endoscopy;  Laterality: N/A;   ESOPHAGOGASTRODUODENOSCOPY (EGD) WITH PROPOFOL N/A 04/04/2018   Procedure: ESOPHAGOGASTRODUODENOSCOPY (EGD) WITH PROPOFOL;  Surgeon: Toledo, Boykin Nearing, MD;  Location: [facility] ENDOSCOPY;  Service: Gastroenterology;  Laterality: N/A;   EXCISION VAGINAL CYST     FOOT SURGERY Left 12/05/14   HAND SURGERY     trigger finger   MM BREAST STEREO BX*R*R/S Right  2014   PHOTOCOAGULATION Left 05/16/2018   Procedure: TRANSCLERAL DIODE CYCLOPHOTOCOAGULATION LEFT IVA BLOCK;  Surgeon: Lockie Mola, MD;  Location: [facility];  Service: Ophthalmology;  Laterality: Left;  IVA BLOCK LEFT   PHOTOCOAGULATION WITH LASER Left 08/22/2016   Procedure: PHOTOCOAGULATION WITH LASER;  Surgeon: Sherald Hess, MD;  Location: [facility];  Service: Ophthalmology;  Laterality: Left;  KEEP PT 2ND    OB History     Gravida  6   Para  5   Term      Preterm      AB  1   Living  5      SAB      IAB      Ectopic      Multiple      Live Births            Obstetric Comments  1st Menstrual Cycle:  15 1st Pregnancy:  18          Home Medications    Prior to Admission medications   Medication Sig Start Date End Date Taking? Authorizing Provider  benzonatate (TESSALON) 100 MG capsule Take 1 capsule (100 mg total) by mouth 3 (three) times daily as needed for cough. 08/29/23  Yes Mickie Bail, NP  acetaZOLAMIDE (DIAMOX) 500 MG capsule TAKE 1 CAPSULE AS SINGLE DOSE. TAKE THURSDAY MORNING 10/08/20 PRIOR TO YOUR CLINIC VISIT AS DIRECTED. Patient not taking: Reported on 08/29/2023 10/07/20   [provider]  amLODipine (NORVASC) 2.5 MG tablet Take 2.5 mg by mouth daily. am    [provider]  Biotin 1000 MCG tablet Take 1,000 mcg by mouth daily. am    [provider]  cetirizine (ZYRTEC) 10 MG tablet Take 10 mg by mouth every evening.     [provider]  clotrimazole-betamethasone (LOTRISONE) lotion APPLY A SMALL AMOUNT TO AFFECTED AREA OF EAR TWICE DAILY FOR 7 DAYS, THEN 1-2 TIMES WEEKLY AS NEEDED FOR ITCHING 09/17/20   [provider]  dorzolamide-timolol (COSOPT) 22.3-6.8 MG/ML ophthalmic solution Place 1 drop into the left eye 2 (two) times daily. 02/25/20   [provider]  FLUoxetine (PROZAC) 20 MG capsule Take 20 mg by mouth daily. am    [provider]  fluticasone (FLONASE) 50 MCG/ACT nasal spray Place 2 sprays into both nostrils daily. am 07/15/13   [provider]  LORazepam (ATIVAN) 0.5 MG tablet Take 0.5 mg by mouth at bedtime. And as needed    [provider]  losartan (COZAAR) 50 MG tablet Take 50 mg by mouth every evening.     [provider]  Meloxicam 15 MG TBDP  08/29/19   [provider]  NEOMYCIN-POLYMYXIN-HYDROCORTISONE (CORTISPORIN) 1 % SOLN OTIC solution Apply 1-2 drops to toe BID after soaking 03/10/21   Hyatt, Max T, DPM  pantoprazole (PROTONIX) 40 MG tablet Take 40 mg by mouth daily. Am and pm     [provider]   prednisoLONE acetate (PRED FORTE) 1 % ophthalmic suspension Place 1 drop into the left eye 2 (two) times daily. Patient not taking: Reported on 08/29/2023 02/27/20   [provider]  rizatriptan (MAXALT) 10 MG tablet Take 10 mg by mouth as needed for migraine. May repeat in 2 hours if needed    [provider]  rosuvastatin (CRESTOR) 5 MG tablet Take 5 mg by mouth every other day.     [provider]  Travoprost, BAK Free, (TRAVATAN) 0.004 % SOLN ophthalmic solution Place  1 drop into both eyes at bedtime.    [provider]  Vitamin D, Ergocalciferol, (DRISDOL) 50000 UNITS CAPS capsule Take 1 capsule by mouth once a week. 06/14/14   [provider]  XARELTO 20 MG TABS tablet Take 20 mg by mouth every morning.    [provider]    Family History Family History  Problem Relation Age of Onset   Cancer Mother        age 21 breast   Breast cancer Mother 70   Cancer Brother        prostate/lung/ liver /spleen   Breast cancer Maternal Aunt        age 64   Colon cancer Paternal Aunt     Social History Social History   Tobacco Use   Smoking status: Never   Smokeless tobacco: Never  Vaping Use   Vaping status: Never Used  Substance Use Topics   Alcohol use: No   Drug use: No     Allergies   Lisinopril, Simbrinza [brinzolamide-brimonidine], Sulfamethoxazole-trimethoprim, and Tape   Review of Systems Review of Systems  Constitutional:  Positive for chills. Negative for fever.  HENT:  Positive for rhinorrhea. Negative for ear pain and sore throat.   Respiratory:  Positive for cough. Negative for shortness of breath.   Cardiovascular:  Negative for chest pain and palpitations.  Gastrointestinal:  Negative for abdominal pain, diarrhea and vomiting.  Genitourinary:  Positive for frequency. Negative for dysuria and hematuria.     Physical Exam Triage Vital Signs ED Triage Vitals  Encounter Vitals Group     BP 08/29/23 1130  137/77     Systolic BP Percentile --      Diastolic BP Percentile --      Pulse Rate 08/29/23 1130 (!) 109     Resp 08/29/23 1126 20     Temp 08/29/23 1126 99 F (37.2 C)     Temp src --      SpO2 08/29/23 1126 97 %     Weight 08/29/23 1128 158 lb (71.7 kg)     Height 08/29/23 1128 5\' 3"  (1.6 m)     Head Circumference --      Peak Flow --      Pain Score 08/29/23 1117 6     Pain Loc --      Pain Education --      Exclude from Growth Chart --    No data found.  Updated Vital Signs BP 137/77   Pulse (!) 109   Temp 99 F (37.2 C)   Resp 20   Ht 5\' 3"  (1.6 m)   Wt 158 lb (71.7 kg)   SpO2 97%   BMI 27.99 kg/m   Visual Acuity Right Eye Distance:   Left Eye Distance:   Bilateral Distance:    Right Eye Near:   Left Eye Near:    Bilateral Near:     Physical Exam Vitals and nursing note reviewed.  Constitutional:      General: She is not in acute distress.    Appearance: She is well-developed.  HENT:     Right Ear: Tympanic membrane normal.     Left Ear: Tympanic membrane normal.     Nose: Rhinorrhea present.     Mouth/Throat:     Mouth: Mucous membranes are moist.     Pharynx: Oropharynx is clear.  Cardiovascular:     Rate and Rhythm: Normal rate and regular rhythm.     Heart sounds: Normal  heart sounds.  Pulmonary:     Effort: Pulmonary effort is normal. No respiratory distress.     Breath sounds: Normal breath sounds.  Abdominal:     General: Bowel sounds are normal.     Palpations: Abdomen is soft.     Tenderness: There is no abdominal tenderness. There is no right CVA tenderness, left CVA tenderness, guarding or rebound.  Musculoskeletal:     Cervical back: Neck supple.  Skin:    General: Skin is warm and dry.  Neurological:     Mental Status: She is alert.  Psychiatric:        Mood and Affect: Mood normal.        Behavior: Behavior normal.      UC Treatments / Results  Labs (all labs ordered are listed, but only abnormal results are  displayed) Labs Reviewed  POCT URINALYSIS DIP (MANUAL ENTRY) - Abnormal; Notable for the following components:      Result Value   Blood, UA trace-lysed (*)    All other components within normal limits  SARS CORONAVIRUS 2 (TAT 6-24 HRS)    EKG   Radiology No results found.  Procedures Procedures (including critical care time)  Medications Ordered in UC Medications - No data to display  Initial Impression / Assessment and Plan / UC Course  I have reviewed the triage vital signs and the nursing notes.  Pertinent labs & imaging results that were available during my care of the patient were reviewed by me and considered in my medical decision making (see chart for details).   Viral URI, urinary frequency.  Not show infection.  COVID pending.  If COVID test is positive, recommend treatment with molnupiravir.  Patient is on Xarelto.  Discussed symptomatic treatment including Tylenol, rest, hydration.  Treating her cough with Tessalon Perles.  Instructed her to follow-up with her PCP if she is not improving.  ED precautions discussed.  Education provided on viral respiratory infection and urinary frequency.  Patient agrees to plan of care.  Final Clinical Impressions(s) / UC Diagnoses   Final diagnoses:  Viral URI  Urinary frequency     Discharge Instructions      Your urine does not show infection.    Your COVID test is pending.    Take Tylenol as needed for fever or discomfort.  Rest and keep yourself hydrated.    Follow-up with your primary care provider if your symptoms are not improving.         ED Prescriptions     Medication Sig Dispense Auth. Provider   benzonatate (TESSALON) 100 MG capsule Take 1 capsule (100 mg total) by mouth 3 (three) times daily as needed for cough. 21 capsule Mickie Bail, NP      PDMP not reviewed this encounter.   Mickie Bail, NP 08/29/23 [id number]

## 2023-08-29 NOTE — ED Triage Notes (Signed)
Patient to Urgent Care with complaints of cough/ fatigue/ generalized body aches.  Reports symptoms started Sunday. Describes cough as productive w/ green mucus. Denies any known fevers; endorses chills.   No otc medications.

## 2023-08-30 LAB — SARS CORONAVIRUS 2 (TAT 6-24 HRS): SARS Coronavirus 2: NEGATIVE

## 2023-11-14 ENCOUNTER — Telehealth: Payer: Self-pay

## 2023-11-14 NOTE — Telephone Encounter (Signed)
Spoke with the patient was not ready to make an appt at this time asked if we could call back first of the year

## 2023-11-20 ENCOUNTER — Other Ambulatory Visit: Payer: Self-pay | Admitting: Internal Medicine

## 2023-11-20 ENCOUNTER — Telehealth (HOSPITAL_COMMUNITY): Payer: Self-pay | Admitting: *Deleted

## 2023-11-20 DIAGNOSIS — I2510 Atherosclerotic heart disease of native coronary artery without angina pectoris: Secondary | ICD-10-CM

## 2023-11-20 DIAGNOSIS — I480 Paroxysmal atrial fibrillation: Secondary | ICD-10-CM

## 2023-11-20 NOTE — Telephone Encounter (Signed)
Calling patient about her upcoming cardiac CT scan. Per the patient's chart, the patient has been in persistent Afib since October. Patient was asked about this and also endorses this. Informed her we will cancel her cardiac CT as this is not an appropriate test for her due to her Afib. Patient verbalized understanding and Dr. Glennis Brink office called and made aware.  Larey Brick RN Navigator Cardiac Imaging [facility] [phone] Office [phone] Cell

## 2023-11-22 ENCOUNTER — Ambulatory Visit: Admission: RE | Admit: 2023-11-22 | Payer: Medicare PPO | Source: Ambulatory Visit

## 2023-12-08 ENCOUNTER — Ambulatory Visit: Payer: Medicare PPO | Admitting: General Practice

## 2023-12-08 ENCOUNTER — Ambulatory Visit
Admission: RE | Admit: 2023-12-08 | Discharge: 2023-12-08 | Disposition: A | Discharge status: Home / Self Care | Payer: Medicare PPO | Attending: Internal Medicine | Admitting: Internal Medicine

## 2023-12-08 ENCOUNTER — Encounter
Admission: RE | Disposition: A | Discharge status: Home / Self Care | Payer: Self-pay | Source: Home / Self Care | Attending: Internal Medicine

## 2023-12-08 ENCOUNTER — Other Ambulatory Visit: Payer: Self-pay

## 2023-12-08 DIAGNOSIS — D649 Anemia, unspecified: Secondary | ICD-10-CM | POA: Diagnosis not present

## 2023-12-08 DIAGNOSIS — I493 Ventricular premature depolarization: Secondary | ICD-10-CM | POA: Insufficient documentation

## 2023-12-08 DIAGNOSIS — F32A Depression, unspecified: Secondary | ICD-10-CM | POA: Diagnosis not present

## 2023-12-08 DIAGNOSIS — I4819 Other persistent atrial fibrillation: Secondary | ICD-10-CM

## 2023-12-08 DIAGNOSIS — I447 Left bundle-branch block, unspecified: Secondary | ICD-10-CM | POA: Diagnosis not present

## 2023-12-08 DIAGNOSIS — I4891 Unspecified atrial fibrillation: Secondary | ICD-10-CM | POA: Insufficient documentation

## 2023-12-08 DIAGNOSIS — K449 Diaphragmatic hernia without obstruction or gangrene: Secondary | ICD-10-CM | POA: Insufficient documentation

## 2023-12-08 DIAGNOSIS — I10 Essential (primary) hypertension: Secondary | ICD-10-CM | POA: Insufficient documentation

## 2023-12-08 DIAGNOSIS — K219 Gastro-esophageal reflux disease without esophagitis: Secondary | ICD-10-CM | POA: Diagnosis not present

## 2023-12-08 DIAGNOSIS — I4719 Other supraventricular tachycardia: Secondary | ICD-10-CM | POA: Insufficient documentation

## 2023-12-08 HISTORY — PX: CARDIOVERSION: SHX1299

## 2023-12-08 SURGERY — CARDIOVERSION
Anesthesia: General

## 2023-12-08 MED ORDER — SODIUM CHLORIDE 0.9 % IV SOLN: INTRAVENOUS | Status: DC

## 2023-12-08 MED ORDER — PROPOFOL 10 MG/ML IV BOLUS
INTRAVENOUS | Status: DC | PRN
  Administered 2023-12-08: 50 mg via INTRAVENOUS

## 2023-12-08 NOTE — CV Procedure (Signed)
Electrical Cardioversion Procedure Note   Procedure: Electrical Cardioversion Indications:  Atrial Tachycardia/atrial fibrillation  Procedure Details Consent: Risks of procedure as well as the alternatives and risks of each were explained to the (patient/caregiver).  Consent for procedure obtained. Time Out: Verified patient identification, verified procedure, site/side was marked, verified correct patient position, special equipment/implants available, medications/allergies/relevent history reviewed, required imaging and test results available.  Performed  Patient placed on cardiac monitor, pulse oximetry, supplemental oxygen as necessary.  Sedation given:  Propofol as per anesthesia Pacer pads placed anterior and posterior chest.  Cardioverted 1 time(s).  Cardioverted at 120J.  Evaluation Findings: Post procedure EKG shows: NSR atrially paced rate of 60 Complications: None Patient did tolerate procedure well.   Dorothyann Peng MD Cardiology 12/08/2023

## 2023-12-08 NOTE — Anesthesia Postprocedure Evaluation (Signed)
Anesthesia Post Note  Patient: Abigail Shaw  Procedure(s) Performed: CARDIOVERSION  Patient location during evaluation: Specials Recovery Anesthesia Type: General Level of consciousness: awake and alert Pain management: pain level controlled Vital Signs Assessment: post-procedure vital signs reviewed and stable Respiratory status: spontaneous breathing, nonlabored ventilation, respiratory function stable and patient connected to nasal cannula oxygen Cardiovascular status: blood pressure returned to baseline and stable Postop Assessment: no apparent nausea or vomiting Anesthetic complications: no  No notable events documented.   Last Vitals:  Vitals:   12/08/23 1230 12/08/23 1245  BP: 131/88 (!) 146/77  Pulse: 60 (!) 59  Resp: 10 14  Temp:    SpO2: 97% 95%    Last Pain:  Vitals:   12/08/23 1245  TempSrc:   PainSc: 0-No pain                 Stephanie Coup

## 2023-12-08 NOTE — Transfer of Care (Signed)
Immediate Anesthesia Transfer of Care Note  Patient: Abigail Shaw  Procedure(s) Performed: CARDIOVERSION  Patient Location: PACU and Cath Lab  Anesthesia Type:General  Level of Consciousness: drowsy  Airway & Oxygen Therapy: Patient Spontanous Breathing and Patient connected to nasal cannula oxygen  Post-op Assessment: Report given to RN and Post -op Vital signs reviewed and stable  Post vital signs: Reviewed and stable  Last Vitals:  Vitals Value Taken Time  BP 134/74 12/08/23 1206  Temp    Pulse 60 12/08/23 1207  Resp 16 12/08/23 1207  SpO2 96 % 12/08/23 1207  Vitals shown include unfiled device data.  Last Pain:  Vitals:   12/08/23 1113  TempSrc: Oral  PainSc: 0-No pain         Complications: No notable events documented.

## 2023-12-08 NOTE — Anesthesia Procedure Notes (Signed)
Procedure Name: MAC Date/Time: 12/08/2023 11:44 AM  Performed by: Cheral Bay, CRNAPre-anesthesia Checklist: Patient identified, Emergency Drugs available, Suction available, Patient being monitored and Timeout performed Patient Re-evaluated:Patient Re-evaluated prior to induction Oxygen Delivery Method: Nasal cannula Induction Type: IV induction Placement Confirmation: positive ETCO2 and CO2 detector

## 2023-12-08 NOTE — Anesthesia Preprocedure Evaluation (Signed)
Anesthesia Evaluation  Patient identified by MRN, date of birth, ID band Patient awake    Reviewed: Allergy & Precautions, NPO status , Patient's Chart, lab work & pertinent test results  History of Anesthesia Complications Negative for: history of anesthetic complications  Airway Mallampati: II  TM Distance: >3 FB Neck ROM: Full    Dental  (+) Missing, Dental Advidsory Given   Pulmonary neg pulmonary ROS, neg sleep apnea, neg COPD   breath sounds clear to auscultation- rhonchi (-) wheezing      Cardiovascular hypertension, Pt. on medications (-) CAD, (-) Past MI, (-) Cardiac Stents and (-) CABG + dysrhythmias Atrial Fibrillation  Rhythm:Regular Rate:Normal - Systolic murmurs and - Diastolic murmurs    Neuro/Psych  Headaches PSYCHIATRIC DISORDERS  Depression       GI/Hepatic Neg liver ROS, hiatal hernia,GERD  ,,  Endo/Other  negative endocrine ROSneg diabetes    Renal/GU      Musculoskeletal   Abdominal   Peds  Hematology  (+) Blood dyscrasia, anemia   Anesthesia Other Findings Past Medical History: No date: Arthritis     Comment:  joints No date: Breast cyst 2014: Cancer (HCC)     Comment:  right breast cancer and radiation No date: Complication of anesthesia     Comment:  difficulty waking up-quit breathing during first TDC,               bruising on jaw after surgery No date: Depression No date: GERD (gastroesophageal reflux disease) No date: Glaucoma No date: Headache     Comment:  migraines, none for awhile No date: History of hiatal hernia No date: Hypercholesteremia No date: Hypertension     Comment:  controlled on meds No date: IBS (irritable bowel syndrome) No date: Iron deficiency anemia No date: Joint pain No date: Motion sickness     Comment:  ferry, cars No date: Osteoporosis 2014: Personal history of radiation therapy     Comment:  Right breast 2012?: Shingles     Comment:  right  side No date: Wears dentures     Comment:  partial upper (1 tooth)   Reproductive/Obstetrics                             Anesthesia Physical Anesthesia Plan  ASA: 3  Anesthesia Plan: General   Post-op Pain Management: Minimal or no pain anticipated   Induction: Intravenous  PONV Risk Score and Plan: 3 and Propofol infusion and TIVA  Airway Management Planned: Nasal Cannula  Additional Equipment: None  Intra-op Plan:   Post-operative Plan:   Informed Consent: I have reviewed the patients History and Physical, chart, labs and discussed the procedure including the risks, benefits and alternatives for the proposed anesthesia with the patient or authorized representative who has indicated his/her understanding and acceptance.     Dental advisory given  Plan Discussed with: CRNA and Surgeon  Anesthesia Plan Comments: (Discussed risks of anesthesia with patient, including possibility of difficulty with spontaneous ventilation under anesthesia necessitating airway intervention, PONV, and rare risks such as cardiac or respiratory or neurological events, and allergic reactions. Discussed the role of CRNA in patient's perioperative care. Patient understands.)       Anesthesia Quick Evaluation

## 2023-12-11 ENCOUNTER — Encounter: Payer: Self-pay | Admitting: Internal Medicine

## 2023-12-16 ENCOUNTER — Other Ambulatory Visit: Payer: Self-pay

## 2023-12-16 ENCOUNTER — Emergency Department
Admission: EM | Admit: 2023-12-16 | Discharge: 2023-12-16 | Disposition: A | Discharge status: Home / Self Care | Payer: Medicare PPO | Attending: Emergency Medicine | Admitting: Emergency Medicine

## 2023-12-16 DIAGNOSIS — Z853 Personal history of malignant neoplasm of breast: Secondary | ICD-10-CM | POA: Diagnosis not present

## 2023-12-16 DIAGNOSIS — R21 Rash and other nonspecific skin eruption: Secondary | ICD-10-CM | POA: Insufficient documentation

## 2023-12-16 DIAGNOSIS — I10 Essential (primary) hypertension: Secondary | ICD-10-CM | POA: Insufficient documentation

## 2023-12-16 MED ORDER — DIPHENHYDRAMINE HCL 25 MG PO CAPS
25.0000 mg | ORAL_CAPSULE | Freq: Once | ORAL | Status: AC
  Administered 2023-12-16: 25 mg via ORAL
  Filled 2023-12-16: qty 1

## 2023-12-16 MED ORDER — PREDNISONE 20 MG PO TABS
20.0000 mg | ORAL_TABLET | Freq: Once | ORAL | Status: AC
  Administered 2023-12-16: 20 mg via ORAL
  Filled 2023-12-16: qty 1

## 2023-12-16 MED ORDER — PREDNISONE 20 MG PO TABS: 20.0000 mg | ORAL_TABLET | Freq: Every day | ORAL | 0 refills | Status: AC

## 2023-12-16 NOTE — ED Triage Notes (Signed)
Dr. Juliann Pares referred pt to ED for rash. Pt has a complicated medical hx and there is concern it may be more than just a simple rash. Can reach out to cardiologist on call if questions or concerns.

## 2023-12-16 NOTE — ED Provider Notes (Signed)
[facility] Provider Note    Event Date/Time   First MD Initiated Contact with Patient 12/16/23 1309     (approximate)   History   Rash   HPI  Abigail Shaw is a 88 y.o. female with PMH of IBS, osteoporosis, HTN, breast cancer, depression presents for evaluation of a rash.  Patient has had multiple sites of this rash.  She states that about a week ago she developed a rash on the anterior right lower leg, it started as a small spot and has increased in size.  This area is itchy.  A couple days ago she developed a rash on the bilateral antecubital fossa, this is very itchy and has spread up and down her arms.  She has tried applying hydrocortisone cream to the area which helps with the itching but has not resolved the rash.  2 days ago she noticed a rash behind the bilateral knees to which she applied hydrocortisone cream and it has resolved.  Patient was recently started on new cardiac medications about a week ago. No fevers at home.      Physical Exam   Triage Vital Signs: ED Triage Vitals [12/16/23 1210]  Encounter Vitals Group     BP 112/67     Systolic BP Percentile      Diastolic BP Percentile      Pulse Rate 68     Resp 18     Temp 98.1 F (36.7 C)     Temp Source Oral     SpO2 100 %     Weight 156 lb (70.8 kg)     Height 5\' 2"  (1.575 m)     Head Circumference      Peak Flow      Pain Score 0     Pain Loc      Pain Education      Exclude from Growth Chart     Most recent vital signs: Vitals:   12/16/23 1210  BP: 112/67  Pulse: 68  Resp: 18  Temp: 98.1 F (36.7 C)  SpO2: 100%   General: Awake, no distress.  CV:  Good peripheral perfusion.  Resp:  Normal effort.  Abd:  No distention.  Right anterior shin:  Erythematous patch approximately 5 cm in diameter, does NOT blanch with pressure, no sloughing Bilateral antecubital fossa area/anterior arms: Erythematous maculopapular rash that DOES blanch with pressure, no  sloughing Bilateral posterior knees: No evidence of rash at this time patient Other: No evidence of lesions in patient's mouth   ED Results / Procedures / Treatments   Labs (all labs ordered are listed, but only abnormal results are displayed) Labs Reviewed - No data to display   PROCEDURES:  Critical Care performed: No  Procedures   MEDICATIONS ORDERED IN ED: Medications - No data to display   IMPRESSION / MDM / ASSESSMENT AND PLAN / ED COURSE  I reviewed the triage vital signs and the nursing notes.                              Differential diagnosis includes, but is not limited to, contact dermatitis, drug reaction, urticaria, hypersensitivity vasculitis,.  Patient's presentation is most consistent with acute, uncomplicated illness.  It appears that patient has two different rashes. The one on her arms is more consistent with an allergic reaction and appears like erythema multiform. The one on her shin looks more like a vasculitis.  Patient was recently started on 4 different cardiac medications that she had a recent cardioversion, any of which could be the culprit of an allergic reaction.  Patient's not presenting with any signs concerning for anaphylaxis.  She is not having any difficulty breathing there is no tongue or lip swelling.  No evidence of rash on her face.  She will be given prednisone and Benadryl while in the ED.  I will discharge her with prednisone and advised her to take Benadryl at home.  She can continue to apply hydrocortisone cream over the rash to help with the itching symptoms.  I also advised close follow-up with her cardiologist, Dr. Juliann Pares, to discuss possible medication changes if needed.  I do not feel comfortable stopping any of patient's new cardiac medications today.  Patient was seen in conjunction with my attending physician, Dr. Cyril Loosen, who is in agreement with plan.  Patient voiced understanding, all questions were answered and she was  stable at discharge.      FINAL CLINICAL IMPRESSION(S) / ED DIAGNOSES   Final diagnoses:  None     Rx / DC Orders   ED Discharge Orders     None        Note:  This document was prepared using Dragon voice recognition software and may include unintentional dictation errors.   Cameron Ali, PA-C 12/16/23 1425    Jene Every, MD 12/16/23 1540

## 2023-12-16 NOTE — Discharge Instructions (Addendum)
Please contact Dr. Juliann Pares and schedule an appointment.  I believe your rash may be a reaction to the new medications you started.  Even though you may be having an allergic reaction to the new medications, please continue to take them until you are evaluated by Dr. Juliann Pares.  Please take the prednisone as prescribed.  You can also take Benadryl daily to help with the itching symptoms.  Keep in mind this medication can be sedating so do not drive after taking it.  Please return to the ED if the rash is spreading to your face, lips, tongue or eyes.  Return to the ED if you have any lip or tongue swelling or difficulty breathing.

## 2023-12-16 NOTE — ED Triage Notes (Signed)
Pt reports started with a rash on her right lower leg and both elbow creases for the past few days. Pt states that the areas itch. Pt reports she started on some new cardiac meds a week ago ( Coreg, Entresto, aldactone, and toresemide). Pt reports she called her cardiologist Dr. Juliann Pares and was advised to come to the ED. Denies SOB, CP, or other sx's.

## 2024-01-03 ENCOUNTER — Telehealth: Payer: Self-pay

## 2024-01-03 NOTE — Telephone Encounter (Signed)
 [facility] called with hopes of scheduling an appointment for his wife, Abigail Shaw. They can be reached at [phone].

## 2024-01-11 ENCOUNTER — Other Ambulatory Visit
Admission: RE | Admit: 2024-01-11 | Discharge: 2024-01-11 | Disposition: A | Discharge status: Home / Self Care | Payer: Medicare PPO | Source: Ambulatory Visit | Attending: Internal Medicine | Admitting: Internal Medicine

## 2024-01-11 DIAGNOSIS — R0600 Dyspnea, unspecified: Secondary | ICD-10-CM | POA: Diagnosis not present

## 2024-01-11 DIAGNOSIS — E782 Mixed hyperlipidemia: Secondary | ICD-10-CM | POA: Diagnosis present

## 2024-01-11 DIAGNOSIS — R0602 Shortness of breath: Secondary | ICD-10-CM | POA: Insufficient documentation

## 2024-01-11 DIAGNOSIS — I2510 Atherosclerotic heart disease of native coronary artery without angina pectoris: Secondary | ICD-10-CM | POA: Diagnosis present

## 2024-01-11 DIAGNOSIS — I480 Paroxysmal atrial fibrillation: Secondary | ICD-10-CM | POA: Diagnosis present

## 2024-01-11 LAB — BRAIN NATRIURETIC PEPTIDE: B Natriuretic Peptide: 204 pg/mL — ABNORMAL HIGH (ref 0.0–100.0)

## 2024-01-22 ENCOUNTER — Encounter: Payer: Self-pay | Admitting: Surgery

## 2024-01-22 ENCOUNTER — Ambulatory Visit (INDEPENDENT_AMBULATORY_CARE_PROVIDER_SITE_OTHER): Payer: Medicare PPO | Admitting: Surgery

## 2024-01-22 VITALS — BP 115/71 | HR 60 | Temp 98.2°F | Ht 62.75 in | Wt 162.0 lb

## 2024-01-22 DIAGNOSIS — K219 Gastro-esophageal reflux disease without esophagitis: Secondary | ICD-10-CM

## 2024-01-22 DIAGNOSIS — K449 Diaphragmatic hernia without obstruction or gangrene: Secondary | ICD-10-CM

## 2024-01-22 NOTE — Patient Instructions (Addendum)
Your CT and Barium Swallow is scheduled for 01/30/2024 3 pm (arrive by 2:30pm) at [facility].   Your Stomach Emptying test is scheduled for 01/26/2024 8:30 am (arrive by 8 am). Nothing to eat or drink after midnight and no Pantoprazole after Midnight.   A referral has been placed with GI (Dr. Norma Fredrickson).   Hiatal Hernia  A hiatal hernia occurs when part of the stomach slides above the muscle that separates the abdomen from the chest (diaphragm). A person can be born with a hiatal hernia (congenital), or it may develop over time. In almost all cases of hiatal hernia, only the top part of the stomach pushes through the diaphragm. Many people have a hiatal hernia with no symptoms. The larger the hernia, the more likely it is that you will have symptoms. In some cases, a hiatal hernia allows stomach acid to flow back into the tube that carries food from your mouth to your stomach (esophagus). This may cause heartburn symptoms. The development of heartburn symptoms may mean that you have a condition called gastroesophageal reflux disease (GERD). What are the causes? This condition is caused by a weakness in the opening (hiatus) where the esophagus passes through the diaphragm to attach to the upper part of the stomach. A person may be born with a weakness in the hiatus, or a weakness can develop over time. What increases the risk? This condition is more likely to develop in: Older people. Age is a major risk factor for a hiatal hernia, especially if you are over the age of 88. Pregnant women. People who are overweight. People who have frequent constipation. What are the signs or symptoms? Symptoms of this condition usually develop in the form of GERD symptoms. Symptoms include: Heartburn. Upset stomach (indigestion). Trouble swallowing. Coughing or wheezing. Wheezing is making high-pitched whistling sounds when you breathe. Sore throat. Chest pain. Nausea and vomiting. How is this  diagnosed? This condition may be diagnosed during testing for GERD. Tests that may be done include: X-rays of your stomach or chest. An upper gastrointestinal (GI) series. This is an X-ray exam of your GI tract that is taken after you swallow a chalky liquid that shows up clearly on the X-ray. Endoscopy. This is a procedure to look into your stomach using a thin, flexible tube that has a tiny camera and light on the end of it. How is this treated? This condition may be treated by: Dietary and lifestyle changes to help reduce GERD symptoms. Medicines. These may include: Over-the-counter antacids. Medicines that make your stomach empty more quickly. Medicines that block the production of stomach acid (H2 blockers). Stronger medicines to reduce stomach acid (proton pump inhibitors). Surgery to repair the hernia, if other treatments are not helping. If you have no symptoms, you may not need treatment. Follow these instructions at home: Lifestyle and activity Do not use any products that contain nicotine or tobacco. These products include cigarettes, chewing tobacco, and vaping devices, such as e-cigarettes. If you need help quitting, ask your health care provider. Try to achieve and maintain a healthy body weight. Avoid putting pressure on your abdomen. Anything that puts pressure on your abdomen increases the amount of acid that may be pushed up into your esophagus. Avoid bending over, especially after eating. Raise the head of your bed by putting blocks under the legs. This keeps your head and esophagus higher than your stomach. Do not wear tight clothing around your chest or stomach. Try not to strain when having  a bowel movement, when urinating, or when lifting heavy objects. Eating and drinking Avoid foods that can worsen GERD symptoms. These may include: Fatty foods, like fried foods. Citrus fruits, like oranges or lemon. Other foods and drinks that contain acid, like orange juice or  tomatoes. Spicy food. Chocolate. Eat frequent small meals instead of three large meals a day. This helps prevent your stomach from getting too full. Eat slowly. Do not lie down right after eating. Do not eat 1-2 hours before bed. Do not drink beverages with caffeine. These include cola, coffee, cocoa, and tea. Do not drink alcohol. General instructions Take over-the-counter and prescription medicines only as told by your health care provider. Keep all follow-up visits. Your health care provider will want to check that any new prescribed medicines are helping your symptoms. Contact a health care provider if: Your symptoms are not controlled with medicines or lifestyle changes. You are having trouble swallowing. You have coughing or wheezing that will not go away. Your pain is getting worse. Your pain spreads to your arms, neck, jaw, teeth, or back. You feel nauseous or you vomit. Get help right away if: You have shortness of breath. You vomit blood. You have bright red blood in your stools. You have black, tarry stools. These symptoms may be an emergency. Get help right away. Call 911. Do not wait to see if the symptoms will go away. Do not drive yourself to the hospital. Summary A hiatal hernia occurs when part of the stomach slides above the muscle that separates the abdomen from the chest. A person may be born with a weakness in the hiatus, or a weakness can develop over time. Symptoms of a hiatal hernia may include heartburn, trouble swallowing, or sore throat. Management of a hiatal hernia includes eating frequent small meals instead of three large meals a day. Get help right away if you vomit blood, have bright red blood in your stools, or have black, tarry stools. This information is not intended to replace advice given to you by your health care provider. Make sure you discuss any questions you have with your health care provider. Document Revised: 02/08/2022 Document  Reviewed: 02/08/2022 Elsevier Patient Education  2024 [employer].

## 2024-01-24 NOTE — Progress Notes (Signed)
Patient ID: Abigail Shaw, female   DOB: 25-Nov-1935, 89 y.o.   MRN: [id number]  HPI NAHARA Shaw is a 89 y.o. female in consultation at the request of Dr.Aleskerov.  He endorses significant dry heaving and regurgitation as well as reflux. She reports cough for "years" she notes  she had cough daily. She has hx of GERD, OSA, sinusitis. Her cough is dry and non productive.  01/18/24- patient had good response to GI cocktail with lidocaine. She relates after drinking the lidocaine her cough is better. She noted resolution of symptoms and stopped taking anti reflux medications and notes symptoms have again recurred. She describes regurgitation and reflux "constant burping". She takes pantoprazole.  She noted burping and anorexia along with a feeling of fullness in her chest. She also noted constipation. She also notes fatigue and this has improved with cardioversions. She also notes edema and this is managed with torsemide 40 qod.  SHe Does have a significant history with heart failure requiring pacemaker.  She feels very chronically weak and depressed.  She seems to sleep a lot.  Seems Dr.Devore, heart failure tream  Did have a CT coronary that have personally reviewed showing bilateral pleural effusion and a very small hiatal hernia. EGD from 5 years ago showed a distal third esophageal diverticulum with a small hiatal hernia.  Please note I have also personally reviewed the images  HPI  Past Medical History:  Diagnosis Date   Arthritis    joints   Breast cyst    Cancer (HCC) 2014   right breast cancer and radiation   Complication of anesthesia    difficulty waking up-quit breathing during first TDC, bruising on jaw after surgery   Depression    GERD (gastroesophageal reflux disease)    Glaucoma    Headache    migraines, none for awhile   History of hiatal hernia    Hypercholesteremia    Hypertension    controlled on meds   IBS (irritable bowel syndrome)    Iron deficiency anemia     Joint pain    Motion sickness    ferry, cars   Osteoporosis    Personal history of radiation therapy 2014   Right breast   Shingles 2012?   right side   Wears dentures    partial upper (1 tooth)    Past Surgical History:  Procedure Laterality Date   ABDOMINAL HYSTERECTOMY  1976   BREAST BIOPSY Right 2014   Invasive Carcinoma   BREAST LUMPECTOMY Right 2014   + mammo cite rad and aromasin   BREAST MAMMOSITE Right 08-01-13   BREAST SURGERY Right 07-02-2013   with sentinal biopsy   BREAST SURGERY Right 07-18-13   CARDIAC CATHETERIZATION     CARDIOVERSION N/A 12/08/2023   Procedure: CARDIOVERSION;  Surgeon: Alwyn Pea, MD;  Location: [facility] ORS;  Service: Cardiovascular;  Laterality: N/A;   CATARACT EXTRACTION W/ INTRAOCULAR LENS  IMPLANT, BILATERAL     CATARACT EXTRACTION W/PHACO Left 06/26/2019   Procedure: Milon Dikes DUAL BLADE GONIOTOMY LEFT;  Surgeon: Lockie Mola, MD;  Location: [facility];  Service: Ophthalmology;  Laterality: Left;   COLONOSCOPY     Dr Bluford Kaufmann   COLONOSCOPY WITH PROPOFOL N/A 05/20/2020   Procedure: COLONOSCOPY WITH PROPOFOL;  Surgeon: Earline Mayotte, MD;  Location: [facility] ENDOSCOPY;  Service: Endoscopy;  Laterality: N/A;   ESOPHAGOGASTRODUODENOSCOPY (EGD) WITH PROPOFOL N/A 04/04/2018   Procedure: ESOPHAGOGASTRODUODENOSCOPY (EGD) WITH PROPOFOL;  Surgeon: Toledo, Boykin Nearing, MD;  Location: [facility] ENDOSCOPY;  Service: Gastroenterology;  Laterality: N/A;   EXCISION VAGINAL CYST     FOOT SURGERY Left 12/05/14   HAND SURGERY     trigger finger   MM BREAST STEREO BX*R*R/S Right 2014   PHOTOCOAGULATION Left 05/16/2018   Procedure: TRANSCLERAL DIODE CYCLOPHOTOCOAGULATION LEFT IVA BLOCK;  Surgeon: Lockie Mola, MD;  Location: [facility];  Service: Ophthalmology;  Laterality: Left;  IVA BLOCK LEFT   PHOTOCOAGULATION WITH LASER Left 08/22/2016   Procedure: PHOTOCOAGULATION WITH LASER;  Surgeon: Sherald Hess, MD;  Location: [facility]  SURGERY [facility];  Service: Ophthalmology;  Laterality: Left;  KEEP PT 2ND    Family History  Problem Relation Age of Onset   Cancer Mother        age 55 breast   Breast cancer Mother 54   Cancer Brother        prostate/lung/ liver /spleen   Breast cancer Maternal Aunt        age 28   Colon cancer Paternal Aunt     Social History Social History   Tobacco Use   Smoking status: Never    Passive exposure: Never   Smokeless tobacco: Never  Vaping Use   Vaping status: Never Used  Substance Use Topics   Alcohol use: No   Drug use: No    Allergies  Allergen Reactions   Lisinopril Cough        Simbrinza [Brinzolamide-Brimonidine]     lethargy   Sulfamethoxazole-Trimethoprim     Other reaction(s): Other (See Comments) HYPONATREMIA   Tape Other (See Comments)    Redness and irritation    Current Outpatient Medications  Medication Sig Dispense Refill   amiodarone (PACERONE) 100 MG tablet Take 100 mg by mouth daily.     cetirizine (ZYRTEC) 10 MG tablet Take 10 mg by mouth every evening.      cyanocobalamin (VITAMIN B12) 1000 MCG tablet Take 1,000 mcg by mouth daily.     diclofenac Sodium (VOLTAREN ARTHRITIS PAIN) 1 % GEL Apply 2 g topically 4 (four) times daily.     dorzolamide-timolol (COSOPT) 22.3-6.8 MG/ML ophthalmic solution Place 1 drop into both eyes at bedtime.     FLUoxetine (PROZAC) 20 MG capsule Take 20 mg by mouth every morning.     fluticasone (FLONASE) 50 MCG/ACT nasal spray Place 2 sprays into both nostrils daily. am     LORazepam (ATIVAN) 0.5 MG tablet Take 0.5 mg by mouth at bedtime.     losartan (COZAAR) 50 MG tablet Take 50 mg by mouth daily.     metoprolol tartrate (LOPRESSOR) 25 MG tablet Take 12.5 mg by mouth 2 (two) times daily.     olopatadine (PATADAY) 0.1 % ophthalmic solution Place 1 drop into both eyes daily.     pantoprazole (PROTONIX) 40 MG tablet Take 40 mg by mouth every morning.     prednisoLONE acetate (PREDNISOLONE ACETATE P-F) 1 % ophthalmic  suspension Place 1 drop into both eyes as needed.     rizatriptan (MAXALT) 10 MG tablet Take 10 mg by mouth as needed for migraine. May repeat in 2 hours if needed     Torsemide 40 MG TABS Take 40 mg by mouth daily.     Travoprost, BAK Free, (TRAVATAN) 0.004 % SOLN ophthalmic solution Place 1 drop into both eyes at bedtime.     Vitamin D, Ergocalciferol, (DRISDOL) 50000 UNITS CAPS capsule Take 50,000 Units by mouth once a week.     XARELTO 20 MG TABS tablet Take 20 mg by mouth  daily with breakfast.     No current facility-administered medications for this visit.     Review of Systems Full ROS  was asked and was negative except for the information on the HPI  Physical Exam Blood pressure 115/71, pulse 60, temperature 98.2 F (36.8 C), height 5' 2.75" (1.594 m), weight 162 lb (73.5 kg), SpO2 97%. CONSTITUTIONAL: SHe is debilitated and chronically ill.  She is dry heaving constantly and says seems to be okay when she stands up or when she sits up or straight. EYES: Pupils are equal, round, and reactive to light, Sclera are non-icteric. EARS, NOSE, MOUTH AND THROAT: The oropharynx is clear. The oral mucosa is pink and moist. Hearing is intact to voice. LYMPH NODES:  Lymph nodes in the neck are normal. RESPIRATORY:  Lungs are clear. There is normal respiratory effort, with equal breath sounds bilaterally, and without pathologic use of accessory muscles. CARDIOVASCULAR: Heart is regular without murmurs, gallops, or rubs. GI: The abdomen is  soft, nontender, and nondistended. There are no palpable masses. There is no hepatosplenomegaly. There are normal bowel sounds in all quadrants. GU: Rectal deferred.   MUSCULOSKELETAL: Normal muscle strength and tone. No cyanosis or edema.   SKIN: Turgor is good and there are no pathologic skin lesions or ulcers. NEUROLOGIC: Motor and sensation is grossly normal. Cranial nerves are grossly intact. PSYCH:  Oriented to person, place and time. Affect is  normal.  Data Reviewed  I have personally reviewed the patient's imaging, laboratory findings and medical records.    Assessment/Plan 89 year old female with recalcitrant reflux and Hiatal hernia causing significant symptoms   I had a good conversation with the patient  and family regarding her disease process.   Multiple  modalities for treatment including medical vs surgical intervention \\\ I do think at this time we are very early in the process and need to establish a more definitive diagnosis.  Given the atypical symptoms I definitely will like to get an EGD a CT scan of the abdomen and pelvis and a barium swallow.  In addition to this I will like to obtain also a gastric emptying study. Very frustrated but I was very honest with them regarding her disease process.  She seems to be miserable and I do have empathy for them. Also discussed with her that I was not sure whether or not she will be a good candidate for repair. I had also a good discussion regarding robotic paraesophageal hernia We specifically talked about the risk the benefits,  the possible complications as well as a diet medication that the surgery will entail. Risks include but not limited to: Bleeding, esophageal or bowel  injuries, bloating, dysphagia. Will also make sure that she gets cardiology preoperative mutation in case that we will need to do repair of hiatal hernia Will see her back once she finishes her workup please note that I spent 60 minutes in this encounter including personally reviewing medical records extensively, counseling the patient, placing orders and performing documentation   Sterling Big, MD FACS General Surgeon 01/24/2024, 5:11 PM

## 2024-01-26 ENCOUNTER — Encounter
Admission: RE | Admit: 2024-01-26 | Discharge: 2024-01-26 | Disposition: A | Discharge status: Home / Self Care | Payer: Medicare PPO | Source: Ambulatory Visit | Attending: Surgery | Admitting: Surgery

## 2024-01-26 DIAGNOSIS — K219 Gastro-esophageal reflux disease without esophagitis: Secondary | ICD-10-CM | POA: Diagnosis present

## 2024-01-26 MED ORDER — TECHNETIUM TC 99M SULFUR COLLOID
2.0000 | Freq: Once | INTRAVENOUS | Status: AC | PRN
  Administered 2024-01-26: 2.09 via ORAL

## 2024-01-29 ENCOUNTER — Telehealth: Payer: Self-pay

## 2024-01-29 NOTE — Telephone Encounter (Signed)
-----   Message from [facility] sent at 01/29/2024 10:55 AM EST ----- Please let her know her stomach functions normally ----- Message ----- From: Interface, Rad Results In Sent: 01/27/2024   9:25 AM EST To: Leafy Ro, MD

## 2024-01-29 NOTE — Telephone Encounter (Signed)
 Notified patient as instructed, patient pleased. Discussed follow-up appointments, patient agrees

## 2024-01-30 ENCOUNTER — Ambulatory Visit
Admission: RE | Admit: 2024-01-30 | Discharge: 2024-01-30 | Disposition: A | Discharge status: Home / Self Care | Payer: Medicare PPO | Source: Ambulatory Visit | Attending: Surgery | Admitting: Surgery

## 2024-01-30 ENCOUNTER — Ambulatory Visit
Admission: RE | Admit: 2024-01-30 | Discharge: 2024-01-30 | Disposition: A | Discharge status: Home / Self Care | Payer: Medicare PPO | Source: Ambulatory Visit | Attending: Surgery

## 2024-01-30 DIAGNOSIS — K219 Gastro-esophageal reflux disease without esophagitis: Secondary | ICD-10-CM | POA: Diagnosis present

## 2024-01-30 MED ORDER — IOHEXOL 300 MG/ML  SOLN
100.0000 mL | Freq: Once | INTRAMUSCULAR | Status: AC | PRN
  Administered 2024-01-30: 100 mL via INTRAVENOUS

## 2024-02-19 ENCOUNTER — Ambulatory Visit: Payer: Medicare PPO | Admitting: Surgery

## 2024-03-21 ENCOUNTER — Encounter: Payer: Self-pay | Admitting: Gastroenterology

## 2024-04-01 ENCOUNTER — Ambulatory Visit
Admission: RE | Admit: 2024-04-01 | Discharge: 2024-04-01 | Disposition: A | Discharge status: Home / Self Care | Payer: Medicare PPO | Attending: Gastroenterology | Admitting: Gastroenterology

## 2024-04-01 ENCOUNTER — Encounter: Payer: Self-pay | Admitting: Gastroenterology

## 2024-04-01 ENCOUNTER — Ambulatory Visit: Admitting: General Practice

## 2024-04-01 ENCOUNTER — Encounter
Admission: RE | Disposition: A | Discharge status: Home / Self Care | Payer: Self-pay | Source: Home / Self Care | Attending: Gastroenterology

## 2024-04-01 ENCOUNTER — Other Ambulatory Visit: Payer: Self-pay

## 2024-04-01 DIAGNOSIS — Z923 Personal history of irradiation: Secondary | ICD-10-CM | POA: Diagnosis not present

## 2024-04-01 DIAGNOSIS — I110 Hypertensive heart disease with heart failure: Secondary | ICD-10-CM | POA: Insufficient documentation

## 2024-04-01 DIAGNOSIS — I509 Heart failure, unspecified: Secondary | ICD-10-CM | POA: Insufficient documentation

## 2024-04-01 DIAGNOSIS — K449 Diaphragmatic hernia without obstruction or gangrene: Secondary | ICD-10-CM | POA: Insufficient documentation

## 2024-04-01 DIAGNOSIS — Q396 Congenital diverticulum of esophagus: Secondary | ICD-10-CM | POA: Insufficient documentation

## 2024-04-01 DIAGNOSIS — K317 Polyp of stomach and duodenum: Secondary | ICD-10-CM | POA: Insufficient documentation

## 2024-04-01 DIAGNOSIS — K219 Gastro-esophageal reflux disease without esophagitis: Secondary | ICD-10-CM | POA: Diagnosis present

## 2024-04-01 HISTORY — PX: ESOPHAGOGASTRODUODENOSCOPY (EGD) WITH PROPOFOL: SHX5813

## 2024-04-01 SURGERY — ESOPHAGOGASTRODUODENOSCOPY (EGD) WITH PROPOFOL
Anesthesia: General

## 2024-04-01 MED ORDER — DEXMEDETOMIDINE HCL IN NACL 80 MCG/20ML IV SOLN
INTRAVENOUS | Status: DC | PRN
Start: 2024-04-01 — End: 2024-04-01
  Administered 2024-04-01: 4 ug via INTRAVENOUS

## 2024-04-01 MED ORDER — LIDOCAINE HCL (PF) 2 % IJ SOLN
INTRAMUSCULAR | Status: AC
  Filled 2024-04-01: qty 5

## 2024-04-01 MED ORDER — SODIUM CHLORIDE 0.9 % IV SOLN: INTRAVENOUS | Status: DC

## 2024-04-01 MED ORDER — PROPOFOL 10 MG/ML IV BOLUS
INTRAVENOUS | Status: DC | PRN
  Administered 2024-04-01: 130 mg via INTRAVENOUS

## 2024-04-01 MED ORDER — LIDOCAINE HCL (CARDIAC) PF 100 MG/5ML IV SOSY
PREFILLED_SYRINGE | INTRAVENOUS | Status: DC | PRN
  Administered 2024-04-01: 100 mg via INTRAVENOUS

## 2024-04-01 MED ORDER — PROPOFOL 10 MG/ML IV BOLUS
INTRAVENOUS | Status: AC
  Filled 2024-04-01: qty 20

## 2024-04-01 NOTE — Anesthesia Preprocedure Evaluation (Signed)
 Anesthesia Evaluation  Patient identified by MRN, date of birth, ID band Patient awake    Reviewed: Allergy & Precautions, NPO status , Patient's Chart, lab work & pertinent test results  History of Anesthesia Complications Negative for: history of anesthetic complications  Airway Mallampati: II  TM Distance: >3 FB Neck ROM: Full    Dental  (+) Missing, Dental Advidsory Given   Pulmonary neg pulmonary ROS, neg sleep apnea, neg COPD   breath sounds clear to auscultation- rhonchi (-) wheezing      Cardiovascular hypertension, Pt. on medications +CHF  (-) CAD, (-) Past MI, (-) Cardiac Stents and (-) CABG + dysrhythmias Atrial Fibrillation + Valvular Problems/Murmurs MR and AI  Rhythm:Regular Rate:Normal - Systolic murmurs and - Diastolic murmurs    Neuro/Psych  Headaches PSYCHIATRIC DISORDERS  Depression       GI/Hepatic Neg liver ROS, hiatal hernia,GERD  ,,  Endo/Other  negative endocrine ROSneg diabetes    Renal/GU      Musculoskeletal   Abdominal   Peds  Hematology  (+) Blood dyscrasia, anemia   Anesthesia Other Findings Past Medical History: No date: Arthritis     Comment:  joints No date: Breast cyst 2014: Cancer (HCC)     Comment:  right breast cancer and radiation No date: Complication of anesthesia     Comment:  difficulty waking up-quit breathing during first TDC,               bruising on jaw after surgery No date: Depression No date: GERD (gastroesophageal reflux disease) No date: Glaucoma No date: Headache     Comment:  migraines, none for awhile No date: History of hiatal hernia No date: Hypercholesteremia No date: Hypertension     Comment:  controlled on meds No date: IBS (irritable bowel syndrome) No date: Iron deficiency anemia No date: Joint pain No date: Motion sickness     Comment:  ferry, cars No date: Osteoporosis 2014: Personal history of radiation therapy     Comment:  Right  breast 2012?: Shingles     Comment:  right side No date: Wears dentures     Comment:  partial upper (1 tooth)   Reproductive/Obstetrics                             Anesthesia Physical Anesthesia Plan  ASA: 3  Anesthesia Plan: General   Post-op Pain Management: Minimal or no pain anticipated   Induction: Intravenous  PONV Risk Score and Plan: 3 and Propofol infusion, TIVA and Ondansetron  Airway Management Planned: Nasal Cannula  Additional Equipment: None  Intra-op Plan:   Post-operative Plan:   Informed Consent: I have reviewed the patients History and Physical, chart, labs and discussed the procedure including the risks, benefits and alternatives for the proposed anesthesia with the patient or authorized representative who has indicated his/her understanding and acceptance.     Dental advisory given  Plan Discussed with: CRNA and Surgeon  Anesthesia Plan Comments: (Discussed risks of anesthesia with patient, including possibility of difficulty with spontaneous ventilation under anesthesia necessitating airway intervention, PONV, and rare risks such as cardiac or respiratory or neurological events, and allergic reactions. Discussed the role of CRNA in patient's perioperative care. Patient understands.)       Anesthesia Quick Evaluation

## 2024-04-01 NOTE — H&P (Signed)
 Pre-Procedure H&P   Patient ID: Abigail Shaw is a 89 y.o. female.  Gastroenterology Provider: Jaynie Collins, DO  Referring Provider:  Jacob Moores, PA PCP: Jerl Mina, MD  Date: 04/01/2024  HPI Ms. Abigail Shaw is a 89 y.o. female who presents today for Esophagogastroduodenoscopy for GERD, atypical chest pain, chronic cough, esophageal diverticulum.  Patient with a history of refractory GERD undergoing evaluation prior to hernia repair.  She notes regurgitation and dry heaving.  No dysphagia or odynophagia.  She does note early satiety.  Has a known thoracic esophageal diverticulum. Her belching and other symptoms have improved since FODMAP diet initiated. Only takes ppi once a day.  Gastric emptying study was negative  Xarelto has been held for this procedure (last dose Friday).  Patient is status post PPM.  Cardiology has raised question of amyloid  Last underwent EGD in 2019 with noted above diverticulum and torturous esophagus.  2 cm hiatal hernia but no esophageal stricture  Hemoglobin 12.5 MCV 88 platelets 246,000 creatinine 1.0   Past Medical History:  Diagnosis Date   Arthritis    joints   Breast cyst    Cancer (HCC) 2014   right breast cancer and radiation   Complication of anesthesia    difficulty waking up-quit breathing during first TDC, bruising on jaw after surgery   Depression    GERD (gastroesophageal reflux disease)    Glaucoma    Headache    migraines, none for awhile   History of hiatal hernia    Hypercholesteremia    Hypertension    controlled on meds   IBS (irritable bowel syndrome)    Iron deficiency anemia    Joint pain    Motion sickness    ferry, cars   Osteoporosis    Personal history of radiation therapy 2014   Right breast   Shingles 2012?   right side   Wears dentures    partial upper (1 tooth)    Past Surgical History:  Procedure Laterality Date   ABDOMINAL HYSTERECTOMY  1976   BREAST BIOPSY Right 2014    Invasive Carcinoma   BREAST LUMPECTOMY Right 2014   + mammo cite rad and aromasin   BREAST MAMMOSITE Right 08-01-13   BREAST SURGERY Right 07-02-2013   with sentinal biopsy   BREAST SURGERY Right 07-18-13   CARDIAC CATHETERIZATION     CARDIOVERSION N/A 12/08/2023   Procedure: CARDIOVERSION;  Surgeon: Alwyn Pea, MD;  Location: [facility] ORS;  Service: Cardiovascular;  Laterality: N/A;   CATARACT EXTRACTION W/ INTRAOCULAR LENS  IMPLANT, BILATERAL     CATARACT EXTRACTION W/PHACO Left 06/26/2019   Procedure: Milon Dikes DUAL BLADE GONIOTOMY LEFT;  Surgeon: Lockie Mola, MD;  Location: [facility];  Service: Ophthalmology;  Laterality: Left;   COLONOSCOPY     Dr Bluford Kaufmann   COLONOSCOPY WITH PROPOFOL N/A 05/20/2020   Procedure: COLONOSCOPY WITH PROPOFOL;  Surgeon: Earline Mayotte, MD;  Location: [facility] ENDOSCOPY;  Service: Endoscopy;  Laterality: N/A;   ESOPHAGOGASTRODUODENOSCOPY (EGD) WITH PROPOFOL N/A 04/04/2018   Procedure: ESOPHAGOGASTRODUODENOSCOPY (EGD) WITH PROPOFOL;  Surgeon: Toledo, Boykin Nearing, MD;  Location: [facility] ENDOSCOPY;  Service: Gastroenterology;  Laterality: N/A;   EXCISION VAGINAL CYST     FOOT SURGERY Left 12/05/14   HAND SURGERY     trigger finger   MM BREAST STEREO BX*R*R/S Right 2014   PHOTOCOAGULATION Left 05/16/2018   Procedure: TRANSCLERAL DIODE CYCLOPHOTOCOAGULATION LEFT IVA BLOCK;  Surgeon: Lockie Mola, MD;  Location: [facility];  Service: Ophthalmology;  Laterality: Left;  IVA BLOCK LEFT   PHOTOCOAGULATION WITH LASER Left 08/22/2016   Procedure: PHOTOCOAGULATION WITH LASER;  Surgeon: Sherald Hess, MD;  Location: [facility]ERY [facility];  Service: Ophthalmology;  Laterality: Left;  KEEP PT 2ND    Family History Paternal Aunt- CRC; Sister- colon polyps No h/o GI disease or malignancy  Review of Systems  Constitutional:  Negative for activity change, appetite change, chills, diaphoresis, fatigue, fever and unexpected weight change.  HENT:   Negative for trouble swallowing and voice change.   Respiratory:  Negative for shortness of breath and wheezing.   Cardiovascular:  Negative for chest pain, palpitations and leg swelling.  Gastrointestinal:  Positive for abdominal pain. Negative for abdominal distention, anal bleeding, blood in stool, constipation, diarrhea, nausea, rectal pain and vomiting.  Musculoskeletal:  Negative for arthralgias and myalgias.  Skin:  Negative for color change and pallor.  Neurological:  Negative for dizziness, syncope and weakness.  Psychiatric/Behavioral:  Negative for confusion.   All other systems reviewed and are negative.    Medications No current facility-administered medications on file prior to encounter.   Current Outpatient Medications on File Prior to Encounter  Medication Sig Dispense Refill   amiodarone (PACERONE) 100 MG tablet Take 100 mg by mouth daily.     cetirizine (ZYRTEC) 10 MG tablet Take 10 mg by mouth every evening.      cyanocobalamin (VITAMIN B12) 1000 MCG tablet Take 1,000 mcg by mouth daily.     diclofenac Sodium (VOLTAREN ARTHRITIS PAIN) 1 % GEL Apply 2 g topically 4 (four) times daily.     FLUoxetine (PROZAC) 20 MG capsule Take 20 mg by mouth every morning.     fluticasone (FLONASE) 50 MCG/ACT nasal spray Place 2 sprays into both nostrils daily. am     LORazepam (ATIVAN) 0.5 MG tablet Take 0.5 mg by mouth at bedtime.     losartan (COZAAR) 50 MG tablet Take 50 mg by mouth daily.     metoprolol tartrate (LOPRESSOR) 25 MG tablet Take 12.5 mg by mouth 2 (two) times daily.     olopatadine (PATADAY) 0.1 % ophthalmic solution Place 1 drop into both eyes daily.     pantoprazole (PROTONIX) 40 MG tablet Take 40 mg by mouth every morning.     prednisoLONE acetate (PREDNISOLONE ACETATE P-F) 1 % ophthalmic suspension Place 1 drop into both eyes as needed.     rizatriptan (MAXALT) 10 MG tablet Take 10 mg by mouth as needed for migraine. May repeat in 2 hours if needed     Torsemide  40 MG TABS Take 40 mg by mouth daily.     Travoprost, BAK Free, (TRAVATAN) 0.004 % SOLN ophthalmic solution Place 1 drop into both eyes at bedtime.     Vitamin D, Ergocalciferol, (DRISDOL) 50000 UNITS CAPS capsule Take 50,000 Units by mouth once a week.     XARELTO 20 MG TABS tablet Take 20 mg by mouth daily with breakfast.     dorzolamide-timolol (COSOPT) 22.3-6.8 MG/ML ophthalmic solution Place 1 drop into both eyes at bedtime.      Pertinent medications related to GI and procedure were reviewed by me with the patient prior to the procedure   Current Facility-Administered Medications:    0.9 %  sodium chloride infusion, , Intravenous, Continuous, Jaynie Collins, DO, Last Rate: 20 mL/hr at 04/01/24 1211, New Bag at 04/01/24 1211  sodium chloride 20 mL/hr at 04/01/24 1211       Allergies  Allergen  Reactions   Lisinopril Cough        Simbrinza [Brinzolamide-Brimonidine]     lethargy   Sulfamethoxazole-Trimethoprim     Other reaction(s): Other (See Comments) HYPONATREMIA   Tape Other (See Comments)    Redness and irritation   Allergies were reviewed by me prior to the procedure  Objective   Body mass index is 29.08 kg/m. Vitals:   04/01/24 1202  BP: (!) 156/77  Pulse: 61  Resp: 16  Temp: (!) 96.4 F (35.8 C)  TempSrc: Temporal  SpO2: 98%  Weight: 72.1 kg  Height: 5\' 2"  (1.575 m)     Physical Exam Vitals and nursing note reviewed.  Constitutional:      General: She is not in acute distress.    Appearance: Normal appearance. She is not ill-appearing, toxic-appearing or diaphoretic.  HENT:     Head: Normocephalic and atraumatic.     Nose: Nose normal.     Mouth/Throat:     Mouth: Mucous membranes are moist.     Pharynx: Oropharynx is clear.  Eyes:     General: No scleral icterus.    Extraocular Movements: Extraocular movements intact.  Cardiovascular:     Rate and Rhythm: Normal rate and regular rhythm.     Heart sounds: Normal heart sounds. No murmur  heard.    No friction rub. No gallop.  Pulmonary:     Effort: Pulmonary effort is normal. No respiratory distress.     Breath sounds: Normal breath sounds. No wheezing, rhonchi or rales.  Abdominal:     General: Bowel sounds are normal. There is no distension.     Palpations: Abdomen is soft.     Tenderness: There is no abdominal tenderness. There is no guarding or rebound.  Musculoskeletal:     Cervical back: Neck supple.     Right lower leg: No edema.     Left lower leg: No edema.  Skin:    General: Skin is warm and dry.     Coloration: Skin is not jaundiced or pale.  Neurological:     General: No focal deficit present.     Mental Status: She is alert and oriented to person, place, and time. Mental status is at baseline.  Psychiatric:        Mood and Affect: Mood normal.        Behavior: Behavior normal.        Thought Content: Thought content normal.        Judgment: Judgment normal.      Assessment:  Ms. Abigail Shaw is a 89 y.o. female  who presents today for Esophagogastroduodenoscopy for GERD, atypical chest pain, chronic cough, esophageal diverticulum .  Plan:  Esophagogastroduodenoscopy with possible intervention today  Esophagogastroduodenoscopy with possible biopsy, control of bleeding, polypectomy, and interventions as necessary has been discussed with the patient/patient representative. Informed consent was obtained from the patient/patient representative after explaining the indication, nature, and risks of the procedure including but not limited to death, bleeding, perforation, missed neoplasm/lesions, cardiorespiratory compromise, and reaction to medications. Opportunity for questions was given and appropriate answers were provided. Patient/patient representative has verbalized understanding is amenable to undergoing the procedure.   Jaynie Collins, DO  [facility]astroenter[facility]  Portions of the record may have been created with voice  recognition software. Occasional wrong-word or 'sound-a-like' substitutions may have occurred due to the inherent limitations of voice recognition software.  Read the chart carefully and recognize, using context, where substitutions may have occurred.

## 2024-04-01 NOTE — Op Note (Signed)
 [facility] Gastroenterology Patient Name: Abigail Shaw Procedure Date: 04/01/2024 12:46 PM MRN: [id number] Account #: [id number] Date of Birth: [birth year] Admit Type: Outpatient Age: 89 Room: [facility] ENDO ROOM 2 Gender: Female Note Status: Finalized Instrument Name: Upper Endoscope [id number] Procedure:             Upper GI endoscopy Indications:           Follow-up of esophageal reflux Providers:             Jaynie Collins DO, DO Referring MD:          No Local Md, MD (Referring MD) Medicines:             Monitored Anesthesia Care Complications:         No immediate complications. Estimated blood loss:                         Minimal. Procedure:             Pre-Anesthesia Assessment:                        - Prior to the procedure, a History and Physical was                         performed, and patient medications and allergies were                         reviewed. The patient is competent. The risks and                         benefits of the procedure and the sedation options and                         risks were discussed with the patient. All questions                         were answered and informed consent was obtained.                         Patient identification and proposed procedure were                         verified by the physician, the nurse, the anesthetist                         and the technician in the endoscopy suite. Mental                         Status Examination: alert and oriented. Airway                         Examination: normal oropharyngeal airway and neck                         mobility. Respiratory Examination: clear to                         auscultation. CV Examination: RRR, no murmurs, no S3  or S4. Prophylactic Antibiotics: The patient does not                         require prophylactic antibiotics. Prior                         Anticoagulants: The patient has taken Xarelto                          (rivaroxaban), last dose was 3 days prior to                         procedure. ASA Grade Assessment: III - A patient with                         severe systemic disease. After reviewing the risks and                         benefits, the patient was deemed in satisfactory                         condition to undergo the procedure. The anesthesia                         plan was to use monitored anesthesia care (MAC).                         Immediately prior to administration of medications,                         the patient was re-assessed for adequacy to receive                         sedatives. The heart rate, respiratory rate, oxygen                         saturations, blood pressure, adequacy of pulmonary                         ventilation, and response to care were monitored                         throughout the procedure. The physical status of the                         patient was re-assessed after the procedure.                        After obtaining informed consent, the endoscope was                         passed under direct vision. Throughout the procedure,                         the patient's blood pressure, pulse, and oxygen                         saturations were monitored continuously. The  Endosonoscope was introduced through the mouth, and                         advanced to the third part of duodenum. The upper GI                         endoscopy was accomplished without difficulty. The                         patient tolerated the procedure well. Findings:      The duodenal bulb, first portion of the duodenum, second portion of the       duodenum and third portion of the duodenum were normal. Biopsies for       histology were taken with a cold forceps for evaluation of celiac       disease. Estimated blood loss was minimal.      Multiple 1 to 8 mm sessile polyps with no bleeding and no stigmata of       recent bleeding  were found in the gastric fundus. Biopsies were taken       with a cold forceps for histology.      Normal mucosa was found in the entire examined stomach. Biopsies were       taken with a cold forceps for Helicobacter pylori testing. Estimated       blood loss was minimal.      A small hiatal hernia was present. Estimated blood loss: none.      The Z-line was regular. Estimated blood loss: none.      Esophagogastric landmarks were identified: the gastroesophageal junction       was found at 37 cm from the incisors.      Normal mucosa was found in the entire esophagus. Biopsies were taken       with a cold forceps for histology. Estimated blood loss was minimal.      A non-bleeding diverticulum with a small opening and no stigmata of       recent bleeding was found in the middle third of the esophagus. at 27 cm       from the incisors. Estimated blood loss: none.      The exam of the esophagus was otherwise normal. Impression:            - Normal duodenal bulb, first portion of the duodenum,                         second portion of the duodenum and third portion of                         the duodenum. Biopsied.                        - Multiple gastric polyps. Biopsied.                        - Normal mucosa was found in the entire stomach.                         Biopsied.                        - Small hiatal hernia.                        -  Z-line regular.                        - Esophagogastric landmarks identified.                        - Normal mucosa was found in the entire esophagus.                         Biopsied.                        - Diverticulum in the middle third of the esophagus. Recommendation:        - Patient has a contact number available for                         emergencies. The signs and symptoms of potential                         delayed complications were discussed with the patient.                         Return to normal activities tomorrow.  Written                         discharge instructions were provided to the patient.                        - Discharge patient to home.                        - Soft diet today.                        - Continue present medications.                        - Resume Xarelto (rivaroxaban) at prior dose in 2                         days. Refer to managing physician for further                         adjustment of therapy.                        - Await pathology results.                        - Return to GI clinic as previously scheduled.                        - Recommend maximizing ppi therapy or trial of                         voquenza for reflux symptoms                        Continue FODMAP Diet                        Given the small size  of the hiatal hernia and need to                         try other modalities to see if this improves reflux-                         would wait on surgical intervention at this time.                        Given esophageal diverticulum, would consider motility                         and manometry study prior to hiatal hernia repair.                        Could also consider Bravo study if reflux persists                         beyond pharmacologic treatment to assess if truly                         reflux symptoms versus NERD or functional dyspepsia                        - The findings and recommendations were discussed with                         the patient.                        - The findings and recommendations were discussed with                         the patient's family. Procedure Code(s):     --- Professional ---                        [phone], Esophagogastroduodenoscopy, flexible,                         transoral; with biopsy, single or multiple Diagnosis Code(s):     --- Professional ---                        K31.7, Polyp of stomach and duodenum                        K44.9, Diaphragmatic hernia without obstruction or                          gangrene                        Q39.6, Congenital diverticulum of esophagus                        K21.9, Gastro-esophageal reflux disease without                         esophagitis CPT copyright 2022 American Medical Association. All rights reserved. The codes documented in this report are preliminary and upon coder  review may  be revised to meet current compliance requirements. Attending Participation:      I personally performed the entire procedure. Elfredia Nevins, DO Jaynie Collins DO, DO 04/01/2024 1:11:50 PM This report has been signed electronically. Number of Addenda: 0 Note Initiated On: 04/01/2024 12:46 PM Estimated Blood Loss:  Estimated blood loss was minimal.      [facility]

## 2024-04-01 NOTE — Interval H&P Note (Signed)
 History and Physical Interval Note: Preprocedure H&P from 04/01/24  was reviewed and there was no interval change after seeing and examining the patient.  Written consent was obtained from the patient after discussion of risks, benefits, and alternatives. Patient has consented to proceed with Esophagogastroduodenoscopy with possible intervention   04/01/2024 12:45 PM  Abigail Shaw  has presented today for surgery, with the diagnosis of K21.9, K44.9 (ICD-10-CM) - Gastroesophageal reflux disease with hiatal hernia R07.89 (ICD-10-CM) - Atypical chest pain Q39.6 (ICD-10-CM) - Esophageal diverticulum F62.130 (ICD-10-CM) - History of chronic cough.  The various methods of treatment have been discussed with the patient and family. After consideration of risks, benefits and other options for treatment, the patient has consented to  Procedure(s): ESOPHAGOGASTRODUODENOSCOPY (EGD) WITH PROPOFOL (N/A) as a surgical intervention.  The patient's history has been reviewed, patient examined, no change in status, stable for surgery.  I have reviewed the patient's chart and labs.  Questions were answered to the patient's satisfaction.     Jaynie Collins

## 2024-04-01 NOTE — Transfer of Care (Signed)
 Immediate Anesthesia Transfer of Care Note  Patient: RAYSSA ATHA  Procedure(s) Performed: ESOPHAGOGASTRODUODENOSCOPY (EGD) WITH PROPOFOL  Patient Location: Endoscopy Unit  Anesthesia Type:General  Level of Consciousness: drowsy and responds to stimulation  Airway & Oxygen Therapy: Patient Spontanous Breathing  Post-op Assessment: Report given to RN and Post -op Vital signs reviewed and stable  Post vital signs: Reviewed and stable  Last Vitals:  Vitals Value Taken Time  BP 139/70 04/01/24 1303  Temp 35.9 C 04/01/24 1303  Pulse 60 04/01/24 1303  Resp 12 04/01/24 1303  SpO2 99     Last Pain:  Vitals:   04/01/24 1303  TempSrc: Temporal  PainSc: Asleep         Complications: No notable events documented.

## 2024-04-02 ENCOUNTER — Encounter: Payer: Self-pay | Admitting: Gastroenterology

## 2024-04-02 LAB — SURGICAL PATHOLOGY

## 2024-04-02 NOTE — Anesthesia Postprocedure Evaluation (Signed)
 Anesthesia Post Note  Patient: Abigail Shaw  Procedure(s) Performed: ESOPHAGOGASTRODUODENOSCOPY (EGD) WITH PROPOFOL  Patient location during evaluation: Endoscopy Anesthesia Type: General Level of consciousness: awake and alert Pain management: pain level controlled Vital Signs Assessment: post-procedure vital signs reviewed and stable Respiratory status: spontaneous breathing, nonlabored ventilation, respiratory function stable and patient connected to nasal cannula oxygen Cardiovascular status: blood pressure returned to baseline and stable Postop Assessment: no apparent nausea or vomiting Anesthetic complications: no   No notable events documented.   Last Vitals:  Vitals:   04/01/24 1303 04/01/24 1313  BP: 139/70 (!) 145/74  Pulse: 60 (!) 59  Resp: 12 19  Temp: (!) 35.9 C   SpO2: 98% 98%    Last Pain:  Vitals:   04/01/24 1313  TempSrc:   PainSc: 0-No pain                 Stephanie Coup

## 2024-06-14 NOTE — Progress Notes (Signed)
 Primary Care Provider: Valora Lynwood FALCON, MD [address] [place] [address]   Attending electrophysiologist: Franky Ned, MD  Patient Identification: Abigail Shaw is a 89 y.o. female here for follow-up of her pacemaker.  CLINICAL SUMMARY: Sick sinus syndrome [employer] Accolade dual-chamber pacemaker 10/26/2022 implanted  Franky Ned [place]   Atrial fibrillation documented on device interrogations, on amidoarone CHA2DS2-VASc 4 (hypertension, age >36, female) OAC with apixaban initiated 05/16/2023   Hypertension Hyperlipidemia Glaucoma Hx right breast CA  Interval History: History of Present Illness Abigail Shaw is an 89 year old female with atrial fibrillation who presents for follow-up regarding her cardiac device and atrial fibrillation.   Since her last visit in November, she has been experiencing fatigue and general malaise on some days. Engaging in extra activity requires a day or so to recover. No chest pain, shortness of breath, or palpitations are reported.   She experiences some swelling in her lower legs and ankles, for which she takes diuretics. She wears compression stockings regularly to manage cold legs and prevent swelling. She is currently on amiodarone 100 mg daily, losartan 50 mg daily, metoprolol 12.5 mg twice daily, and Xarelto 20 mg daily.   She was previously scheduled for an MRI to investigate possible amyloidosis, but it was canceled, and she does not recall the reason.    Problem List:  Specialty Problems       Cardiology Problems   Hyperlipidemia      Hypertension      SSS (sick sinus syndrome) (CMS/HHS-HCC)      Paroxysmal atrial fibrillation (CMS/HHS-HCC)      Patient Active Problem List  Diagnosis  . Hypertension  . Hyperlipidemia  . Arthritis  . Anemia  . Depression  . Glaucoma (increased eye pressure)  . Pseudoexfoliation glaucoma  . Acute pain of left shoulder  . Intermittent confusion,  unspecified  . Loss of memory  . SSS (sick sinus syndrome) (CMS/HHS-HCC)  . History of right breast cancer  . OSA (obstructive sleep apnea)  . Cardiac pacemaker in situ  . Paroxysmal atrial fibrillation (CMS/HHS-HCC)    Medications: Current Outpatient Medications  Medication Sig Dispense Refill  . AMIOdarone (PACERONE) 200 MG tablet Take 0.5 tablets (100 mg total) by mouth every morning    . cetirizine (ZYRTEC) 10 MG tablet Take 10 mg by mouth nightly.      . diclofenac (VOLTAREN) 1 % topical gel Apply 2 g topically 4 (four) times daily    . diphenhydrAMINE  (BENADRYL ) 25 mg capsule Take 25 mg by mouth every 6 (six) hours as needed for Itching    . dorzolamide-timoloL (COSOPT) 22.3-6.8 mg/mL ophthalmic solution Place 1 drop into both eyes 2 (two) times daily    . ergocalciferol, vitamin D2, 1,250 mcg (50,000 unit) capsule TAKE 1 CAPSULE BY MOUTH ONCE WEEKLY AS DIRECTED 12 capsule 3  . FLUoxetine (PROZAC) 20 MG capsule TAKE 2 CAPSULES BY MOUTH EVERY DAY 180 capsule 3  . fluticasone propionate (FLONASE) 50 mcg/actuation nasal spray TAKE 1 SPRAY INTO BOTH NOSTRILS ONCE A DAY AS NEEDED 48 g 3  . LORazepam  (ATIVAN ) 0.5 MG tablet TAKE ONE TABLET DAILY AS NEEDED 90 tablet 0  . losartan (COZAAR) 50 MG tablet TAKE 1 TABLET BY MOUTH DAILY 90 tablet 1  . metoprolol TARTrate (LOPRESSOR) 25 MG tablet TAKE 1/2 TABLET BY MOUTH TWICE DAILY 90 tablet 3  . pantoprazole (PROTONIX) 40 MG DR tablet TAKE ONE TABLET TWICE DAILY 30 MINUTES BEFORE MEALS 60 tablet  5  . prednisoLONE acetate (PRED FORTE) 1 % ophthalmic suspension Place 1 drop into both eyes    . rizatriptan (MAXALT) 10 MG tablet Take 10 mg by mouth as directed for Migraine May take a second dose after 2 hours if needed.    . torsemide 40 mg Tab Take 40 mg by mouth 2 (two) times daily for 360 days 180 tablet 3  . triamcinolone  0.1 % cream Apply topically continuously as needed    . XARELTO 20 mg tablet TAKE ONE TABLET BY MOUTH EVERY DAY WITH  BREAKFAST 30 tablet 11  . GI Cocktail (RULOX,LIDOCAINE ) suspension Take 30 mLs by mouth every 4 (four) hours as needed for Dyspepsia 1500 mL 1   No current facility-administered medications for this visit.    An updated, reconciled list of all medications, including OTC and herbal, was reviewed and a copy of the updated list was provided to the patient. Treatment plan and medications were discussed with the patient who voiced understanding. No barriers to learning.  Allergies: Allergies as of 06/14/2024 - Reviewed 06/14/2024  Allergen Reaction Noted  . Adhes. band-tape-benzalkonium Other (See Comments) 11/12/2020  . Gabapentin Swelling 07/06/2021  . Lisinopril Cough 08/28/2014  . Sulfamethoxazole -trimethoprim Other (See Comments) 08/28/2014  . Simbrinza [brinzolamide-brimonidine] Other (See Comments) 03/17/2022    ROS: Answers submitted by the patient for this visit: Recent Medical Symptoms (Submitted on 06/11/2024) Night sweats: No Appetite Loss: No Weight loss: No Fever: No Daytime sleepiness: No visual change: No Hearing loss: No Sore throat: No Hoarseness: No Cough: Yes Hemoptysis (Coughing up blood): No Shortness of breath: No Wheezing: No Chest pain: No Tachycardia (heart racing): No leg pain: No Nausea: No Vomiting: No Diarrhea: No Constipation: No Abdominal pain: No Bowel habits change: No Melena (Black tar-like stool): No Dysuria (Pain with urination): No Frequency (The need to urinate many times a day): Yes Hesitancy (Difficulty in beginning the flow of urine): No Joint pain: No Joint swelling: No Myalgias (Muscle pain): No Rash: No Pigment changes/discoloration: No Memory loss: No Syncope (Fainting or passing out): No Extremity weakness: No Paresthesias (Pins and Needles): No Loss of balance: Yes  Review of systems otherwise negative except as indicated above in the history.  Physical Exam:  Vitals: BP (!) 151/84 (BP Location: Left upper  arm, Patient Position: Sitting, BP Cuff Size: Adult)   Pulse 77   Resp 15   Ht 157.5 cm (5' 2)   Wt 74.1 kg (163 lb 6.4 oz)   SpO2 98%   BMI 29.89 kg/m   BMI: Body mass index is 29.89 kg/m.  General Appearance:  Appears well, no distress; Alert and oriented x 3  Lungs:   clear to auscultation, without rales or wheeze, good air exchange  Heart:  Normal rate, regular rhythm  Extremities: Extremities normal, no cyanosis. Trace edema Bilateral   Pulses: radial 2+ and symmetric bilaterally  Skin: No rashes or ulcers. Left infraclavicular device site appears well healed.  Neurologic: Alert, interactive, and appropriate, grossly moving all 4 extremities  ECG:   Procedure: Cardiac Device:  Device Type: [employer] Accolade dual-chamber pacemaker Implanted: 10/26/2022 ERI: 12 years  Mode: DDDR LRL: 60 bpm AP: 96% VP: 42% Presenting: AP-VS Underlying: sinus 41 bpm   AF burden: 1% Changes: 0  Please see separate procedure note for details of the device interrogation and programming.  Assessment/Plan: 1). [employer] Accolade dual-chamber pacemaker - normal functioning device and leads. No programming changes made today. Continue remote monitoring.  2). Atrial  Fibrillation - Currently on amiodarone 100 mg for rhythm control, and metoprolol 12.5 mg twice daily for rate control. Atrial fibrillation burden 1% on device interrogation today, appears to have decreased significantly since December 2024. Plan to continue current medication regimen. Orders placed for updated Thyroid  profile and Magnesium, she will obtain these at her upcoming visit with Dr. Florencio in July.  CHA2DS2-VASc 4, she is on Xarelto for [facility] which will be continued.   Follow-up: 6 months with Dr. Debby at [facility]  I spent a total of 35 minutes in both face-to-face and non-face-to-face activities, excluding procedures performed, for this visit on the date of this encounter.    Attestation  Statement:   I personally performed the service, non-incident to. (WP)   MARIE FISCHETTI HARDING, NP    This note has been created using automated tools and reviewed for accuracy by MARIE FISCHETTI HARDING.

## 2024-07-10 ENCOUNTER — Emergency Department

## 2024-07-10 ENCOUNTER — Other Ambulatory Visit: Payer: Self-pay | Admitting: Family Medicine

## 2024-07-10 ENCOUNTER — Emergency Department
Admission: EM | Admit: 2024-07-10 | Discharge: 2024-07-11 | Disposition: A | Discharge status: Home / Self Care | Attending: Emergency Medicine | Admitting: Emergency Medicine

## 2024-07-10 DIAGNOSIS — Z1231 Encounter for screening mammogram for malignant neoplasm of breast: Secondary | ICD-10-CM

## 2024-07-10 DIAGNOSIS — R944 Abnormal results of kidney function studies: Secondary | ICD-10-CM | POA: Diagnosis not present

## 2024-07-10 DIAGNOSIS — Z950 Presence of cardiac pacemaker: Secondary | ICD-10-CM | POA: Diagnosis not present

## 2024-07-10 DIAGNOSIS — E876 Hypokalemia: Secondary | ICD-10-CM | POA: Insufficient documentation

## 2024-07-10 DIAGNOSIS — R079 Chest pain, unspecified: Secondary | ICD-10-CM

## 2024-07-10 DIAGNOSIS — R040 Epistaxis: Secondary | ICD-10-CM | POA: Insufficient documentation

## 2024-07-10 DIAGNOSIS — R0789 Other chest pain: Secondary | ICD-10-CM | POA: Insufficient documentation

## 2024-07-10 LAB — CBC WITH DIFFERENTIAL/PLATELET
Abs Immature Granulocytes: 0.01 K/uL (ref 0.00–0.07)
Basophils Absolute: 0 K/uL (ref 0.0–0.1)
Basophils Relative: 1 %
Eosinophils Absolute: 0.1 K/uL (ref 0.0–0.5)
Eosinophils Relative: 2 %
HCT: 37.1 % (ref 36.0–46.0)
Hemoglobin: 12.3 g/dL (ref 12.0–15.0)
Immature Granulocytes: 0 %
Lymphocytes Relative: 39 %
Lymphs Abs: 2.3 K/uL (ref 0.7–4.0)
MCH: 28.5 pg (ref 26.0–34.0)
MCHC: 33.2 g/dL (ref 30.0–36.0)
MCV: 86.1 fL (ref 80.0–100.0)
Monocytes Absolute: 0.4 K/uL (ref 0.1–1.0)
Monocytes Relative: 7 %
Neutro Abs: 3.1 K/uL (ref 1.7–7.7)
Neutrophils Relative %: 51 %
Platelets: 239 K/uL (ref 150–400)
RBC: 4.31 MIL/uL (ref 3.87–5.11)
RDW: 14.2 % (ref 11.5–15.5)
WBC: 5.9 K/uL (ref 4.0–10.5)
nRBC: 0 % (ref 0.0–0.2)

## 2024-07-10 LAB — COMPREHENSIVE METABOLIC PANEL WITH GFR
ALT: 37 U/L (ref 0–44)
AST: 41 U/L (ref 15–41)
Albumin: 4.1 g/dL (ref 3.5–5.0)
Alkaline Phosphatase: 88 U/L (ref 38–126)
Anion gap: 10 (ref 5–15)
BUN: 24 mg/dL — ABNORMAL HIGH (ref 8–23)
CO2: 25 mmol/L (ref 22–32)
Calcium: 10.4 mg/dL — ABNORMAL HIGH (ref 8.9–10.3)
Chloride: 100 mmol/L (ref 98–111)
Creatinine, Ser: 1.03 mg/dL — ABNORMAL HIGH (ref 0.44–1.00)
GFR, Estimated: 53 mL/min — ABNORMAL LOW (ref >60)
Glucose, Bld: 107 mg/dL — ABNORMAL HIGH (ref 70–99)
Potassium: 3.3 mmol/L — ABNORMAL LOW (ref 3.5–5.1)
Sodium: 135 mmol/L (ref 135–145)
Total Bilirubin: 0.4 mg/dL (ref 0.0–1.2)
Total Protein: 7.5 g/dL (ref 6.5–8.1)

## 2024-07-10 LAB — TROPONIN I (HIGH SENSITIVITY)
Troponin I (High Sensitivity): 8 ng/L (ref <18)
Troponin I (High Sensitivity): 10 ng/L (ref <18)

## 2024-07-10 MED ORDER — NITROGLYCERIN 0.4 MG SL SUBL
0.4000 mg | SUBLINGUAL_TABLET | Freq: Once | SUBLINGUAL | Status: AC
  Administered 2024-07-10: 0.4 mg via SUBLINGUAL
  Filled 2024-07-10: qty 1

## 2024-07-10 MED ORDER — IOHEXOL 350 MG/ML SOLN
100.0000 mL | Freq: Once | INTRAVENOUS | Status: AC | PRN
  Administered 2024-07-10: 100 mL via INTRAVENOUS

## 2024-07-10 MED ORDER — ACETAMINOPHEN 500 MG PO TABS: 1000.0000 mg | ORAL_TABLET | Freq: Once | ORAL | Status: DC

## 2024-07-10 MED ORDER — OXYMETAZOLINE HCL 0.05 % NA SOLN
1.0000 | Freq: Once | NASAL | Status: AC
  Administered 2024-07-10: 1 via NASAL
  Filled 2024-07-10: qty 30

## 2024-07-10 NOTE — Discharge Instructions (Signed)
 Please call ENT to make a follow-up appointment.  If you develop a nosebleed squirt 1 spray of the Afrin and put the nasal clamp on and wait 15 minutes.  If the bleeding is still continuing then return to the ER for consideration of packing  For your chest pain your heart markers and CT scan were reassuring however you should call cardiology make a follow-up appointment return to the ER if you develop worsening symptoms or any other concerns

## 2024-07-10 NOTE — ED Triage Notes (Signed)
 Pt arrived via ems from [facility] for chest tightness and weakness. Pt does have a pace maker and does see a cardiologist regularly. Pt had a nose bleed today around 4pm this evening.

## 2024-07-10 NOTE — ED Provider Notes (Signed)
 [facility] Provider Note    Event Date/Time   First MD Initiated Contact with Patient 07/10/24 2039     (approximate)   History   No chief complaint on file.   HPI  Abigail Shaw is a 89 y.o. female history of A-fib, sick sinus syndrome, pacemaker who comes in with concerns for chest pain.  Patient comes in from [facility] for chest tightness and weakness..  Patient also reports a nosebleed at 4 PM this evening.  Patient reports having some chest pressure that is nonradiating into her back does not radiate into her abdomen.  She reports that it is feels like a tightness sensation.  She reports that her blood pressure is a bit much higher than she typically runs.  She also reports having some intermittent nosebleeds.  She is on a blood thinner and she called her cardiologist to see if she should hold her blood thinner.  She denies any syncope, lightheadedness, passing out.  Denies any falls or hitting her head.  She states that she does get some chest pressure and this has been going on intermittently for years now.  She states that she has episodes multiple times a week.  She reports that she is talk to her cardiologist about it and they did not feel like it was cardiac in nature.  She reports that her chest pressure seems similar to prior however given she was having the chest pressure and the nosebleed she went to come to the ER to be evaluated.  I reviewed the visit from Dr. Florencio from [facility] where she has had cardioversion for her A-fib   Physical Exam   Triage Vital Signs: ED Triage Vitals  Encounter Vitals Group     BP 07/10/24 2033 (!) 189/99     Girls Systolic BP Percentile --      Girls Diastolic BP Percentile --      Boys Systolic BP Percentile --      Boys Diastolic BP Percentile --      Pulse Rate 07/10/24 2033 65     Resp 07/10/24 2033 20     Temp 07/10/24 2033 97.8 F (36.6 C)     Temp Source 07/10/24 2033 Oral     SpO2  07/10/24 2033 100 %     Weight 07/10/24 2030 157 lb (71.2 kg)     Height 07/10/24 2030 5' 2 (1.575 m)     Head Circumference --      Peak Flow --      Pain Score 07/10/24 2029 3     Pain Loc --      Pain Education --      Exclude from Growth Chart --     Most recent vital signs: Vitals:   07/10/24 2033  BP: (!) 189/99  Pulse: 65  Resp: 20  Temp: 97.8 F (36.6 C)  SpO2: 100%     General: Awake, no distress.  CV:  Good peripheral perfusion.  Resp:  Normal effort.  Abd:  No distention.  Soft and nontender Other:  No swelling in legs.  No calf tenderness ICD palpated No active nosebleed Cranial nerves 2 through 12 are intact.  Equal strength in arms and legs.  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 ED Results / Procedures / Treatments   Labs (all labs ordered are listed, but only abnormal results are displayed) Labs Reviewed  COMPREHENSIVE METABOLIC PANEL WITH GFR - Abnormal; Notable for  the following components:      Result Value   Potassium 3.3 (*)    Glucose, Bld 107 (*)    BUN 24 (*)    Creatinine, Ser 1.03 (*)    Calcium 10.4 (*)    GFR, Estimated 53 (*)    All other components within normal limits  CBC WITH DIFFERENTIAL/PLATELET  TROPONIN I (HIGH SENSITIVITY)  TROPONIN I (HIGH SENSITIVITY)     EKG  My interpretation of EKG:  Paced rhythm with a rate of 60 without any ST elevation or T wave inversions, looks similar to her prior EKGs although previously she had T wave inversions in the inferolateral leads and these seem to have resolved today  RADIOLOGY I have reviewed the xray personally and interpreted no obvious pneumonia.  Does look a little bit of like a widened mediastinum.  However the read from radiology was that that it looked essentially waited secondary to patient rotation.   PROCEDURES:  Critical Care performed: No  .1-3 Lead EKG Interpretation  Performed by: Ernest Ronal BRAVO, MD Authorized by: Ernest Ronal BRAVO, MD      Interpretation: normal     ECG rate:  60   ECG rate assessment: normal     Rhythm: sinus rhythm     Ectopy: none     Conduction: normal      MEDICATIONS ORDERED IN ED: Medications  oxymetazoline  (AFRIN) 0.05 % nasal spray 1 spray (has no administration in time range)  nitroGLYCERIN  (NITROSTAT ) SL tablet 0.4 mg (0.4 mg Sublingual Given 07/10/24 2121)  iohexol  (OMNIPAQUE ) 350 MG/ML injection 100 mL (100 mLs Intravenous Contrast Given 07/10/24 2219)     IMPRESSION / MDM / ASSESSMENT AND PLAN / ED COURSE  I reviewed the triage vital signs and the nursing notes.   Patient's presentation is most consistent with acute presentation with potential threat to life or bodily function.   Patient comes in with concerns for chest pain.  Patient was given some nitro.  Her x-ray looks like a little bit of a widened mediastinum send to get a CT dissection.  She denies any falls hitting her head or other concerns.  She is neurologically intact.  She denies any headaches.  For her nosebleed she is not actively bleeding now.  We discussed Afrin to use only with the nosebleed and nasal clamp.  If this does not stop the bleed at home that she can return to the ER for packing but no indication for packing at this time.  We discussed the risk for stroke if holding her Eliquis and she is going to proceed for now.  Have also given her ENT number for follow-up.  Troponin negative.  CBC reassuring CMP shows slightly elevated creatinine and potassium  Given she reports that her chest pain is similar to what she has had previously and cardiology has been aware of it I do not feel a patient will require admission to the hospital because this does not sound like unstable angina however I did recommend that she follow-up with her cardiologist outpatient and she expressed understanding and felt comfortable with that plan.  Patient be handed off to oncoming team pending repeat troponin and suspect discharge home.  The  patient is on the cardiac monitor to evaluate for evidence of arrhythmia and/or significant heart rate changes.      FINAL CLINICAL IMPRESSION(S) / ED DIAGNOSES   Final diagnoses:  Chest pain, unspecified type  Bleeding nose     Rx / DC Orders  ED Discharge Orders     None        Note:  This document was prepared using Dragon voice recognition software and may include unintentional dictation errors.   Ernest Ronal BRAVO, MD 07/10/24 2328

## 2024-07-11 MED ORDER — AMOXICILLIN-POT CLAVULANATE 875-125 MG PO TABS
1.0000 | ORAL_TABLET | Freq: Once | ORAL | Status: AC
  Administered 2024-07-11: 1 via ORAL
  Filled 2024-07-11: qty 1

## 2024-07-11 MED ORDER — BACITRACIN ZINC 500 UNIT/GM EX OINT
TOPICAL_OINTMENT | CUTANEOUS | Status: AC
  Filled 2024-07-11: qty 0.9

## 2024-07-11 MED ORDER — AMOXICILLIN-POT CLAVULANATE 875-125 MG PO TABS
1.0000 | ORAL_TABLET | Freq: Two times a day (BID) | ORAL | 0 refills | Status: AC
Start: 2024-07-11 — End: 2024-07-18

## 2024-07-11 MED ORDER — CLOTRIMAZOLE 1 % EX CREA: TOPICAL_CREAM | Freq: Two times a day (BID) | CUTANEOUS | Status: DC

## 2024-07-11 MED ORDER — CLOTRIMAZOLE 1 % EX CREA: 1.0000 | TOPICAL_CREAM | Freq: Two times a day (BID) | CUTANEOUS | 0 refills | Status: AC

## 2024-07-11 MED ORDER — NITROGLYCERIN 0.4 MG SL SUBL
0.4000 mg | SUBLINGUAL_TABLET | SUBLINGUAL | Status: DC | PRN
  Administered 2024-07-11: 0.4 mg via SUBLINGUAL
  Filled 2024-07-11: qty 1

## 2024-07-11 NOTE — ED Provider Notes (Signed)
 Bleeding recurred so anterior packing placed and antibiotics started.  Will follow-up with ENT for removal.  Stable for discharge.  PROCEDURES:   Epistaxis Management  Date/Time: 07/11/2024 2:28 AM  Performed by: Cyrena Mylar, MD Authorized by: Cyrena Mylar, MD   Consent:    Consent obtained:  Verbal   Consent given by:  Patient   Risks discussed:  Infection and bleeding   Alternatives discussed:  No treatment and delayed treatment Universal protocol:    Procedure explained and questions answered to patient or proxy's satisfaction: yes     Patient identity confirmed:  Verbally with patient Anesthesia:    Anesthesia method:  None Procedure details:    Treatment site:  L anterior   Treatment method:  Anterior pack   Treatment complexity:  Limited   Treatment episode: initial   Post-procedure details:    Assessment:  Bleeding stopped   Procedure completion:  Quinton Cyrena Mylar, MD 07/11/24 [phone]

## 2024-08-05 ENCOUNTER — Ambulatory Visit
Admission: RE | Admit: 2024-08-05 | Discharge: 2024-08-05 | Disposition: A | Discharge status: Home / Self Care | Source: Ambulatory Visit | Attending: Family Medicine | Admitting: Family Medicine

## 2024-08-05 DIAGNOSIS — Z1231 Encounter for screening mammogram for malignant neoplasm of breast: Secondary | ICD-10-CM | POA: Insufficient documentation
